# Patient Record
Sex: Female | Born: 1975 | Race: Black or African American | Hispanic: No | Smoking: Current every day smoker
Health system: Southern US, Community
[De-identification: ages and names within clinical notes are randomized; demographics above are authoritative.]

## PROBLEM LIST (undated history)

## (undated) DIAGNOSIS — Z8711 Personal history of peptic ulcer disease: Secondary | ICD-10-CM

## (undated) DIAGNOSIS — I639 Cerebral infarction, unspecified: Secondary | ICD-10-CM

## (undated) DIAGNOSIS — K219 Gastro-esophageal reflux disease without esophagitis: Secondary | ICD-10-CM

## (undated) DIAGNOSIS — G510 Bell's palsy: Secondary | ICD-10-CM

## (undated) DIAGNOSIS — U071 COVID-19: Secondary | ICD-10-CM

## (undated) HISTORY — PX: ABDOMINAL SURGERY: SHX537

---

## 2015-10-01 DIAGNOSIS — K922 Gastrointestinal hemorrhage, unspecified: Secondary | ICD-10-CM | POA: Diagnosis present

## 2020-04-02 ENCOUNTER — Encounter (HOSPITAL_COMMUNITY): Payer: Self-pay | Admitting: *Deleted

## 2020-04-02 ENCOUNTER — Inpatient Hospital Stay (HOSPITAL_COMMUNITY)
Admission: EM | Admit: 2020-04-02 | Discharge: 2020-04-04 | DRG: 384 | Discharge status: Against Medical Advice | Payer: Medicaid Other | Attending: Internal Medicine | Admitting: Internal Medicine

## 2020-04-02 ENCOUNTER — Other Ambulatory Visit: Payer: Self-pay

## 2020-04-02 ENCOUNTER — Emergency Department (HOSPITAL_COMMUNITY): Payer: Medicaid Other

## 2020-04-02 DIAGNOSIS — E876 Hypokalemia: Secondary | ICD-10-CM | POA: Diagnosis present

## 2020-04-02 DIAGNOSIS — Z79899 Other long term (current) drug therapy: Secondary | ICD-10-CM

## 2020-04-02 DIAGNOSIS — D539 Nutritional anemia, unspecified: Secondary | ICD-10-CM | POA: Diagnosis present

## 2020-04-02 DIAGNOSIS — K219 Gastro-esophageal reflux disease without esophagitis: Secondary | ICD-10-CM | POA: Diagnosis present

## 2020-04-02 DIAGNOSIS — E871 Hypo-osmolality and hyponatremia: Secondary | ICD-10-CM | POA: Diagnosis present

## 2020-04-02 DIAGNOSIS — K922 Gastrointestinal hemorrhage, unspecified: Secondary | ICD-10-CM | POA: Diagnosis present

## 2020-04-02 DIAGNOSIS — E869 Volume depletion, unspecified: Secondary | ICD-10-CM | POA: Diagnosis present

## 2020-04-02 DIAGNOSIS — Z7982 Long term (current) use of aspirin: Secondary | ICD-10-CM

## 2020-04-02 DIAGNOSIS — R531 Weakness: Secondary | ICD-10-CM

## 2020-04-02 DIAGNOSIS — Z8616 Personal history of COVID-19: Secondary | ICD-10-CM

## 2020-04-02 DIAGNOSIS — R112 Nausea with vomiting, unspecified: Secondary | ICD-10-CM

## 2020-04-02 DIAGNOSIS — F1410 Cocaine abuse, uncomplicated: Secondary | ICD-10-CM | POA: Diagnosis present

## 2020-04-02 DIAGNOSIS — Z8673 Personal history of transient ischemic attack (TIA), and cerebral infarction without residual deficits: Secondary | ICD-10-CM

## 2020-04-02 DIAGNOSIS — G510 Bell's palsy: Secondary | ICD-10-CM

## 2020-04-02 DIAGNOSIS — F17210 Nicotine dependence, cigarettes, uncomplicated: Secondary | ICD-10-CM | POA: Diagnosis present

## 2020-04-02 DIAGNOSIS — I639 Cerebral infarction, unspecified: Secondary | ICD-10-CM | POA: Diagnosis present

## 2020-04-02 DIAGNOSIS — Z791 Long term (current) use of non-steroidal anti-inflammatories (NSAID): Secondary | ICD-10-CM

## 2020-04-02 DIAGNOSIS — E873 Alkalosis: Secondary | ICD-10-CM | POA: Diagnosis present

## 2020-04-02 DIAGNOSIS — K259 Gastric ulcer, unspecified as acute or chronic, without hemorrhage or perforation: Principal | ICD-10-CM | POA: Diagnosis present

## 2020-04-02 DIAGNOSIS — R059 Cough, unspecified: Secondary | ICD-10-CM

## 2020-04-02 HISTORY — DX: Personal history of peptic ulcer disease: Z87.11

## 2020-04-02 HISTORY — DX: COVID-19: U07.1

## 2020-04-02 HISTORY — DX: Cerebral infarction, unspecified: I63.9

## 2020-04-02 HISTORY — DX: Gastro-esophageal reflux disease without esophagitis: K21.9

## 2020-04-02 HISTORY — DX: Bell's palsy: G51.0

## 2020-04-02 LAB — CBC WITH DIFFERENTIAL/PLATELET
Abs Immature Granulocytes: 0.04 10*3/uL (ref 0.00–0.07)
Basophils Absolute: 0 10*3/uL (ref 0.0–0.1)
Basophils Relative: 0 %
Eosinophils Absolute: 0.2 10*3/uL (ref 0.0–0.5)
Eosinophils Relative: 2 %
HCT: 45.4 % (ref 36.0–46.0)
Hemoglobin: 15.4 g/dL — ABNORMAL HIGH (ref 12.0–15.0)
Immature Granulocytes: 0 %
Lymphocytes Relative: 21 %
Lymphs Abs: 2 10*3/uL (ref 0.7–4.0)
MCH: 33.3 pg (ref 26.0–34.0)
MCHC: 33.9 g/dL (ref 30.0–36.0)
MCV: 98.1 fL (ref 80.0–100.0)
Monocytes Absolute: 1 10*3/uL (ref 0.1–1.0)
Monocytes Relative: 11 %
Neutro Abs: 6.2 10*3/uL (ref 1.7–7.7)
Neutrophils Relative %: 66 %
Platelets: 294 10*3/uL (ref 150–400)
RBC: 4.63 MIL/uL (ref 3.87–5.11)
RDW: 12.8 % (ref 11.5–15.5)
WBC: 9.5 10*3/uL (ref 4.0–10.5)
nRBC: 0 % (ref 0.0–0.2)

## 2020-04-02 LAB — COMPREHENSIVE METABOLIC PANEL
ALT: 13 U/L (ref 0–44)
AST: 19 U/L (ref 15–41)
Albumin: 4 g/dL (ref 3.5–5.0)
Alkaline Phosphatase: 119 U/L (ref 38–126)
Anion gap: 16 — ABNORMAL HIGH (ref 5–15)
BUN: 24 mg/dL — ABNORMAL HIGH (ref 6–20)
CO2: 39 mmol/L — ABNORMAL HIGH (ref 22–32)
Calcium: 9.7 mg/dL (ref 8.9–10.3)
Chloride: 78 mmol/L — ABNORMAL LOW (ref 98–111)
Creatinine, Ser: 1.34 mg/dL — ABNORMAL HIGH (ref 0.44–1.00)
GFR, Estimated: 50 mL/min — ABNORMAL LOW (ref >60)
Glucose, Bld: 130 mg/dL — ABNORMAL HIGH (ref 70–99)
Potassium: 2.2 mmol/L — CL (ref 3.5–5.1)
Sodium: 133 mmol/L — ABNORMAL LOW (ref 135–145)
Total Bilirubin: 0.2 mg/dL — ABNORMAL LOW (ref 0.3–1.2)
Total Protein: 8.2 g/dL — ABNORMAL HIGH (ref 6.5–8.1)

## 2020-04-02 LAB — MAGNESIUM: Magnesium: 1.7 mg/dL (ref 1.7–2.4)

## 2020-04-02 LAB — BASIC METABOLIC PANEL
Anion gap: 12 (ref 5–15)
BUN: 25 mg/dL — ABNORMAL HIGH (ref 6–20)
CO2: 38 mmol/L — ABNORMAL HIGH (ref 22–32)
Calcium: 9.4 mg/dL (ref 8.9–10.3)
Chloride: 83 mmol/L — ABNORMAL LOW (ref 98–111)
Creatinine, Ser: 1.29 mg/dL — ABNORMAL HIGH (ref 0.44–1.00)
GFR, Estimated: 52 mL/min — ABNORMAL LOW (ref >60)
Glucose, Bld: 100 mg/dL — ABNORMAL HIGH (ref 70–99)
Potassium: 3 mmol/L — ABNORMAL LOW (ref 3.5–5.1)
Sodium: 133 mmol/L — ABNORMAL LOW (ref 135–145)

## 2020-04-02 LAB — RESP PANEL BY RT-PCR (FLU A&B, COVID) ARPGX2
Influenza A by PCR: NEGATIVE
Influenza B by PCR: NEGATIVE
SARS Coronavirus 2 by RT PCR: NEGATIVE

## 2020-04-02 LAB — TROPONIN I (HIGH SENSITIVITY)
Troponin I (High Sensitivity): 4 ng/L (ref <18)
Troponin I (High Sensitivity): 3 ng/L (ref <18)

## 2020-04-02 MED ORDER — POTASSIUM CHLORIDE 10 MEQ/100ML IV SOLN
10.0000 meq | INTRAVENOUS | Status: AC
  Administered 2020-04-02 (×3): 10 meq via INTRAVENOUS
  Filled 2020-04-02 (×3): qty 100

## 2020-04-02 MED ORDER — LIDOCAINE VISCOUS HCL 2 % MT SOLN
15.0000 mL | Freq: Once | OROMUCOSAL | Status: AC
  Administered 2020-04-02: 15 mL via ORAL
  Filled 2020-04-02: qty 15

## 2020-04-02 MED ORDER — DICYCLOMINE HCL 10 MG PO CAPS
10.0000 mg | ORAL_CAPSULE | Freq: Once | ORAL | Status: AC
  Administered 2020-04-02: 10 mg via ORAL
  Filled 2020-04-02: qty 1

## 2020-04-02 MED ORDER — HYOSCYAMINE SULFATE 0.125 MG SL SUBL
0.2500 mg | SUBLINGUAL_TABLET | Freq: Once | SUBLINGUAL | Status: AC
  Administered 2020-04-02: 0.25 mg via SUBLINGUAL
  Filled 2020-04-02: qty 2

## 2020-04-02 MED ORDER — PANTOPRAZOLE SODIUM 40 MG IV SOLR
40.0000 mg | Freq: Two times a day (BID) | INTRAVENOUS | Status: DC
  Administered 2020-04-02 – 2020-04-04 (×4): 40 mg via INTRAVENOUS
  Filled 2020-04-02 (×4): qty 40

## 2020-04-02 MED ORDER — SODIUM CHLORIDE 0.9 % IV SOLN: INTRAVENOUS | Status: DC

## 2020-04-02 MED ORDER — ONDANSETRON HCL 4 MG/2ML IJ SOLN
4.0000 mg | Freq: Once | INTRAMUSCULAR | Status: AC
  Administered 2020-04-02: 4 mg via INTRAVENOUS
  Filled 2020-04-02: qty 2

## 2020-04-02 MED ORDER — ALUM & MAG HYDROXIDE-SIMETH 200-200-20 MG/5ML PO SUSP
30.0000 mL | Freq: Once | ORAL | Status: AC
  Administered 2020-04-02: 30 mL via ORAL
  Filled 2020-04-02: qty 30

## 2020-04-02 MED ORDER — POTASSIUM CHLORIDE CRYS ER 20 MEQ PO TBCR
40.0000 meq | EXTENDED_RELEASE_TABLET | Freq: Once | ORAL | Status: AC
  Administered 2020-04-02: 40 meq via ORAL
  Filled 2020-04-02: qty 2

## 2020-04-02 MED ORDER — ONDANSETRON HCL 4 MG/2ML IJ SOLN
4.0000 mg | Freq: Four times a day (QID) | INTRAMUSCULAR | Status: DC | PRN
  Administered 2020-04-02 – 2020-04-04 (×5): 4 mg via INTRAVENOUS
  Filled 2020-04-02 (×6): qty 2

## 2020-04-02 MED ORDER — ONDANSETRON HCL 4 MG PO TABS: 4.0000 mg | ORAL_TABLET | Freq: Four times a day (QID) | ORAL | Status: DC | PRN

## 2020-04-02 MED ORDER — PANTOPRAZOLE SODIUM 40 MG IV SOLR
40.0000 mg | Freq: Once | INTRAVENOUS | Status: AC
  Administered 2020-04-02: 40 mg via INTRAVENOUS
  Filled 2020-04-02: qty 40

## 2020-04-02 MED ORDER — DICYCLOMINE HCL 10 MG/5ML PO SOLN
10.0000 mg | Freq: Once | ORAL | Status: DC
  Filled 2020-04-02: qty 5

## 2020-04-02 MED ORDER — IOHEXOL 9 MG/ML PO SOLN
ORAL | Status: AC
  Filled 2020-04-02: qty 1000

## 2020-04-02 MED ORDER — PROMETHAZINE HCL 25 MG/ML IJ SOLN
25.0000 mg | Freq: Once | INTRAMUSCULAR | Status: AC
  Administered 2020-04-02: 25 mg via INTRAVENOUS
  Filled 2020-04-02: qty 1

## 2020-04-02 MED ORDER — ENOXAPARIN SODIUM 40 MG/0.4ML ~~LOC~~ SOLN: 40.0000 mg | SUBCUTANEOUS | Status: DC

## 2020-04-02 NOTE — H&P (Signed)
History and Physical  Kristina Cardenas GYJ:[id number] DOB: [birth year] DOA: 04/02/2020  Referring physician: Trisha Mangle, PA-C, ED provider PCP: Patient, No Pcp Per  Outpatient Specialists:   Patient Coming From: home  Chief Complaint: weakness, vomiting  HPI: Kristina Cardenas is a 44 y.o. female with a history of Stroke, PUD. Presents with generalized weakness, abdominal pain in the epigastric region that radiates into her back.  No palliating or provoking factors.  She has been vomiting for the past few days with the inability to take oral food or liquid.  Symptoms are worsening.  Emergency Department Course: Chest x-ray negative.  Potassium 2.2.  Magnesium normal.  Metabolic alkalosis.  White count normal  Review of Systems:   Pt denies any fevers, chills, nausea, vomiting, diarrhea, constipation, abdominal pain, shortness of breath, dyspnea on exertion, orthopnea, cough, wheezing, palpitations, headache, vision changes, lightheadedness, dizziness, melena, rectal bleeding.  Review of systems are otherwise negative  Past Medical History:  Diagnosis Date  . Bell's palsy   . COVID   . GERD (gastroesophageal reflux disease)   . History of stomach ulcers   . Stroke [facility])    Past Surgical History:  Procedure Laterality Date  . ABDOMINAL SURGERY     Social History:  reports that she has been smoking cigarettes. She has been smoking about 1.00 pack per day. She has never used smokeless tobacco. She reports previous alcohol use. She reports previous drug use. Patient lives at home  Allergies  Allergen Reactions  . Sulfa Antibiotics   . Toradol [Ketorolac Tromethamine]     No family history on file.    Prior to Admission medications   Not on File    Physical Exam: BP 108/84   Pulse 86   Temp 98.4 F (36.9 C) (Oral)   Resp (!) 21   Ht 5\' 8"  (1.727 m)   Wt 77.1 kg   LMP 03/12/2020   SpO2 100%   BMI 25.85 kg/m   . General: Middle-age female. Awake and alert and  oriented x3. No acute cardiopulmonary distress.  13/03/2020 HEENT: Normocephalic atraumatic.  Right and left ears normal in appearance.  Pupils equal, round, reactive to light. Extraocular muscles are intact. Sclerae anicteric and noninjected.  Moist mucosal membranes. No mucosal lesions.  . Neck: Neck supple without lymphadenopathy. No carotid bruits. No masses palpated.  . Cardiovascular: Regular rate with normal S1-S2 sounds. No murmurs, rubs, gallops auscultated. No JVD.  Marland Kitchen Respiratory: Good respiratory effort with no wheezes, rales, rhonchi. Lungs clear to auscultation bilaterally.  No accessory muscle use. . Abdomen: Soft, tender in the epigastric region or guarding, nondistended. Active bowel sounds. No masses or hepatosplenomegaly  . Skin: No rashes, lesions, or ulcerations.  Dry, warm to touch. 2+ dorsalis pedis and radial pulses. . Musculoskeletal: No calf or leg pain. All major joints not erythematous nontender.  No upper or lower joint deformation.  Good ROM.  No contractures  . Psychiatric: Intact judgment and insight. Pleasant and cooperative. . Neurologic: No focal neurological deficits. Strength is 5/5 and symmetric in upper and lower extremities.  Cranial nerves II through XII are grossly intact.           Labs on Admission: I have personally reviewed following labs and imaging studies  CBC: Recent Labs  Lab 04/02/20 1440  WBC 9.5  NEUTROABS 6.2  HGB 15.4*  HCT 45.4  MCV 98.1  PLT 294   Basic Metabolic Panel: Recent Labs  Lab 04/02/20 1440  NA 133*  K 2.2*  CL 78*  CO2 39*  GLUCOSE 130*  BUN 24*  CREATININE 1.34*  CALCIUM 9.7   GFR: Estimated Creatinine Clearance: 57.9 mL/min (A) (by C-G formula based on SCr of 1.34 mg/dL (H)). Liver Function Tests: Recent Labs  Lab 04/02/20 1440  AST 19  ALT 13  ALKPHOS 119  BILITOT 0.2*  PROT 8.2*  ALBUMIN 4.0   No results for input(s): LIPASE, AMYLASE in the last 168 hours. No results for input(s): AMMONIA in the last  168 hours. Coagulation Profile: No results for input(s): INR, PROTIME in the last 168 hours. Cardiac Enzymes: No results for input(s): CKTOTAL, CKMB, CKMBINDEX, TROPONINI in the last 168 hours. BNP (last 3 results) No results for input(s): PROBNP in the last 8760 hours. HbA1C: No results for input(s): HGBA1C in the last 72 hours. CBG: No results for input(s): GLUCAP in the last 168 hours. Lipid Profile: No results for input(s): CHOL, HDL, LDLCALC, TRIG, CHOLHDL, LDLDIRECT in the last 72 hours. Thyroid Function Tests: No results for input(s): TSH, T4TOTAL, FREET4, T3FREE, THYROIDAB in the last 72 hours. Anemia Panel: No results for input(s): VITAMINB12, FOLATE, FERRITIN, TIBC, IRON, RETICCTPCT in the last 72 hours. Urine analysis: No results found for: COLORURINE, APPEARANCEUR, LABSPEC, PHURINE, GLUCOSEU, HGBUR, BILIRUBINUR, KETONESUR, PROTEINUR, UROBILINOGEN, NITRITE, LEUKOCYTESUR Sepsis Labs: @LABRCNTIP (procalcitonin:4,lacticidven:4) ) Recent Results (from the past 240 hour(s))  Resp Panel by RT-PCR (Flu A&B, Covid) Nasopharyngeal Swab     Status: None   Collection Time: 04/02/20  2:40 PM   Specimen: Nasopharyngeal Swab; Nasopharyngeal(NP) swabs in vial transport medium  Result Value Ref Range Status   SARS Coronavirus 2 by RT PCR NEGATIVE NEGATIVE Final   Influenza A by PCR NEGATIVE NEGATIVE Final   Influenza B by PCR NEGATIVE NEGATIVE Final    Comment: Performed at [facility], [address], [place], [place]     Radiological Exams on Admission: DG Chest Port 1 View  Result Date: 04/02/2020 CLINICAL DATA:  Cough, abdominal pain, emesis EXAM: PORTABLE CHEST 1 VIEW COMPARISON:  None. FINDINGS: Single frontal view of the chest demonstrates an unremarkable cardiac silhouette. No airspace disease, effusion, or pneumothorax. Prior healed right rib fractures. Rounded metallic densities projecting over the left upper quadrant abdomen are nonspecific, and may lie on or  beneath the patient. IMPRESSION: 1. No acute intrathoracic process. Electronically Signed   By: 14/06/2019 M.D.   On: 04/02/2020 15:23    EKG: Independently reviewed.  Sinus rhythm with Q waves in the anterior leads.  No acute ST changes  Assessment/Plan: Principal Problem:   Acute hypokalemia Active Problems:   Bell's palsy   Stroke (HCC)   Gastroesophageal reflux disease   History of Upper GI bleed   Metabolic alkalosis    This patient was discussed with the ED physician, including pertinent vitals, physical exam findings, labs, and imaging.  We also discussed care given by the ED provider.  1. Acute hypokalemia secondary to nausea and vomiting a. We will replace potassium b. Observation c. Check potassium in the morning 2. History of stroke a.  3. History of upper GI bleed and GERD a. Protonix IV b. Clear fluids c. CT abdomen pelvis pending 4. Metabolic alkalosis secondary to nausea and vomiting a. IV hydration b. Check BMP c. Clear fluids as tolerated d. Zofran  DVT prophylaxis: Lovenox Consultants: None Code Status: Full code Family Communication: None Disposition Plan: Patient to return home following resolution   14/06/2019, DO

## 2020-04-02 NOTE — ED Provider Notes (Signed)
Received signout at beginning of shift, please see previous providers note for complete H&P. This is a 44 year old female with history of stomach ulcer who presents with abdominal pain. Her labs remarkable for hypokalemia with potassium of 2.2 currently receiving replenishment. Evidence of AKI with BUN 24, creatinine of 1.34. Chest x-ray unremarkable, Covid test is negative. Patient has been consulted by [facility] hospitalist and likely will need to be admitted, Dr. Adrian Blackwater request for an abdominal pelvic CT scan to be performed as patient has prior stomach perforation. We will follow up on CT results check to make sure patient is admitted appropriately.  8:48 PM Pt is being admitted by medicine  BP 115/85   Pulse 70   Temp 98.4 F (36.9 C) (Oral)   Resp 19   Ht 5\' 8"  (1.727 m)   Wt 77.1 kg   LMP 03/12/2020   SpO2 99%   BMI 25.85 kg/m   Results for orders placed or performed during the hospital encounter of 04/02/20  Resp Panel by RT-PCR (Flu A&B, Covid) Nasopharyngeal Swab   Specimen: Nasopharyngeal Swab; Nasopharyngeal(NP) swabs in vial transport medium  Result Value Ref Range   SARS Coronavirus 2 by RT PCR NEGATIVE NEGATIVE   Influenza A by PCR NEGATIVE NEGATIVE   Influenza B by PCR NEGATIVE NEGATIVE  CBC with Differential/Platelet  Result Value Ref Range   WBC 9.5 4.0 - 10.5 K/uL   RBC 4.63 3.87 - 5.11 MIL/uL   Hemoglobin 15.4 (H) 12.0 - 15.0 g/dL   HCT 14/03/21 36 - 46 %   MCV 98.1 80.0 - 100.0 fL   MCH 33.3 26.0 - 34.0 pg   MCHC 33.9 30.0 - 36.0 g/dL   RDW 59.1 63.8 - 46.6 %   Platelets 294 150 - 400 K/uL   nRBC 0.0 0.0 - 0.2 %   Neutrophils Relative % 66 %   Neutro Abs 6.2 1.7 - 7.7 K/uL   Lymphocytes Relative 21 %   Lymphs Abs 2.0 0.7 - 4.0 K/uL   Monocytes Relative 11 %   Monocytes Absolute 1.0 0.1 - 1.0 K/uL   Eosinophils Relative 2 %   Eosinophils Absolute 0.2 0.0 - 0.5 K/uL   Basophils Relative 0 %   Basophils Absolute 0.0 0.0 - 0.1 K/uL   Immature Granulocytes 0 %    Abs Immature Granulocytes 0.04 0.00 - 0.07 K/uL  Comprehensive metabolic panel  Result Value Ref Range   Sodium 133 (L) 135 - 145 mmol/L   Potassium 2.2 (LL) 3.5 - 5.1 mmol/L   Chloride 78 (L) 98 - 111 mmol/L   CO2 39 (H) 22 - 32 mmol/L   Glucose, Bld 130 (H) 70 - 99 mg/dL   BUN 24 (H) 6 - 20 mg/dL   Creatinine, Ser 59.9 (H) 0.44 - 1.00 mg/dL   Calcium 9.7 8.9 - 3.57 mg/dL   Total Protein 8.2 (H) 6.5 - 8.1 g/dL   Albumin 4.0 3.5 - 5.0 g/dL   AST 19 15 - 41 U/L   ALT 13 0 - 44 U/L   Alkaline Phosphatase 119 38 - 126 U/L   Total Bilirubin 0.2 (L) 0.3 - 1.2 mg/dL   GFR, Estimated 50 (L) >60 mL/min   Anion gap 16 (H) 5 - 15  Magnesium  Result Value Ref Range   Magnesium 1.7 1.7 - 2.4 mg/dL  Troponin I (High Sensitivity)  Result Value Ref Range   Troponin I (High Sensitivity) 4 <18 ng/L  Troponin I (High Sensitivity)  Result Value  Ref Range   Troponin I (High Sensitivity) 3 <18 ng/L   DG Chest Port 1 View  Result Date: 04/02/2020 CLINICAL DATA:  Cough, abdominal pain, emesis EXAM: PORTABLE CHEST 1 VIEW COMPARISON:  None. FINDINGS: Single frontal view of the chest demonstrates an unremarkable cardiac silhouette. No airspace disease, effusion, or pneumothorax. Prior healed right rib fractures. Rounded metallic densities projecting over the left upper quadrant abdomen are nonspecific, and may lie on or beneath the patient. IMPRESSION: 1. No acute intrathoracic process. Electronically Signed   By: Sharlet Salina M.D.   On: 04/02/2020 15:23      Fayrene Helper, PA-C 04/02/20 2048    Benjiman Core, MD 04/03/20 0030

## 2020-04-02 NOTE — ED Notes (Addendum)
This RN spoke to [place], patient's significant other. This RN was educated by staff that Loraine Leriche persistently calls department requesting an update on plan of care. While this RN was speaking with Loraine Leriche, this RN educated that patient was currently sleeping and visitors are not currently allowed due to pending COVID test. Loraine Leriche became upset and began to state " No. What y'all need to do is check out her stomach and run some labs to figure out what's going on. She doesn't have COVID cause I've been around here. I know what COVID is." This RN attempted to provide an update when Loraine Leriche began more irate and stated "I'm telling you what you need to do. And I don't know why you aren't bothering to listen. I know her and I know exactly what's wrong with her." This RN then educated that no further updates will be provided should he continue to be aggressive over the phone. Mark then hung up phone and pt educated that he called and to provide an update at patient request.

## 2020-04-02 NOTE — ED Triage Notes (Signed)
Multiple complaints, states she feels like she has pneumonia. C/o abdominal pain and emesis, out of protonix for weeks

## 2020-04-02 NOTE — ED Notes (Signed)
Entered room and introduced self to patient. Pt appears to be resting in bed, respirations are even and unlabored with equal chest rise and fall. Bed is locked in the lowest position, side rails x2, call bell within reach. Pt educated on call light use and hourly rounding, verbalized understanding and in agreement at this time. All questions and concerns voiced addressed. Refreshments offered and provided per patient request.  Will continue to monitor.   

## 2020-04-02 NOTE — ED Provider Notes (Signed)
[facility] EMERGENCY DEPARTMENT Provider Note   CSN: [id number] Arrival date & time: 04/02/20  1258     History Chief Complaint  Patient presents with  . Abdominal Pain  . Cough    Kristina Cardenas is a 44 y.o. female.  The history is provided by the patient. No language interpreter was used.  Cough Cough characteristics:  Non-productive Sputum characteristics:  Nondescript Severity:  Moderate Onset quality:  Gradual Timing:  Constant Progression:  Worsening Chronicity:  New Relieved by:  Nothing Ineffective treatments:  None tried Associated symptoms: shortness of breath   Risk factors: no recent infection   Pt complains of epigastric discomfort and cough.  Pt reports she has an ulcer and has had perforated ulcers in the past.  Pt reports she is out of protonix.  Pt complains of feeling tired.  Pt reports she felt like this the last time she had pneumonia.  Pt reports she had covid a year ago.  Pt reports she has been vomiting.  Pt reports not eating.     Past Medical History:  Diagnosis Date  . Bell's palsy   . COVID   . History of stomach ulcers   . Stroke [facility])     Patient Active Problem List   Diagnosis Date Noted  . Bell's palsy     Past Surgical History:  Procedure Laterality Date  . ABDOMINAL SURGERY       OB History   No obstetric history on file.     No family history on file.  Social History   Tobacco Use  . Smoking status: Current Every Day Smoker    Packs/day: 1.00    Types: Cigarettes  . Smokeless tobacco: Never Used  Substance Use Topics  . Alcohol use: Not Currently  . Drug use: Not Currently    Home Medications Prior to Admission medications   Not on File    Allergies    Sulfa antibiotics and Toradol [ketorolac tromethamine]  Review of Systems   Review of Systems  Respiratory: Positive for cough and shortness of breath.   All other systems reviewed and are negative.   Physical Exam Updated Vital Signs BP 118/80 (BP  Location: Left Arm)   Pulse 83   Temp 98.4 F (36.9 C) (Oral)   Resp 19   Ht 5\' 8"  (1.727 m)   Wt 77.1 kg   LMP 03/12/2020   SpO2 97%   BMI 25.85 kg/m   Physical Exam Vitals and nursing note reviewed.  Constitutional:      Appearance: She is well-developed.  HENT:     Head: Normocephalic.  Cardiovascular:     Rate and Rhythm: Normal rate and regular rhythm.     Heart sounds: Normal heart sounds.  Pulmonary:     Effort: Pulmonary effort is normal.  Abdominal:     General: Abdomen is flat. Bowel sounds are normal. There is no distension.     Palpations: Abdomen is soft.     Tenderness: There is abdominal tenderness in the epigastric area.  Musculoskeletal:        General: Normal range of motion.     Cervical back: Normal range of motion.  Skin:    General: Skin is warm.  Neurological:     General: No focal deficit present.     Mental Status: She is alert and oriented to person, place, and time.     ED Results / Procedures / Treatments   Labs (all labs ordered are listed, but only  abnormal results are displayed) Labs Reviewed  CBC WITH DIFFERENTIAL/PLATELET - Abnormal; Notable for the following components:      Result Value   Hemoglobin 15.4 (*)    All other components within normal limits  COMPREHENSIVE METABOLIC PANEL - Abnormal; Notable for the following components:   Sodium 133 (*)    Potassium 2.2 (*)    Chloride 78 (*)    CO2 39 (*)    Glucose, Bld 130 (*)    BUN 24 (*)    Creatinine, Ser 1.34 (*)    Total Protein 8.2 (*)    Total Bilirubin 0.2 (*)    GFR, Estimated 50 (*)    Anion gap 16 (*)    All other components within normal limits  RESP PANEL BY RT-PCR (FLU A&B, COVID) [id number]  TROPONIN I (HIGH SENSITIVITY)    EKG EKG Interpretation  Date/Time:  Friday April 02 2020 13:07:20 EST Ventricular Rate:  113 PR Interval:  144 QRS Duration: 96 QT Interval:  360 QTC Calculation: 493 R Axis:   81 Text Interpretation: Sinus tachycardia Cannot  rule out Anterior infarct , age undetermined ST & T wave abnormality, consider inferior ischemia Abnormal ECG No old tracing to compare Confirmed by Benjiman Core [phone]) on 04/02/2020 4:38:37 PM   Radiology DG Chest Port 1 View  Result Date: 04/02/2020 CLINICAL DATA:  Cough, abdominal pain, emesis EXAM: PORTABLE CHEST 1 VIEW COMPARISON:  None. FINDINGS: Single frontal view of the chest demonstrates an unremarkable cardiac silhouette. No airspace disease, effusion, or pneumothorax. Prior healed right rib fractures. Rounded metallic densities projecting over the left upper quadrant abdomen are nonspecific, and may lie on or beneath the patient. IMPRESSION: 1. No acute intrathoracic process. Electronically Signed   By: Sharlet Salina M.D.   On: 04/02/2020 15:23    Procedures Procedures (including critical care time)  Medications Ordered in ED Medications  potassium chloride 10 mEq in 100 mL IVPB (10 mEq Intravenous New Bag/Given 04/02/20 1658)  potassium chloride SA (KLOR-CON) CR tablet 40 mEq (40 mEq Oral Given 04/02/20 1701)  pantoprazole (PROTONIX) injection 40 mg (40 mg Intravenous Given 04/02/20 1638)  ondansetron (ZOFRAN) injection 4 mg (4 mg Intravenous Given 04/02/20 1650)    ED Course  I have reviewed the triage vital signs and the nursing notes.  Pertinent labs & imaging results that were available during my care of the patient were reviewed by me and considered in my medical decision making (see chart for details).    MDM Rules/Calculators/A&P                          MDM Pt request iv protonix and phenergan  I spoke to Dr. Adrian Blackwater who will admit.  Iv potassium ordered.  Ct abdomen and pelvis ordered  Final Clinical Impression(s) / ED Diagnoses Final diagnoses:  Hypokalemia  Weakness    Rx / DC Orders ED Discharge Orders    None       Osie Cheeks 04/02/20 Raina Mina, MD 04/03/20 [phone]

## 2020-04-02 NOTE — ED Notes (Signed)
Lab at bedside at this time.  

## 2020-04-02 NOTE — ED Notes (Signed)
Pt provided a pillow per request at this time.  X-Ray at bedside.

## 2020-04-02 NOTE — ED Notes (Signed)
ED Provider at bedside. 

## 2020-04-03 ENCOUNTER — Other Ambulatory Visit: Payer: Self-pay

## 2020-04-03 DIAGNOSIS — K253 Acute gastric ulcer without hemorrhage or perforation: Secondary | ICD-10-CM

## 2020-04-03 DIAGNOSIS — K219 Gastro-esophageal reflux disease without esophagitis: Secondary | ICD-10-CM

## 2020-04-03 DIAGNOSIS — R1013 Epigastric pain: Secondary | ICD-10-CM

## 2020-04-03 DIAGNOSIS — R112 Nausea with vomiting, unspecified: Secondary | ICD-10-CM | POA: Diagnosis present

## 2020-04-03 DIAGNOSIS — Z791 Long term (current) use of non-steroidal anti-inflammatories (NSAID): Secondary | ICD-10-CM | POA: Diagnosis not present

## 2020-04-03 DIAGNOSIS — K259 Gastric ulcer, unspecified as acute or chronic, without hemorrhage or perforation: Secondary | ICD-10-CM | POA: Diagnosis present

## 2020-04-03 DIAGNOSIS — Z8673 Personal history of transient ischemic attack (TIA), and cerebral infarction without residual deficits: Secondary | ICD-10-CM | POA: Diagnosis not present

## 2020-04-03 DIAGNOSIS — Z7982 Long term (current) use of aspirin: Secondary | ICD-10-CM | POA: Diagnosis not present

## 2020-04-03 DIAGNOSIS — D539 Nutritional anemia, unspecified: Secondary | ICD-10-CM | POA: Diagnosis present

## 2020-04-03 DIAGNOSIS — F17210 Nicotine dependence, cigarettes, uncomplicated: Secondary | ICD-10-CM | POA: Diagnosis present

## 2020-04-03 DIAGNOSIS — E873 Alkalosis: Secondary | ICD-10-CM | POA: Diagnosis present

## 2020-04-03 DIAGNOSIS — K2970 Gastritis, unspecified, without bleeding: Secondary | ICD-10-CM

## 2020-04-03 DIAGNOSIS — E876 Hypokalemia: Secondary | ICD-10-CM

## 2020-04-03 DIAGNOSIS — R1114 Bilious vomiting: Secondary | ICD-10-CM

## 2020-04-03 DIAGNOSIS — F1410 Cocaine abuse, uncomplicated: Secondary | ICD-10-CM | POA: Diagnosis present

## 2020-04-03 DIAGNOSIS — E871 Hypo-osmolality and hyponatremia: Secondary | ICD-10-CM | POA: Diagnosis present

## 2020-04-03 DIAGNOSIS — Z79899 Other long term (current) drug therapy: Secondary | ICD-10-CM | POA: Diagnosis not present

## 2020-04-03 DIAGNOSIS — K2990 Gastroduodenitis, unspecified, without bleeding: Secondary | ICD-10-CM

## 2020-04-03 DIAGNOSIS — E869 Volume depletion, unspecified: Secondary | ICD-10-CM | POA: Diagnosis present

## 2020-04-03 DIAGNOSIS — Z8616 Personal history of COVID-19: Secondary | ICD-10-CM | POA: Diagnosis not present

## 2020-04-03 LAB — HIV ANTIBODY (ROUTINE TESTING W REFLEX): HIV Screen 4th Generation wRfx: NONREACTIVE

## 2020-04-03 LAB — BASIC METABOLIC PANEL
Anion gap: 10 (ref 5–15)
Anion gap: 8 (ref 5–15)
BUN: 17 mg/dL (ref 6–20)
BUN: 13 mg/dL (ref 6–20)
CO2: 36 mmol/L — ABNORMAL HIGH (ref 22–32)
CO2: 29 mmol/L (ref 22–32)
Calcium: 8.7 mg/dL — ABNORMAL LOW (ref 8.9–10.3)
Calcium: 7.7 mg/dL — ABNORMAL LOW (ref 8.9–10.3)
Chloride: 89 mmol/L — ABNORMAL LOW (ref 98–111)
Chloride: 97 mmol/L — ABNORMAL LOW (ref 98–111)
Creatinine, Ser: 0.89 mg/dL (ref 0.44–1.00)
Creatinine, Ser: 0.75 mg/dL (ref 0.44–1.00)
GFR, Estimated: >60 mL/min (ref >60)
GFR, Estimated: >60 mL/min (ref >60)
Glucose, Bld: 102 mg/dL — ABNORMAL HIGH (ref 70–99)
Glucose, Bld: 117 mg/dL — ABNORMAL HIGH (ref 70–99)
Potassium: 2.1 mmol/L — CL (ref 3.5–5.1)
Potassium: 2.8 mmol/L — ABNORMAL LOW (ref 3.5–5.1)
Sodium: 135 mmol/L (ref 135–145)
Sodium: 134 mmol/L — ABNORMAL LOW (ref 135–145)

## 2020-04-03 LAB — RAPID URINE DRUG SCREEN, HOSP PERFORMED
Amphetamines: NOT DETECTED
Barbiturates: NOT DETECTED
Benzodiazepines: NOT DETECTED
Cocaine: POSITIVE — AB
Opiates: NOT DETECTED
Tetrahydrocannabinol: NOT DETECTED

## 2020-04-03 LAB — MAGNESIUM: Magnesium: 1.7 mg/dL (ref 1.7–2.4)

## 2020-04-03 MED ORDER — PROCHLORPERAZINE EDISYLATE 10 MG/2ML IJ SOLN
10.0000 mg | Freq: Once | INTRAMUSCULAR | Status: AC
  Administered 2020-04-03: 10 mg via INTRAVENOUS
  Filled 2020-04-03: qty 2

## 2020-04-03 MED ORDER — POTASSIUM CHLORIDE IN NACL 20-0.9 MEQ/L-% IV SOLN: INTRAVENOUS | Status: DC

## 2020-04-03 MED ORDER — POTASSIUM CHLORIDE CRYS ER 20 MEQ PO TBCR: 30.0000 meq | EXTENDED_RELEASE_TABLET | Freq: Once | ORAL | Status: DC

## 2020-04-03 MED ORDER — MORPHINE SULFATE (PF) 2 MG/ML IV SOLN: 1.0000 mg | INTRAVENOUS | Status: DC | PRN

## 2020-04-03 MED ORDER — OXYCODONE-ACETAMINOPHEN 5-325 MG PO TABS
1.0000 | ORAL_TABLET | ORAL | Status: DC | PRN
  Administered 2020-04-03 – 2020-04-04 (×6): 1 via ORAL
  Filled 2020-04-03 (×6): qty 1

## 2020-04-03 MED ORDER — ALUM & MAG HYDROXIDE-SIMETH 200-200-20 MG/5ML PO SUSP
30.0000 mL | Freq: Four times a day (QID) | ORAL | Status: DC | PRN
  Administered 2020-04-03: 30 mL via ORAL
  Filled 2020-04-03 (×2): qty 30

## 2020-04-03 MED ORDER — POTASSIUM CHLORIDE 10 MEQ/100ML IV SOLN
10.0000 meq | INTRAVENOUS | Status: AC
  Administered 2020-04-03 (×3): 10 meq via INTRAVENOUS
  Filled 2020-04-03 (×3): qty 100

## 2020-04-03 MED ORDER — MAGNESIUM SULFATE 4 GM/100ML IV SOLN
4.0000 g | Freq: Once | INTRAVENOUS | Status: AC
  Administered 2020-04-03: 4 g via INTRAVENOUS
  Filled 2020-04-03: qty 100

## 2020-04-03 MED ORDER — POTASSIUM CHLORIDE CRYS ER 20 MEQ PO TBCR
40.0000 meq | EXTENDED_RELEASE_TABLET | Freq: Once | ORAL | Status: AC
  Administered 2020-04-03: 40 meq via ORAL
  Filled 2020-04-03: qty 2

## 2020-04-03 NOTE — Progress Notes (Signed)
CRITICAL VALUE ALERT  Critical Value:  Potassium 2.1  Date & Time Notied:  04/03/2020 0725  Provider Notified: Dr. Laural Benes   Orders Received/Actions taken: No new orders at this time

## 2020-04-03 NOTE — Progress Notes (Signed)
[facility] night shift.  The patient stated to the staff that she was still nauseous despite being given ondansetron earlier.  A one-time dose of prochlorperazine 10 mg IVP was ordered.  Sanda Klein, MD

## 2020-04-03 NOTE — Progress Notes (Signed)
Telemetry called stating patients was on telemetry with no order and stated QTC was 500.  This nurse paged Dr. Laural Benes and asked if he wanted to place order for telemetry. Dr Laural Benes stated patient was medsurg and didn't need to be on telemetry. Telemetry monitor taken off of patient. Will continue to assess throughout shift.

## 2020-04-03 NOTE — Progress Notes (Signed)
PROGRESS NOTE   Kristina Cardenas  XLK:[id number] DOB: [birth year] DOA: 04/02/2020 PCP: Patient, No Pcp Per   Chief Complaint  Patient presents with  . Abdominal Pain  . Cough   Brief Admission History:  44 y.o. female with a history of Stroke, PUD. Presents with generalized weakness, abdominal pain in the epigastric region that radiates into her back.  No palliating or provoking factors.  She has been vomiting for the past few days with the inability to take oral food or liquid.  Symptoms are worsening.   The patient has a history of prior peptic ulcer disease and reports that she has had a perforated ulcer several years ago requiring surgical management.  She reports that she has been taking Goody Powder and some other "headache" medicine but denies taking ibuprofen or aleve.  She reports she has epigastric abdominal pain.    Assessment & Plan:   Principal Problem:   Acute hypokalemia Active Problems:   Bell's palsy   Stroke (HCC)   Gastroesophageal reflux disease   History of Upper GI bleed   Metabolic alkalosis   Gastric ulcer  1. Gastric ulceration - Reported on CT abdomen.  Pt reports that she has had several ulcers in the past and reports that she has had a perforation requiring surgical management several years ago.  Continue IV protonix 40 mg BID. Avoid NSAIDS.  GI consult appreciated.  2. Hypokalemia - secondary to nausea and vomiting and poor oral intake.  Oral and IV replacement ordered. IV magnesium ordered.  3. GERD - IV protonix ordered for GI protection.  4. Epigastric abdominal pain - secondary to gastric ulceration.  Pain medication ordered.  CT abdomen reviewed and suggesting gastric ulceration.   DVT prophylaxis: enoxaparin Code Status:  Full  Family Communication: plan of care with patient discussed at bedside  Disposition:   Status is: Observation  The patient remains OBS appropriate and will d/c before 2 midnights.  Dispo: The patient is from: Home               Anticipated d/c is to: Home              Anticipated d/c date is: 1 day              Patient currently is not medically stable to d/c.  Consultants:   GI   Procedures:   EGD tentatively planned for 12/5  Antimicrobials:  n/a   Subjective: Pt reports that she is having abdominal pain but the nausea is better this morning.  She is tolerating clear liquids at this time.   Objective: Vitals:   04/02/20 2054 04/02/20 2058 04/03/20 0000 04/03/20 0403  BP:  (!) 113/96 110/80 114/71  Pulse:  88 76 74  Resp:  19 19 18   Temp:  98.4 F (36.9 C) 98.3 F (36.8 C) 98.8 F (37.1 C)  TempSrc:      SpO2: 99% 99% 98% 97%  Weight:      Height:        Intake/Output Summary (Last 24 hours) at 04/03/2020 1054 Last data filed at 04/02/2020 2021 Gross per 24 hour  Intake 200 ml  Output --  Net 200 ml   Filed Weights   04/02/20 1701  Weight: 77.1 kg    Examination:  General exam: Appears calm and comfortable, no distress.   Respiratory system: Clear to auscultation. Respiratory effort normal. Cardiovascular system: normal S1 & S2 heard, RRR. No JVD, murmurs, rubs, gallops or clicks. No pedal  edema. Gastrointestinal system: Abdomen is nondistended, soft and nontender. No organomegaly or masses felt. Normal bowel sounds heard. Central nervous system: Alert and oriented. No focal neurological deficits. Extremities: Symmetric 5 x 5 power. Skin: No rashes, lesions or ulcers Psychiatry: Judgement and insight appear normal. Mood & affect appropriate.   Data Reviewed: I have personally reviewed following labs and imaging studies  CBC: Recent Labs  Lab 04/02/20 1440  WBC 9.5  NEUTROABS 6.2  HGB 15.4*  HCT 45.4  MCV 98.1  PLT 294    Basic Metabolic Panel: Recent Labs  Lab 04/02/20 1440 04/02/20 1853 04/03/20 0551  NA 133* 133* 135  K 2.2* 3.0* 2.1*  CL 78* 83* 89*  CO2 39* 38* 36*  GLUCOSE 130* 100* 102*  BUN 24* 25* 17  CREATININE 1.34* 1.29* 0.89  CALCIUM 9.7  9.4 8.7*  MG  --  1.7 1.7    GFR: Estimated Creatinine Clearance: 87.2 mL/min (by C-G formula based on SCr of 0.89 mg/dL).  Liver Function Tests: Recent Labs  Lab 04/02/20 1440  AST 19  ALT 13  ALKPHOS 119  BILITOT 0.2*  PROT 8.2*  ALBUMIN 4.0    CBG: No results for input(s): GLUCAP in the last 168 hours.  Recent Results (from the past 240 hour(s))  Resp Panel by RT-PCR (Flu A&B, Covid) Nasopharyngeal Swab     Status: None   Collection Time: 04/02/20  2:40 PM   Specimen: Nasopharyngeal Swab; Nasopharyngeal(NP) swabs in vial transport medium  Result Value Ref Range Status   SARS Coronavirus 2 by RT PCR NEGATIVE NEGATIVE Final   Influenza A by PCR NEGATIVE NEGATIVE Final   Influenza B by PCR NEGATIVE NEGATIVE Final    Comment: Performed at [facility], [address] [place], [place] [address]     Radiology Studies: CT ABDOMEN PELVIS WO CONTRAST  Result Date: 04/02/2020 CLINICAL DATA:  44 year old female with abdominal pain. EXAM: CT ABDOMEN AND PELVIS WITHOUT CONTRAST TECHNIQUE: Multidetector CT imaging of the abdomen and pelvis was performed following the standard protocol without IV contrast. COMPARISON:  None. FINDINGS: Evaluation of this exam is limited in the absence of intravenous contrast. Lower chest: The visualized lung bases are clear. No intra-abdominal free air or free fluid. Hepatobiliary: The liver is unremarkable. No intrahepatic biliary dilatation. Mild thickened appearance of the gallbladder wall. No calcified gallstone or pericholecystic fluid. Pancreas: Unremarkable. No pancreatic ductal dilatation or surrounding inflammatory changes. Spleen: Normal in size without focal abnormality. Adrenals/Urinary Tract: The adrenal glands unremarkable. Multiple nonobstructing bilateral renal calculi measure up to 5 mm in the inferior pole of the left kidney. There is no hydronephrosis on either side. The visualized ureters and urinary bladder appear unremarkable.  Stomach/Bowel: There is inflammatory changes and thickening of the distal stomach in the region of the pylorus concerning for underlying gastric ulcer. There is apparent area of outpouching from the posterior aspect of the distal stomach (28/2 and 70/6) likely represents an ulcer. Clinical correlation and follow-up with endoscopy after resolution of active inflammation recommended to exclude underlying malignancy. Several mildly rounded adjacent lymph nodes, likely reactive. There is thickening of the ascending and proximal transverse colon, likely related to underdistention. Moderate stool noted in the distal colon. There is no bowel obstruction. The appendix is normal. Vascular/Lymphatic: Moderate aortoiliac atherosclerotic disease. The IVC is unremarkable. No portal venous gas. There is no adenopathy. Reproductive: The uterus and ovaries are grossly unremarkable. No pelvic mass. Other: None Musculoskeletal: Degenerative changes of the spine. No acute osseous  pathology. Bilateral L5 pars defects with grade 1 L5-S1 anterolisthesis. IMPRESSION: 1. Inflammatory changes and thickening of the distal stomach in the region of the pylorus concerning for underlying gastric ulcer. Clinical correlation and follow-up with endoscopy after resolution of active inflammation recommended to exclude underlying malignancy. 2. Nonobstructing bilateral renal calculi. No hydronephrosis. 3. No bowel obstruction. Normal appendix. 4. Aortic Atherosclerosis (ICD10-I70.0). Electronically Signed   By: Elgie Collard M.D.   On: 04/02/2020 23:41   DG Chest Port 1 View  Result Date: 04/02/2020 CLINICAL DATA:  Cough, abdominal pain, emesis EXAM: PORTABLE CHEST 1 VIEW COMPARISON:  None. FINDINGS: Single frontal view of the chest demonstrates an unremarkable cardiac silhouette. No airspace disease, effusion, or pneumothorax. Prior healed right rib fractures. Rounded metallic densities projecting over the left upper quadrant abdomen are  nonspecific, and may lie on or beneath the patient. IMPRESSION: 1. No acute intrathoracic process. Electronically Signed   By: Sharlet Salina M.D.   On: 04/02/2020 15:23     Scheduled Meds: . enoxaparin (LOVENOX) injection  40 mg Subcutaneous Q24H  . pantoprazole (PROTONIX) IV  40 mg Intravenous Q12H  . potassium chloride  30 mEq Oral Once   Continuous Infusions: . sodium chloride    . potassium chloride 10 mEq (04/03/20 1050)     LOS: 0 days   Time spent: 36 minutes    Jaevian Shean Laural Benes, MD How to contact the [facility] Attending or Consulting provider 7A - 7P or covering provider during after hours 7P -7A, for this patient?  1. Check the care team in [facility] and look for a) attending/consulting [facility] provider listed and b) the [facility] team listed 2. Log into [email or web address] and use [place]'s universal password to access. If you do not have the password, please contact the hospital operator. 3. Locate the [facility] provider you are looking for under [facility] and page to a number that you can be directly reached. 4. If you still have difficulty reaching the provider, please page the [facility] (Director on Call) for the Hospitalists listed on amion for assistance.  04/03/2020, 10:54 AM

## 2020-04-03 NOTE — Consult Note (Signed)
Maylon Peppers, M.D. Gastroenterology & Hepatology                                           Patient Name: Kristina Cardenas  MRN: [id number] Admission Date: 04/02/2020 Date of Evaluation:  04/03/2020 Time of Evaluation: 9:21 AM   Referring Physician: Irwin Brakeman, MD  Chief Complaint:  Epigastric pain, vomiting  HPI:  This is a 44 y.o. female with history of GERD, stroke and history of gastric ulcers complicated by perforation requiring ex lap in the past, who comes to the hospital after presenting with epigastric pain, nausea and vomiting.    Patient states that for the last 2 weeks she has presented worsening nausea and vomiting episodes, she reports vomiting up to 5 times per day, along with persistent burning epigastric abdominal pain that radiated to her left lower quadrant.  The patient states that the pain gets worse when she is eating or drinking but is present all the time regardless.  States that she has vomited bilious contents but has also presented scant amount of black coffee-ground emesis.  She reported that her mouth has metallic taste after she vomits.  Has also presented some lightheadedness and weakness but denies having any syncope, the symptoms have been present for last 3 days. Patient has not taken Protonix for the last 5 weeks as she ran out of the medication. She has been on Protonix 40 mg BID since 2017 after she was diagnosed with a gastric ulcer for the first time. She has been on phenergan since 2017 as well. Patient states that she takes [place] powders 12-15 times in a month. Also takes Headache relief (tylenol and aspirin 250 mg) twice a day. Has lost 15 lb in last 3 weeks. The patient denies having any fever, chills, hematochezia, melena, hematemesis, diarrhea, jaundice, pruritus.  Upon review of her medical record, the patient was admitted to [facility] clinic in [place] on June 2017 after presenting abdominal pain and melena.  The patient underwent an EGD on  10/01/2015, was found to have at 3 cm ulcer in the duodenal bulb with presence of a visible vessel.  This was treated with epinephrine injection and Nabisco clip was placed.  Patient states that a year ago she was seen again at [place] when she presented with draining epigastric abdominal pain was found to have a perforated gastric ulcer, for which she underwent surgical management.   In the ED, she was tachycardic to 106 bpm  but otherwise HD stable and afebrile. Labs were remarkable for CBC with hemoglobin 15.4, normal white blood cell count 9.5 and platelets 294, potassium was 2.2, sodium 133, chloride 78, CO2 39, BUN 24, creatinine 1.34, aminotransferases were normal, as well as normal albumin of 4.0, total bilirubin 0.2 and platelet is 119. She underwent CT of the abdomen pelvis without IV contrast that showed inflammation and thickening of the distal stomach concerning for possible gastric ulcer, presence of a nonobstructing bilateral renal calculi without any other alterations.  Last Colonoscopy: never  FHx: neg for any gastrointestinal/liver disease,  brother pancreatic cancer died last year Social: smokes a pack a day, neg alcohol or illicit drug use Surgical: ex-lap   past Medical History: SEE CHRONIC ISSSUES: Past Medical History:  Diagnosis Date  . Bell's palsy   . COVID   . GERD (gastroesophageal reflux disease)   . History of stomach ulcers   .  Stroke [facility])    Past Surgical History:  Past Surgical History:  Procedure Laterality Date  . ABDOMINAL SURGERY     Family History: No family history on file. Social History:  Social History   Tobacco Use  . Smoking status: Current Every Day Smoker    Packs/day: 1.00    Types: Cigarettes  . Smokeless tobacco: Never Used  Substance Use Topics  . Alcohol use: Not Currently  . Drug use: Not Currently    Home Medications:  Prior to Admission medications   Medication Sig Start Date End Date Taking? Authorizing Provider   HYDROcodone-acetaminophen (NORCO) 10-325 MG tablet Take 1 tablet by mouth every 8 (eight) hours as needed. 03/08/20  Yes [provider]  pantoprazole (PROTONIX) 40 MG tablet Take 40 mg by mouth daily. 12/30/19  Yes [provider]  promethazine (PHENERGAN) 25 MG tablet Take 25 mg by mouth every 8 (eight) hours as needed.  03/08/20  Yes [provider]    Inpatient Medications:  Current Facility-Administered Medications:  .  0.9 %  sodium chloride infusion, , Intravenous, Continuous, Stinson, Tanna Savoy, DO .  alum & mag hydroxide-simeth (MAALOX/MYLANTA) 200-200-20 MG/5ML suspension 30 mL, 30 mL, Oral, Q6H PRN, Truett Mainland, DO .  enoxaparin (LOVENOX) injection 40 mg, 40 mg, Subcutaneous, Q24H, Stinson, Jacob J, DO .  ondansetron (ZOFRAN) tablet 4 mg, 4 mg, Oral, Q6H PRN **OR** ondansetron (ZOFRAN) injection 4 mg, 4 mg, Intravenous, Q6H PRN, Truett Mainland, DO, 4 mg at 04/03/20 0816 .  pantoprazole (PROTONIX) injection 40 mg, 40 mg, Intravenous, Q12H, Truett Mainland, DO, 40 mg at 04/03/20 5465 .  potassium chloride 10 mEq in 100 mL IVPB, 10 mEq, Intravenous, Q1 Hr x 3, Johnson, Clanford L, MD, Last Rate: 100 mL/hr at 04/03/20 0812, 10 mEq at 04/03/20 6812 Allergies: Sulfa antibiotics and Toradol [ketorolac tromethamine]  Complete Review of Systems: GENERAL: negative for malaise, night sweats HEENT: No changes in hearing or vision, no nose bleeds or other nasal problems. NECK: Negative for lumps, goiter, pain and significant neck swelling RESPIRATORY: Negative for cough, wheezing CARDIOVASCULAR: Negative for chest pain, leg swelling, palpitations, orthopnea GI: SEE HPI MUSCULOSKELETAL: Negative for joint pain or swelling, back pain, and muscle pain. SKIN: Negative for lesions, rash PSYCH: Negative for sleep disturbance, mood disorder and recent psychosocial stressors. HEMATOLOGY Negative for prolonged bleeding, bruising easily, and swollen nodes. ENDOCRINE:  Negative for cold or heat intolerance, polyuria, polydipsia and goiter. NEURO: negative for tremor, gait imbalance, syncope and seizures. The remainder of the review of systems is noncontributory.  Physical Exam: BP 114/71 (BP Location: Left Arm)   Pulse 74   Temp 98.8 F (37.1 C)   Resp 18   Ht $R'5\' 8"'[place]$  (1.727 m)   Wt 77.1 kg   LMP 03/12/2020   SpO2 97%   BMI 25.85 kg/m  GENERAL: The patient is AO x3, in no acute distress. HEENT: Head is normocephalic and atraumatic. EOMI are intact. Mouth is well hydrated and without lesions. NECK: Supple. No masses LUNGS: Clear to auscultation. No presence of rhonchi/wheezing/rales. Adequate chest expansion HEART: RRR, normal s1 and s2. ABDOMEN: tender to palpation of the epigastric area, no guarding, no peritoneal signs, and nondistended. BS +. No masses. EXTREMITIES: Without any cyanosis, clubbing, rash, lesions or edema. NEUROLOGIC: AOx3, no focal motor deficit. SKIN: no jaundice, no rashes  Laboratory Data CBC:     Component Value Date/Time   WBC 9.5 04/02/2020 1440   RBC 4.63 04/02/2020 1440  HGB 15.4 (H) 04/02/2020 1440   HCT 45.4 04/02/2020 1440   PLT 294 04/02/2020 1440   MCV 98.1 04/02/2020 1440   MCH 33.3 04/02/2020 1440   MCHC 33.9 04/02/2020 1440   RDW 12.8 04/02/2020 1440   LYMPHSABS 2.0 04/02/2020 1440   MONOABS 1.0 04/02/2020 1440   EOSABS 0.2 04/02/2020 1440   BASOSABS 0.0 04/02/2020 1440   COAG: No results found for: INR, PROTIME  BMP:  BMP Latest Ref Rng & Units 04/03/2020 04/02/2020 04/02/2020  Glucose 70 - 99 mg/dL 102(H) 100(H) 130(H)  BUN 6 - 20 mg/dL 17 25(H) 24(H)  Creatinine 0.44 - 1.00 mg/dL 0.89 1.29(H) 1.34(H)  Sodium 135 - 145 mmol/L 135 133(L) 133(L)  Potassium 3.5 - 5.1 mmol/L 2.1(LL) 3.0(L) 2.2(LL)  Chloride 98 - 111 mmol/L 89(L) 83(L) 78(L)  CO2 22 - 32 mmol/L 36(H) 38(H) 39(H)  Calcium 8.9 - 10.3 mg/dL 8.7(L) 9.4 9.7    HEPATIC:  Hepatic Function Latest Ref Rng & Units 04/02/2020  Total Protein  6.5 - 8.1 g/dL 8.2(H)  Albumin 3.5 - 5.0 g/dL 4.0  AST 15 - 41 U/L 19  ALT 0 - 44 U/L 13  Alk Phosphatase 38 - 126 U/L 119  Total Bilirubin 0.3 - 1.2 mg/dL 0.2(L)    CARDIAC: No results found for: CKTOTAL, CKMB, CKMBINDEX, TROPONINI   Imaging: I personally reviewed and interpreted the available imaging.  Assessment & Plan: Carollee Nussbaumer is a 44 y.o. female with history of GERD, stroke and history of gastric ulcers complicated by perforation requiring ex lap in the past, who comes to the hospital after presenting with epigastric pain, nausea and vomiting.  The patient has presented new onset of symptoms which were similar to her previous presentation for gastric and duodenal ulcer.  She has evidence of inflammation in the distal antrum, for which I consider endoscopic evaluation with an EGD is warranted at this time.  She has no evidence of ongoing gastrointestinal bleeding although it is unclear if she has had coffee-ground emesis.  It is most likely that her alterations are secondary to chronic aspirin intake.  I advised the patient to avoid these medications and try other treatments for headache as these will need to recurrent ulcerations and possible perforations in the future.  She will need to continue on a PPI IV for now.  We will proceed with EGD tomorrow if her potassium and electrolytes have corrected adequately.  # Gastritis - ?gastric ulcer # Epigastric abdominal pain # Chronic NSAID use - Repeat CBC daily, transfuse if Hb <7 - Pantoprazole ggt - 2 large bore IV lines - Active T/S - Zofran as needed for nausea - Keep NPO after MN, can have regular food today - Avoid NSAIDs or high dose aspirin, patient counseled - Will proceed with EGD tomorrow if electrolytes have been corrected  Harvel Quale, MD Gastroenterology and Hepatology [facility]   Note: Occasional unusual wording and randomly placed punctuation marks may result  from the use of speech recognition technology to transcribe this document

## 2020-04-04 ENCOUNTER — Encounter (HOSPITAL_COMMUNITY): Payer: Self-pay | Admitting: Anesthesiology

## 2020-04-04 ENCOUNTER — Encounter (HOSPITAL_COMMUNITY)
Admission: EM | Discharge status: Against Medical Advice | Payer: Self-pay | Source: Home / Self Care | Attending: Family Medicine

## 2020-04-04 DIAGNOSIS — R112 Nausea with vomiting, unspecified: Secondary | ICD-10-CM

## 2020-04-04 DIAGNOSIS — E873 Alkalosis: Secondary | ICD-10-CM

## 2020-04-04 LAB — BASIC METABOLIC PANEL
Anion gap: 5 (ref 5–15)
BUN: 9 mg/dL (ref 6–20)
CO2: 28 mmol/L (ref 22–32)
Calcium: 7.8 mg/dL — ABNORMAL LOW (ref 8.9–10.3)
Chloride: 104 mmol/L (ref 98–111)
Creatinine, Ser: 0.62 mg/dL (ref 0.44–1.00)
GFR, Estimated: >60 mL/min (ref >60)
Glucose, Bld: 79 mg/dL (ref 70–99)
Potassium: 3.7 mmol/L (ref 3.5–5.1)
Sodium: 137 mmol/L (ref 135–145)

## 2020-04-04 LAB — CBC
HCT: 37.5 % (ref 36.0–46.0)
Hemoglobin: 11.8 g/dL — ABNORMAL LOW (ref 12.0–15.0)
MCH: 33.3 pg (ref 26.0–34.0)
MCHC: 31.5 g/dL (ref 30.0–36.0)
MCV: 105.9 fL — ABNORMAL HIGH (ref 80.0–100.0)
Platelets: 209 10*3/uL (ref 150–400)
RBC: 3.54 MIL/uL — ABNORMAL LOW (ref 3.87–5.11)
RDW: 13 % (ref 11.5–15.5)
WBC: 6.6 10*3/uL (ref 4.0–10.5)
nRBC: 0 % (ref 0.0–0.2)

## 2020-04-04 LAB — MAGNESIUM: Magnesium: 2.5 mg/dL — ABNORMAL HIGH (ref 1.7–2.4)

## 2020-04-04 SURGERY — ESOPHAGOGASTRODUODENOSCOPY (EGD) WITH PROPOFOL
Anesthesia: Monitor Anesthesia Care

## 2020-04-04 MED ORDER — METOCLOPRAMIDE HCL 5 MG/ML IJ SOLN
10.0000 mg | Freq: Once | INTRAMUSCULAR | Status: AC
  Administered 2020-04-04: 10 mg via INTRAVENOUS
  Filled 2020-04-04: qty 2

## 2020-04-04 NOTE — Discharge Summary (Signed)
Physician Discharge Summary  Kristina Cardenas SEG:[id number] DOB: [birth year] DOA: 04/02/2020  PCP: Kristina Cardenas, No Pcp Per  Admit date: 04/02/2020 Discharge date: 04/04/2020  Admitted From: Home Disposition:  Against Medical Advice   Discharge Condition: Against Medical Advice   Brief/Interim Summary: 44 y.o.femalewith a history of Stroke, PUD. Presents withgeneralized weakness, abdominal pain in the epigastric region that radiates into her back. No palliating or provoking factors. She has been vomiting for the past few days with the inability to take oral food or liquid. Symptoms are constant but worsening with food  The Kristina Cardenas has a history of prior peptic ulcer disease and reports that she has had a perforated ulcer several years ago requiring surgical management.  She reports that she has been taking Goody Powder and some other "headache" medicine but denies taking ibuprofen or aleve.  She reports she has epigastric abdominal pain. GI was consulted to assist. Kristina Cardenas was noted to have cocaine on UDS--EGD cancelled.    In speaking with Pricilla Larsson, Pricilla Larsson has demonstrated the ability to understand his medical condition(s) which include esophagitis, peptic ulcer disease.  Caoimhe Damron has demonstrated the ability to appreciate how treatment for esophagitis, peptic ulcer disease  will be beneficial.   Pricilla Larsson has also demonstrated the ability to understand and appreciate how refusal of treatement foresophagitis, peptic ulcer disease could result in harm, repeat hospitalization, and possibly death.  Josely Moffat demonstrates the ability to reason through the risks and benefits of the proposed treatment.  Finally, Kurstin Dimarzo is able to clearly communicate his/her choice.   Discharge Diagnoses:  Intractable nausea/vomiting/abd pain -12/3 CT abd--inflam thickening distal stomach in pylorus region concerning for ulcer; non-obstructive renal calculi -GI consult  appreciated--initially planned EGD -EGD cancelled when Kristina Cardenas noted to have cocaine on UDS -initially placed on protonix drip -avoid NSAIDS -judicious opioids -po protonix 40 mg bid sent to [facility] on [address] -case discussed with GI, Dr. Amil Amen  Hypokalemia -repleted -mag 2.5  Cocaine abuse -cessation discussed  Hyponatremia -due to volume depletion -started on IVF  Macrocytic anemia -Kristina Cardenas left AMA before workup could be done   Discharge Instructions   Allergies as of 04/04/2020      Reactions   Sulfa Antibiotics    Toradol [ketorolac Tromethamine]       Medication List    TAKE these medications   HYDROcodone-acetaminophen 10-325 MG tablet Commonly known as: NORCO Take 1 tablet by mouth every 8 (eight) hours as needed.   pantoprazole 40 MG tablet Commonly known as: PROTONIX Take 40 mg by mouth daily.   promethazine 25 MG tablet Commonly known as: PHENERGAN Take 25 mg by mouth every 8 (eight) hours as needed.       Allergies  Allergen Reactions  . Sulfa Antibiotics   . Toradol [Ketorolac Tromethamine]     Consultations:  GI   Procedures/Studies: CT ABDOMEN PELVIS WO CONTRAST  Result Date: 04/02/2020 CLINICAL DATA:  44 year old female with abdominal pain. EXAM: CT ABDOMEN AND PELVIS WITHOUT CONTRAST TECHNIQUE: Multidetector CT imaging of the abdomen and pelvis was performed following the standard protocol without IV contrast. COMPARISON:  None. FINDINGS: Evaluation of this exam is limited in the absence of intravenous contrast. Lower chest: The visualized lung bases are clear. No intra-abdominal free air or free fluid. Hepatobiliary: The liver is unremarkable. No intrahepatic biliary dilatation. Mild thickened appearance of the gallbladder wall. No calcified gallstone or pericholecystic fluid. Pancreas: Unremarkable. No pancreatic ductal dilatation or surrounding inflammatory changes. Spleen: Normal in size without  focal abnormality.  Adrenals/Urinary Tract: The adrenal glands unremarkable. Multiple nonobstructing bilateral renal calculi measure up to 5 mm in the inferior pole of the left kidney. There is no hydronephrosis on either side. The visualized ureters and urinary bladder appear unremarkable. Stomach/Bowel: There is inflammatory changes and thickening of the distal stomach in the region of the pylorus concerning for underlying gastric ulcer. There is apparent area of outpouching from the posterior aspect of the distal stomach (28/2 and 70/6) likely represents an ulcer. Clinical correlation and follow-up with endoscopy after resolution of active inflammation recommended to exclude underlying malignancy. Several mildly rounded adjacent lymph nodes, likely reactive. There is thickening of the ascending and proximal transverse colon, likely related to underdistention. Moderate stool noted in the distal colon. There is no bowel obstruction. The appendix is normal. Vascular/Lymphatic: Moderate aortoiliac atherosclerotic disease. The IVC is unremarkable. No portal venous gas. There is no adenopathy. Reproductive: The uterus and ovaries are grossly unremarkable. No pelvic mass. Other: None Musculoskeletal: Degenerative changes of the spine. No acute osseous pathology. Bilateral L5 pars defects with grade 1 L5-S1 anterolisthesis. IMPRESSION: 1. Inflammatory changes and thickening of the distal stomach in the region of the pylorus concerning for underlying gastric ulcer. Clinical correlation and follow-up with endoscopy after resolution of active inflammation recommended to exclude underlying malignancy. 2. Nonobstructing bilateral renal calculi. No hydronephrosis. 3. No bowel obstruction. Normal appendix. 4. Aortic Atherosclerosis (ICD10-I70.0). Electronically Signed   By: Elgie CollardArash  Radparvar M.D.   On: 04/02/2020 23:41   DG Chest Port 1 View  Result Date: 04/02/2020 CLINICAL DATA:  Cough, abdominal pain, emesis EXAM: PORTABLE CHEST 1 VIEW  COMPARISON:  None. FINDINGS: Single frontal view of the chest demonstrates an unremarkable cardiac silhouette. No airspace disease, effusion, or pneumothorax. Prior healed right rib fractures. Rounded metallic densities projecting over the left upper quadrant abdomen are nonspecific, and may lie on or beneath the Kristina Cardenas. IMPRESSION: 1. No acute intrathoracic process. Electronically Signed   By: Sharlet SalinaMichael  Brown M.D.   On: 04/02/2020 15:23        Discharge Exam: Vitals:   04/03/20 1958 04/04/20 0549  BP: (!) 93/50 (!) 97/59  Pulse: 86 66  Resp: 18 17  Temp: 98.9 F (37.2 C) 97.6 F (36.4 C)  SpO2: 97% 99%   Vitals:   04/03/20 1300 04/03/20 1900 04/03/20 1958 04/04/20 0549  BP: 105/64  (!) 93/50 (!) 97/59  Pulse: 71  86 66  Resp: 18  18 17   Temp: 98.8 F (37.1 C)  98.9 F (37.2 C) 97.6 F (36.4 C)  TempSrc: Oral     SpO2: 97% 98% 97% 99%  Weight:      Height:        General: Pt is alert, awake, not in acute distress Cardiovascular: RRR, S1/S2 +, no rubs, no gallops Respiratory: CTA bilaterally, no wheezing, no rhonchi Abdominal: Soft, NT, ND, bowel sounds + Extremities: no edema, no cyanosis   The results of significant diagnostics from this hospitalization (including imaging, microbiology, ancillary and laboratory) are listed below for reference.    Significant Diagnostic Studies: CT ABDOMEN PELVIS WO CONTRAST  Result Date: 04/02/2020 CLINICAL DATA:  44 year old female with abdominal pain. EXAM: CT ABDOMEN AND PELVIS WITHOUT CONTRAST TECHNIQUE: Multidetector CT imaging of the abdomen and pelvis was performed following the standard protocol without IV contrast. COMPARISON:  None. FINDINGS: Evaluation of this exam is limited in the absence of intravenous contrast. Lower chest: The visualized lung bases are clear. No intra-abdominal free air or free  fluid. Hepatobiliary: The liver is unremarkable. No intrahepatic biliary dilatation. Mild thickened appearance of the gallbladder  wall. No calcified gallstone or pericholecystic fluid. Pancreas: Unremarkable. No pancreatic ductal dilatation or surrounding inflammatory changes. Spleen: Normal in size without focal abnormality. Adrenals/Urinary Tract: The adrenal glands unremarkable. Multiple nonobstructing bilateral renal calculi measure up to 5 mm in the inferior pole of the left kidney. There is no hydronephrosis on either side. The visualized ureters and urinary bladder appear unremarkable. Stomach/Bowel: There is inflammatory changes and thickening of the distal stomach in the region of the pylorus concerning for underlying gastric ulcer. There is apparent area of outpouching from the posterior aspect of the distal stomach (28/2 and 70/6) likely represents an ulcer. Clinical correlation and follow-up with endoscopy after resolution of active inflammation recommended to exclude underlying malignancy. Several mildly rounded adjacent lymph nodes, likely reactive. There is thickening of the ascending and proximal transverse colon, likely related to underdistention. Moderate stool noted in the distal colon. There is no bowel obstruction. The appendix is normal. Vascular/Lymphatic: Moderate aortoiliac atherosclerotic disease. The IVC is unremarkable. No portal venous gas. There is no adenopathy. Reproductive: The uterus and ovaries are grossly unremarkable. No pelvic mass. Other: None Musculoskeletal: Degenerative changes of the spine. No acute osseous pathology. Bilateral L5 pars defects with grade 1 L5-S1 anterolisthesis. IMPRESSION: 1. Inflammatory changes and thickening of the distal stomach in the region of the pylorus concerning for underlying gastric ulcer. Clinical correlation and follow-up with endoscopy after resolution of active inflammation recommended to exclude underlying malignancy. 2. Nonobstructing bilateral renal calculi. No hydronephrosis. 3. No bowel obstruction. Normal appendix. 4. Aortic Atherosclerosis (ICD10-I70.0).  Electronically Signed   By: Elgie Collard M.D.   On: 04/02/2020 23:41   DG Chest Port 1 View  Result Date: 04/02/2020 CLINICAL DATA:  Cough, abdominal pain, emesis EXAM: PORTABLE CHEST 1 VIEW COMPARISON:  None. FINDINGS: Single frontal view of the chest demonstrates an unremarkable cardiac silhouette. No airspace disease, effusion, or pneumothorax. Prior healed right rib fractures. Rounded metallic densities projecting over the left upper quadrant abdomen are nonspecific, and may lie on or beneath the Kristina Cardenas. IMPRESSION: 1. No acute intrathoracic process. Electronically Signed   By: Sharlet Salina M.D.   On: 04/02/2020 15:23     Microbiology: Recent Results (from the past 240 hour(s))  Resp Panel by RT-PCR (Flu A&B, Covid) Nasopharyngeal Swab     Status: None   Collection Time: 04/02/20  2:40 PM   Specimen: Nasopharyngeal Swab; Nasopharyngeal(NP) swabs in vial transport medium  Result Value Ref Range Status   SARS Coronavirus 2 by RT PCR NEGATIVE NEGATIVE Final   Influenza A by PCR NEGATIVE NEGATIVE Final   Influenza B by PCR NEGATIVE NEGATIVE Final    Comment: Performed at [facility], [address] [place], [place] [address]     Labs: Basic Metabolic Panel: Recent Labs  Lab 04/02/20 1440 04/02/20 1440 04/02/20 1853 04/02/20 1853 04/03/20 0551 04/03/20 0551 04/03/20 1338 04/04/20 0625  NA 133*  --  133*  --  135  --  134* 137  K 2.2*   < > 3.0*   < > 2.1*   < > 2.8* 3.7  CL 78*  --  83*  --  89*  --  97* 104  CO2 39*  --  38*  --  36*  --  29 28  GLUCOSE 130*  --  100*  --  102*  --  117* 79  BUN 24*  --  25*  --  17  --  13 9  CREATININE 1.34*  --  1.29*  --  0.89  --  0.75 0.62  CALCIUM 9.7  --  9.4  --  8.7*  --  7.7* 7.8*  MG  --   --  1.7  --  1.7  --   --  2.5*   < > = values in this interval not displayed.   Liver Function Tests: Recent Labs  Lab 04/02/20 1440  AST 19  ALT 13  ALKPHOS 119  BILITOT 0.2*  PROT 8.2*  ALBUMIN 4.0   No results for  input(s): LIPASE, AMYLASE in the last 168 hours. No results for input(s): AMMONIA in the last 168 hours. CBC: Recent Labs  Lab 04/02/20 1440 04/04/20 0625  WBC 9.5 6.6  NEUTROABS 6.2  --   HGB 15.4* 11.8*  HCT 45.4 37.5  MCV 98.1 105.9*  PLT 294 209   Cardiac Enzymes: No results for input(s): CKTOTAL, CKMB, CKMBINDEX, TROPONINI in the last 168 hours. BNP: Invalid input(s): POCBNP CBG: No results for input(s): GLUCAP in the last 168 hours.  Time coordinating discharge:  36 minutes  Signed:  Catarina Hartshorn, DO [facility] Pager: [phone] 04/04/2020, 6:04 PM

## 2020-04-04 NOTE — Progress Notes (Signed)
Kristina Cardenas, M.D. Gastroenterology & Hepatology   Interval History:  No acute events overnight.  Urine testing came back positive for cocaine. Discussed the case with anesthesia who considered sedation with propofol will only be indicated if it is an emergent lifesaving situation.  I talked with patient about this and the patient was very upset.  She stated that her test was positive as "she lived with someone and smoke cocaine and that is what how her test became positive".  Patient states that she is still having epigastric abdominal pain and some nausea but has not vomited.  No melena, hematochezia, hematemesis.  Labs today show decreasing hemoglobin of 11.8, other cell lines were noted as well to be lower than prior.  However, her BUN is 9, lower than previous.  Potassium today is 3.7. Patient is requesting to leave AMA to be evaluated at another facility.  Inpatient Medications:  Current Facility-Administered Medications:  .  0.9 % NaCl with KCl 20 mEq/ L  infusion, , Intravenous, Continuous, Johnson, Clanford L, MD, Last Rate: 100 mL/hr at 04/03/20 1810, New Bag at 04/03/20 1810 .  alum & mag hydroxide-simeth (MAALOX/MYLANTA) 200-200-20 MG/5ML suspension 30 mL, 30 mL, Oral, Q6H PRN, Truett Mainland, DO, 30 mL at 04/03/20 1808 .  enoxaparin (LOVENOX) injection 40 mg, 40 mg, Subcutaneous, Q24H, Stinson, Jacob J, DO .  morphine 2 MG/ML injection 1 mg, 1 mg, Intravenous, Q4H PRN, Johnson, Clanford L, MD .  ondansetron (ZOFRAN) tablet 4 mg, 4 mg, Oral, Q6H PRN **OR** ondansetron (ZOFRAN) injection 4 mg, 4 mg, Intravenous, Q6H PRN, Truett Mainland, DO, 4 mg at 04/04/20 0532 .  oxyCODONE-acetaminophen (PERCOCET/ROXICET) 5-325 MG per tablet 1 tablet, 1 tablet, Oral, Q4H PRN, Wynetta Emery, Clanford L, MD, 1 tablet at 04/04/20 1001 .  pantoprazole (PROTONIX) injection 40 mg, 40 mg, Intravenous, Q12H, Truett Mainland, DO, 40 mg at 04/04/20 1001   I/O    Intake/Output Summary (Last 24 hours) at  04/04/2020 1018 Last data filed at 04/04/2020 0500 Gross per 24 hour  Intake 1055.9 ml  Output 300 ml  Net 755.9 ml     Physical Exam: Temp:  [97.6 F (36.4 C)-98.9 F (37.2 C)] 97.6 F (36.4 C) (12/05 0549) Pulse Rate:  [66-86] 66 (12/05 0549) Resp:  [17-18] 17 (12/05 0549) BP: (93-105)/(50-64) 97/59 (12/05 0549) SpO2:  [97 %-99 %] 99 % (12/05 0549)  Temp (24hrs), Avg:98.4 F (36.9 C), Min:97.6 F (36.4 C), Max:98.9 F (37.2 C) GENERAL: The patient is AO x3, in no acute distress. HEENT: Head is normocephalic and atraumatic. EOMI are intact. Mouth is well hydrated and without lesions. NECK: Supple. No masses LUNGS: Clear to auscultation. No presence of rhonchi/wheezing/rales. Adequate chest expansion HEART: RRR, normal s1 and s2. ABDOMEN: tender to palpation in epigastric area, no guarding, no peritoneal signs, and nondistended. BS +. No masses. EXTREMITIES: Without any cyanosis, clubbing, rash, lesions or edema. NEUROLOGIC: AOx3, no focal motor deficit. SKIN: no jaundice, no rashes  Laboratory Data: CBC:     Component Value Date/Time   WBC 6.6 04/04/2020 0625   RBC 3.54 (L) 04/04/2020 0625   HGB 11.8 (L) 04/04/2020 0625   HCT 37.5 04/04/2020 0625   PLT 209 04/04/2020 0625   MCV 105.9 (H) 04/04/2020 0625   MCH 33.3 04/04/2020 0625   MCHC 31.5 04/04/2020 0625   RDW 13.0 04/04/2020 0625   LYMPHSABS 2.0 04/02/2020 1440   MONOABS 1.0 04/02/2020 1440   EOSABS 0.2 04/02/2020 1440   BASOSABS 0.0 04/02/2020  1440   COAG: No results found for: INR, PROTIME  BMP:  BMP Latest Ref Rng & Units 04/04/2020 04/03/2020 04/03/2020  Glucose 70 - 99 mg/dL 79 117(H) 102(H)  BUN 6 - 20 mg/dL _0 Creatinine 0.44 - 1.00 mg/dL 0.62 0.75 0.89  Sodium 135 - 145 mmol/L 137 134(L) 135  Potassium 3.5 - 5.1 mmol/L 3.7 2.8(L) 2.1(LL)  Chloride 98 - 111 mmol/L 104 97(L) 89(L)  CO2 22 - 32 mmol/L 28 29 36(H)  Calcium 8.9 - 10.3 mg/dL 7.8(L) 7.7(L) 8.7(L)    HEPATIC:  Hepatic Function  Latest Ref Rng & Units 04/02/2020  Total Protein 6.5 - 8.1 g/dL 8.2(H)  Albumin 3.5 - 5.0 g/dL 4.0  AST 15 - 41 U/L 19  ALT 0 - 44 U/L 13  Alk Phosphatase 38 - 126 U/L 119  Total Bilirubin 0.3 - 1.2 mg/dL 0.2(L)    CARDIAC: No results found for: CKTOTAL, CKMB, CKMBINDEX, TROPONINI    Imaging: I personally reviewed and interpreted the available labs, imaging and endoscopic files.   Assessment/Plan: Kristina Cardenas is a 44 y.o. female with history of GERD, stroke and history of gastric ulcers complicated by perforation requiring ex lap in the past, who comes to the hospital after presenting with epigastric pain, nausea and vomiting.  The patient has presented new onset of symptoms which were similar to her previous presentation for gastric and duodenal ulcer.  She has evidence of inflammation in the distal antrum, for which I considered endoscopic evaluation could be useful.  However the patient is positive for cocaine in her urine which precludes her sedation unless is really necessary based on discussion with anesthesia.  I spoke about this to the patient who understood but was upset about it.  I explained to her that the reason for her gastritis and possible ulcer was related to a combination of use of cocaine, NSAIDs and heavy smoking, which I strongly advised to quit to avoid further complications.  I also informed her that even though she had some decrease in her hemoglobin she had a normal BUN which goes against an active bleeding, but also she did not have any episodes of melena which makes it less likely an active gastrointestinal bleed at this point.  The patient was requesting to leave AMA and to receive some pantoprazole.  The patient did not want to set up a follow-up appointment as she reports "did not have any type of insurance and would not follow-up".  # Gastritis - ?gastric ulcer # Epigastric abdominal pain # Chronic NSAID use - Pantoprazole 40 mg BID PO for at least 3 months -  Zofran as needed for nausea - Cocaine cessation, will consider outpatient EGD if patient has negative testing - Avoid NSAIDs or high dose aspirin, patient counseled - GI service will sign-off, please call us back if you have any more questions.  Kristina Peppers, MD Gastroenterology and Hepatology [facility]  Note: Occasional unusual wording and randomly placed punctuation marks may result from the use of speech recognition technology to transcribe this document

## 2020-04-04 NOTE — Progress Notes (Signed)
Approximately  1250 patient stated to nurse that she was going down stairs and would be right back. This nurse stated to patient that she would have to leave AMA and return through ED to be readmitted this nurse asked patient if she would return to room and this nurse would speak to her patient agreed and walked back to room. This nurse spoke with patient about her concerns t This nurse paged Dr.Tat and Dr. Levon Hedger  regarding patient wanting to leave AMA. Both Dr. Arbutus Leas and Dr.Castaneda spoke to patient. Patient still insisted on leaving AMA. Patient signed AMA paper and IV was removed. Patient walked to elevator.

## 2020-04-04 NOTE — Progress Notes (Addendum)
Reviewed labs for patient this morning.  Cocaine positive in urine as of yesterday.  Outside of the setting of an emergent life-saving procedure, anesthesia is contraindicated.  I recommend abstinence for 14 days from cocaine, and rescheduling of procedure following or concurrent with a negative urine drug test.

## 2020-08-16 ENCOUNTER — Emergency Department (HOSPITAL_COMMUNITY)
Admission: EM | Admit: 2020-08-16 | Discharge: 2020-08-16 | Disposition: A | Discharge status: Home / Self Care | Payer: Medicaid Other | Attending: Emergency Medicine | Admitting: Emergency Medicine

## 2020-08-16 ENCOUNTER — Emergency Department (HOSPITAL_COMMUNITY): Payer: Medicaid Other

## 2020-08-16 ENCOUNTER — Other Ambulatory Visit: Payer: Self-pay

## 2020-08-16 ENCOUNTER — Encounter (HOSPITAL_COMMUNITY): Payer: Self-pay | Admitting: *Deleted

## 2020-08-16 DIAGNOSIS — K253 Acute gastric ulcer without hemorrhage or perforation: Secondary | ICD-10-CM

## 2020-08-16 DIAGNOSIS — F17210 Nicotine dependence, cigarettes, uncomplicated: Secondary | ICD-10-CM | POA: Insufficient documentation

## 2020-08-16 DIAGNOSIS — T8189XA Other complications of procedures, not elsewhere classified, initial encounter: Secondary | ICD-10-CM | POA: Diagnosis not present

## 2020-08-16 DIAGNOSIS — K219 Gastro-esophageal reflux disease without esophagitis: Secondary | ICD-10-CM | POA: Insufficient documentation

## 2020-08-16 DIAGNOSIS — R1013 Epigastric pain: Secondary | ICD-10-CM | POA: Diagnosis present

## 2020-08-16 DIAGNOSIS — Z8616 Personal history of COVID-19: Secondary | ICD-10-CM | POA: Insufficient documentation

## 2020-08-16 LAB — COMPREHENSIVE METABOLIC PANEL
ALT: 13 U/L (ref 0–44)
AST: 21 U/L (ref 15–41)
Albumin: 3.7 g/dL (ref 3.5–5.0)
Alkaline Phosphatase: 147 U/L — ABNORMAL HIGH (ref 38–126)
Anion gap: 11 (ref 5–15)
BUN: 17 mg/dL (ref 6–20)
CO2: 25 mmol/L (ref 22–32)
Calcium: 9.3 mg/dL (ref 8.9–10.3)
Chloride: 103 mmol/L (ref 98–111)
Creatinine, Ser: 0.64 mg/dL (ref 0.44–1.00)
GFR, Estimated: >60 mL/min (ref >60)
Glucose, Bld: 89 mg/dL (ref 70–99)
Potassium: 3.4 mmol/L — ABNORMAL LOW (ref 3.5–5.1)
Sodium: 139 mmol/L (ref 135–145)
Total Bilirubin: 0.5 mg/dL (ref 0.3–1.2)
Total Protein: 7.6 g/dL (ref 6.5–8.1)

## 2020-08-16 LAB — CBC WITH DIFFERENTIAL/PLATELET
Abs Immature Granulocytes: 0.02 10*3/uL (ref 0.00–0.07)
Basophils Absolute: 0 10*3/uL (ref 0.0–0.1)
Basophils Relative: 0 %
Eosinophils Absolute: 0.1 10*3/uL (ref 0.0–0.5)
Eosinophils Relative: 1 %
HCT: 43.8 % (ref 36.0–46.0)
Hemoglobin: 14.4 g/dL (ref 12.0–15.0)
Immature Granulocytes: 0 %
Lymphocytes Relative: 23 %
Lymphs Abs: 1.9 10*3/uL (ref 0.7–4.0)
MCH: 33.4 pg (ref 26.0–34.0)
MCHC: 32.9 g/dL (ref 30.0–36.0)
MCV: 101.6 fL — ABNORMAL HIGH (ref 80.0–100.0)
Monocytes Absolute: 0.5 10*3/uL (ref 0.1–1.0)
Monocytes Relative: 6 %
Neutro Abs: 5.8 10*3/uL (ref 1.7–7.7)
Neutrophils Relative %: 70 %
Platelets: 315 10*3/uL (ref 150–400)
RBC: 4.31 MIL/uL (ref 3.87–5.11)
RDW: 13.4 % (ref 11.5–15.5)
WBC: 8.3 10*3/uL (ref 4.0–10.5)
nRBC: 0 % (ref 0.0–0.2)

## 2020-08-16 LAB — URINALYSIS, ROUTINE W REFLEX MICROSCOPIC
Bilirubin Urine: NEGATIVE
Glucose, UA: NEGATIVE mg/dL
Ketones, ur: 20 mg/dL — AB
Leukocytes,Ua: NEGATIVE
Nitrite: NEGATIVE
Protein, ur: NEGATIVE mg/dL
Specific Gravity, Urine: 1.021 (ref 1.005–1.030)
pH: 7 (ref 5.0–8.0)

## 2020-08-16 LAB — LIPASE, BLOOD: Lipase: 23 U/L (ref 11–51)

## 2020-08-16 MED ORDER — LIDOCAINE VISCOUS HCL 2 % MT SOLN
15.0000 mL | Freq: Once | OROMUCOSAL | Status: AC
  Administered 2020-08-16: 15 mL via ORAL
  Filled 2020-08-16: qty 15

## 2020-08-16 MED ORDER — HYDROMORPHONE HCL 1 MG/ML IJ SOLN
0.5000 mg | Freq: Once | INTRAMUSCULAR | Status: AC
  Administered 2020-08-16: 0.5 mg via INTRAVENOUS
  Filled 2020-08-16: qty 1

## 2020-08-16 MED ORDER — ONDANSETRON HCL 4 MG/2ML IJ SOLN
4.0000 mg | Freq: Once | INTRAMUSCULAR | Status: AC
  Administered 2020-08-16: 4 mg via INTRAVENOUS
  Filled 2020-08-16: qty 2

## 2020-08-16 MED ORDER — PANTOPRAZOLE SODIUM 40 MG IV SOLR
40.0000 mg | Freq: Once | INTRAVENOUS | Status: AC
  Administered 2020-08-16: 40 mg via INTRAVENOUS
  Filled 2020-08-16: qty 40

## 2020-08-16 MED ORDER — SUCRALFATE 1 G PO TABS: 1.0000 g | ORAL_TABLET | Freq: Three times a day (TID) | ORAL | 0 refills | Status: DC

## 2020-08-16 MED ORDER — PANTOPRAZOLE SODIUM 40 MG PO TBEC: 40.0000 mg | DELAYED_RELEASE_TABLET | Freq: Every day | ORAL | 1 refills | Status: DC

## 2020-08-16 MED ORDER — ALUM & MAG HYDROXIDE-SIMETH 200-200-20 MG/5ML PO SUSP
30.0000 mL | Freq: Once | ORAL | Status: AC
  Administered 2020-08-16: 30 mL via ORAL
  Filled 2020-08-16: qty 30

## 2020-08-16 MED ORDER — PROMETHAZINE HCL 25 MG PO TABS: 25.0000 mg | ORAL_TABLET | Freq: Four times a day (QID) | ORAL | 0 refills | Status: DC | PRN

## 2020-08-16 MED ORDER — IOHEXOL 300 MG/ML  SOLN
100.0000 mL | Freq: Once | INTRAMUSCULAR | Status: AC | PRN
  Administered 2020-08-16: 100 mL via INTRAVENOUS

## 2020-08-16 MED ORDER — OXYCODONE-ACETAMINOPHEN 5-325 MG PO TABS: 1.0000 | ORAL_TABLET | Freq: Four times a day (QID) | ORAL | 0 refills | Status: DC | PRN

## 2020-08-16 MED ORDER — SODIUM CHLORIDE 0.9 % IV BOLUS
1000.0000 mL | Freq: Once | INTRAVENOUS | Status: AC
  Administered 2020-08-16: 1000 mL via INTRAVENOUS

## 2020-08-16 NOTE — ED Notes (Signed)
Pt states she is still unable to provide urine specimen at this time

## 2020-08-16 NOTE — ED Notes (Signed)
POC preg negative.

## 2020-08-16 NOTE — ED Notes (Signed)
Pt states, "I have ulcers and they are making me sick as a dog." Pt states she has been vomiting for 3 days, denies diarrhea.

## 2020-08-16 NOTE — ED Provider Notes (Signed)
[facility] EMERGENCY DEPARTMENT Provider Note   CSN: [id number] Arrival date & time: 08/16/20  1239     History Chief Complaint  Patient presents with  . Abdominal Pain    Kristina Cardenas is a 45 y.o. female who presents to the emergency department with a chief complaint of epigastric abdominal pain.  She has a past medical history of gastritis and ulcers and history of perforated gastric ulcer x2 with open laparotomy.  Patient presents with 3 days of vomiting and epigastric pain.  She states this feels the same as her previous episodes of "my ulcer acting up."  She states that Protonix generally helps her.  She denies hematemesis, melena or hematochezia.  HPI     Past Medical History:  Diagnosis Date  . Bell's palsy   . COVID   . GERD (gastroesophageal reflux disease)   . History of stomach ulcers   . Stroke [facility])     Patient Active Problem List   Diagnosis Date Noted  . Nausea and vomiting   . Gastric ulcer 04/03/2020  . Gastric ulceration 04/03/2020  . Acute hypokalemia 04/02/2020  . Metabolic alkalosis 04/02/2020  . Bell's palsy   . Stroke (HCC)   . History of Upper GI bleed 10/01/2015  . Gastroesophageal reflux disease 01/03/2002    Past Surgical History:  Procedure Laterality Date  . ABDOMINAL SURGERY       OB History   No obstetric history on file.     No family history on file.  Social History   Tobacco Use  . Smoking status: Current Every Day Smoker    Packs/day: 1.00    Types: Cigarettes  . Smokeless tobacco: Never Used  Substance Use Topics  . Alcohol use: Not Currently  . Drug use: Not Currently    Home Medications Prior to Admission medications   Medication Sig Start Date End Date Taking? Authorizing Provider  HYDROcodone-acetaminophen (NORCO) 10-325 MG tablet Take 1 tablet by mouth every 8 (eight) hours as needed. 03/08/20   [provider]  pantoprazole (PROTONIX) 40 MG tablet Take 40 mg by mouth daily. 12/30/19   [provider]  promethazine (PHENERGAN) 25 MG tablet Take 25 mg by mouth every 8 (eight) hours as needed.  03/08/20   [provider]    Allergies    Sulfa antibiotics and Toradol [ketorolac tromethamine]  Review of Systems   Review of Systems   Ten systems reviewed and are negative for acute change, except as noted in the HPI.   Physical Exam Updated Vital Signs BP 134/85   Pulse 77   Temp 98.4 F (36.9 C) (Oral)   Resp 20   SpO2 98%   Physical Exam Vitals and nursing note reviewed.  Constitutional:      General: She is not in acute distress.    Appearance: She is well-developed. She is not diaphoretic.  HENT:     Head: Normocephalic and atraumatic.  Eyes:     General: No scleral icterus.    Conjunctiva/sclera: Conjunctivae normal.  Cardiovascular:     Rate and Rhythm: Normal rate and regular rhythm.     Heart sounds: Normal heart sounds. No murmur heard. No friction rub. No gallop.   Pulmonary:     Effort: Pulmonary effort is normal. No respiratory distress.     Breath sounds: Normal breath sounds.  Abdominal:     General: A surgical scar is present. Bowel sounds are normal. There is no distension.     Palpations:  Abdomen is soft. There is no mass.     Tenderness: There is abdominal tenderness in the epigastric area and left upper quadrant. There is no guarding.     Hernia: No hernia is present.  Musculoskeletal:     Cervical back: Normal range of motion.  Skin:    General: Skin is warm and dry.  Neurological:     Mental Status: She is alert and oriented to person, place, and time.  Psychiatric:        Behavior: Behavior normal.     ED Results / Procedures / Treatments   Labs (all labs ordered are listed, but only abnormal results are displayed) Labs Reviewed  CBC WITH DIFFERENTIAL/PLATELET  COMPREHENSIVE METABOLIC PANEL  LIPASE, BLOOD  URINALYSIS, ROUTINE W REFLEX MICROSCOPIC  I-STAT BETA HCG BLOOD, ED ([facility], [facility], [facility] ONLY)     EKG None  Radiology No results found.  Procedures Procedures   Medications Ordered in ED Medications - No data to display  ED Course  I have reviewed the triage vital signs and the nursing notes.  Pertinent labs & imaging results that were available during my care of the patient were reviewed by me and considered in my medical decision making (see chart for details).    MDM Rules/Calculators/A&P                          CC:a[igastric pain VS:  Vitals:   08/16/20 1700 08/16/20 1800 08/16/20 1833 08/16/20 2030  BP: (!) 166/109 (!) 144/70 (!) 153/95 (!) 142/85  Pulse: 63 62 63 (!) 57  Resp:      Temp:    98.6 F (37 C)  TempSrc:      SpO2: (!) 89% 100% 100% 100%    MW:NUUVOZD is gathered by patient  and emr. Previous records obtained and reviewed. DDX:The patient's complaint of epigastric abd pain involves an extensive number of diagnostic and treatment options, and is a complaint that carries with it a high risk of complications, morbidity, and potential mortality. Given the large differential diagnosis, medical decision making is of high complexity. Differential diagnosis of epigastric pain includes: Functional or nonulcer dyspepsia PUD, GERD, Gastritis, (NSAIDs, alcohol, stress, H. pylori, pernicious anemia), pancreatitis or pancreatic cancer, overeating indigestion (high-fat foods, coffee), drugs (aspirin, antibiotics (eg, macrolides, metronidazole), corticosteroids, digoxin, narcotics, theophylline), gastroparesis, lactose intolerance, malabsorption gastric cancer, parasitic infection, (Giardia, Strongyloides, Ascaris) cholelithiasis, choledocholithiasis, or cholangitis, ACS, pericarditis, pneumonia, abdominal hernia, pregnancy, intestinal ischemia, esophageal rupture, gastric volvulus, hepatitis.  Labs: I ordered reviewed and interpreted labs which included CBC and CMP without significant abnormality, urine appears contaminated, lipase within normal limits Imaging: I  ordered and reviewed images which included CT abdomen and pelvis. I independently visualized and interpreted all imaging. Significant findings include inflamed gastric ulcer without perforation.  EKG: Consults: MDM: Patient here with severe abdominal pain.  Pain is only moderately controlled after several rounds of pain medication, 2 GI cocktails and IV Protonix.  I offered the patient admission however she declines to be admitted at this time and asks for medication to go home with because she wants to go to work.  She states she will return if things worsen.  She does not have any obvious signs of perforation at this time. PDMP reviewed during this encounter. Will be discharged with pain medication, Carafate, Protonix, promethazine and close outpatient follow-up.  Given referral to GI.  PUD diet modifications discussed. Patient disposition:The patient appears reasonably screened and/or stabilized for  discharge and I doubt any other medical condition or other [facility] requiring further screening, evaluation, or treatment in the ED at this time prior to discharge. I have discussed lab and/or imaging findings with the patient and answered all questions/concerns to the best of my ability.I have discussed return precautions and OP follow up.    Final Clinical Impression(s) / ED Diagnoses Final diagnoses:  None    Rx / DC Orders ED Discharge Orders    None       Arthor Captain, PA-C 08/16/20 2052    Terrilee Files, MD 08/17/20 1041

## 2020-08-16 NOTE — ED Triage Notes (Signed)
nausea and vomiting with abdominal pain x 3 days

## 2020-08-16 NOTE — ED Notes (Signed)
Oxycodone/acetaminophen 5-325 mg pre pack given to patient and pt's signature obtained on prescription.

## 2020-08-16 NOTE — Discharge Instructions (Addendum)
Contact a health care provider if:  Your symptoms do not improve within 7 days of starting treatment.  You have ongoing indigestion or heartburn.  Get help right away if:  You have sudden, sharp, or persistent pain in your abdomen.  You have bloody or dark black, tarry stools.  You vomit blood or material that looks like coffee grounds.  You become light-headed or you feel faint.  You become weak.  You become sweaty or clammy.

## 2020-08-17 MED FILL — Oxycodone w/ Acetaminophen Tab 5-325 MG: ORAL | Qty: 6 | Status: AC

## 2020-10-28 ENCOUNTER — Emergency Department (HOSPITAL_COMMUNITY): Payer: Medicaid Other

## 2020-10-28 ENCOUNTER — Other Ambulatory Visit: Payer: Self-pay

## 2020-10-28 ENCOUNTER — Emergency Department (HOSPITAL_COMMUNITY)
Admission: EM | Admit: 2020-10-28 | Discharge: 2020-10-29 | Disposition: A | Discharge status: Home / Self Care | Payer: Medicaid Other | Attending: Emergency Medicine | Admitting: Emergency Medicine

## 2020-10-28 ENCOUNTER — Encounter (HOSPITAL_COMMUNITY): Payer: Self-pay

## 2020-10-28 DIAGNOSIS — F17210 Nicotine dependence, cigarettes, uncomplicated: Secondary | ICD-10-CM | POA: Insufficient documentation

## 2020-10-28 DIAGNOSIS — T402X1A Poisoning by other opioids, accidental (unintentional), initial encounter: Secondary | ICD-10-CM | POA: Diagnosis not present

## 2020-10-28 DIAGNOSIS — Y900 Blood alcohol level of less than 20 mg/100 ml: Secondary | ICD-10-CM | POA: Insufficient documentation

## 2020-10-28 DIAGNOSIS — U071 COVID-19: Secondary | ICD-10-CM | POA: Diagnosis not present

## 2020-10-28 DIAGNOSIS — T40604A Poisoning by unspecified narcotics, undetermined, initial encounter: Secondary | ICD-10-CM

## 2020-10-28 LAB — COMPREHENSIVE METABOLIC PANEL
ALT: 18 U/L (ref 0–44)
AST: 31 U/L (ref 15–41)
Albumin: 3.5 g/dL (ref 3.5–5.0)
Alkaline Phosphatase: 186 U/L — ABNORMAL HIGH (ref 38–126)
Anion gap: 9 (ref 5–15)
BUN: 16 mg/dL (ref 6–20)
CO2: 23 mmol/L (ref 22–32)
Calcium: 8.4 mg/dL — ABNORMAL LOW (ref 8.9–10.3)
Chloride: 106 mmol/L (ref 98–111)
Creatinine, Ser: 0.87 mg/dL (ref 0.44–1.00)
GFR, Estimated: >60 mL/min (ref >60)
Glucose, Bld: 295 mg/dL — ABNORMAL HIGH (ref 70–99)
Potassium: 3.6 mmol/L (ref 3.5–5.1)
Sodium: 138 mmol/L (ref 135–145)
Total Bilirubin: 0.1 mg/dL — ABNORMAL LOW (ref 0.3–1.2)
Total Protein: 7.5 g/dL (ref 6.5–8.1)

## 2020-10-28 LAB — CBC WITH DIFFERENTIAL/PLATELET
Abs Immature Granulocytes: 0.04 10*3/uL (ref 0.00–0.07)
Basophils Absolute: 0 10*3/uL (ref 0.0–0.1)
Basophils Relative: 0 %
Eosinophils Absolute: 0.3 10*3/uL (ref 0.0–0.5)
Eosinophils Relative: 3 %
HCT: 39.8 % (ref 36.0–46.0)
Hemoglobin: 12.5 g/dL (ref 12.0–15.0)
Immature Granulocytes: 1 %
Lymphocytes Relative: 21 %
Lymphs Abs: 1.8 10*3/uL (ref 0.7–4.0)
MCH: 31.5 pg (ref 26.0–34.0)
MCHC: 31.4 g/dL (ref 30.0–36.0)
MCV: 100.3 fL — ABNORMAL HIGH (ref 80.0–100.0)
Monocytes Absolute: 0.3 10*3/uL (ref 0.1–1.0)
Monocytes Relative: 4 %
Neutro Abs: 6.1 10*3/uL (ref 1.7–7.7)
Neutrophils Relative %: 71 %
Platelets: 323 10*3/uL (ref 150–400)
RBC: 3.97 MIL/uL (ref 3.87–5.11)
RDW: 14.5 % (ref 11.5–15.5)
WBC: 8.5 10*3/uL (ref 4.0–10.5)
nRBC: 0 % (ref 0.0–0.2)

## 2020-10-28 LAB — TROPONIN I (HIGH SENSITIVITY): Troponin I (High Sensitivity): 3 ng/L (ref <18)

## 2020-10-28 LAB — HCG, QUANTITATIVE, PREGNANCY: hCG, Beta Chain, Quant, S: 1 m[IU]/mL (ref <5)

## 2020-10-28 LAB — ETHANOL: Alcohol, Ethyl (B): <10 mg/dL (ref <10)

## 2020-10-28 MED ORDER — SODIUM CHLORIDE 0.9 % IV BOLUS
1000.0000 mL | Freq: Once | INTRAVENOUS | Status: AC
  Administered 2020-10-28: 1000 mL via INTRAVENOUS

## 2020-10-28 MED ORDER — PANTOPRAZOLE SODIUM 40 MG IV SOLR
40.0000 mg | Freq: Once | INTRAVENOUS | Status: AC
  Administered 2020-10-29: 40 mg via INTRAVENOUS
  Filled 2020-10-28: qty 40

## 2020-10-28 MED ORDER — ONDANSETRON HCL 4 MG/2ML IJ SOLN
4.0000 mg | Freq: Once | INTRAMUSCULAR | Status: AC
  Administered 2020-10-28: 4 mg via INTRAVENOUS
  Filled 2020-10-28: qty 2

## 2020-10-28 NOTE — ED Provider Notes (Signed)
  Provider Note MRN:  [id number]  Arrival date & time: 10/29/20    ED Course and Medical Decision Making  Assumed care from Dr. Estell Harpin at shift change.  Unresponsive, now better after Narcan, plan is to observe for resedation for a few hours.  Multiple hours of observation with no re-sedation.  Work-up is reassuring, has tested positive for COVID-19.  Appropriate for discharge.  Procedures  Final Clinical Impressions(s) / ED Diagnoses     ICD-10-CM   1. Opiate overdose, undetermined intent, initial encounter (HCC)  T40.604A     2. COVID-19  U07.1       ED Discharge Orders          Ordered    pantoprazole (PROTONIX) 40 MG tablet  2 times daily        10/29/20 0141              Discharge Instructions      You were evaluated in the Emergency Department and after careful evaluation, we did not find any emergent condition requiring admission or further testing in the hospital.  Your exam/testing today was overall reassuring.  You have tested positive for COVID-19.  Please isolate from others per CDC recommendations.  Please return to the Emergency Department if you experience any worsening of your condition.  Thank you for allowing [place] to be a part of your care.       Elmer Sow. Pilar Plate, MD [facility] Emergency Medicine [facility] [email or web address]    Sabas Sous, MD 10/29/20 [phone]

## 2020-10-28 NOTE — ED Triage Notes (Signed)
BIB [facility] following unresponsive episode at home. Given 6 mg narcan PTA. Pt arrives drowsy but responsive.

## 2020-10-28 NOTE — ED Notes (Signed)
Patient refused I/O cath. Ambulated to bathroom, missed hat - unable to provide sample.

## 2020-10-28 NOTE — ED Provider Notes (Signed)
[facility] EMERGENCY DEPARTMENT Provider Note   CSN: [id number] Arrival date & time: 10/28/20  2224     History Chief Complaint  Patient presents with   Drug Overdose    Kristina Cardenas is a 45 y.o. female.  Patient was found down by the police.  She was given 4 of Narcan nasally because she was not breathing.  When the paramedics arrived they gave her another 2 nasally she still was not breathing but she had a good pulse.  After about 10 minutes she awoke by time she got the emergency department she was awake and oriented to person  The history is provided by the patient and medical records. No language interpreter was used.  Drug Overdose This is a new problem. The current episode started less than 1 hour ago. The problem occurs constantly. The problem has been resolved. Pertinent negatives include no chest pain. Nothing aggravates the symptoms. Nothing relieves the symptoms. She has tried nothing for the symptoms.      Past Medical History:  Diagnosis Date   Bell's palsy    COVID    GERD (gastroesophageal reflux disease)    History of stomach ulcers    Stroke [facility])     Patient Active Problem List   Diagnosis Date Noted   Nausea and vomiting    Gastric ulcer 04/03/2020   Gastric ulceration 04/03/2020   Acute hypokalemia 04/02/2020   Metabolic alkalosis 04/02/2020   Bell's palsy    Stroke [facility])    History of Upper GI bleed 10/01/2015   Gastroesophageal reflux disease 01/03/2002    Past Surgical History:  Procedure Laterality Date   ABDOMINAL SURGERY       OB History   No obstetric history on file.     History reviewed. No pertinent family history.  Social History   Tobacco Use   Smoking status: Every Day    Packs/day: 1.00    Pack years: 0.00    Types: Cigarettes   Smokeless tobacco: Never  Substance Use Topics   Alcohol use: Not Currently   Drug use: Not Currently    Home Medications Prior to Admission medications   Medication Sig Start  Date End Date Taking? Authorizing Provider  HYDROcodone-acetaminophen (NORCO) 10-325 MG tablet Take 1 tablet by mouth every 8 (eight) hours as needed. 03/08/20   [provider]  oxyCODONE-acetaminophen (PERCOCET/ROXICET) 5-325 MG tablet Take 1 tablet by mouth every 6 (six) hours as needed for severe pain. 08/16/20   Harris, Cammy Copa, PA-C  pantoprazole (PROTONIX) 40 MG tablet Take 1 tablet (40 mg total) by mouth daily. 08/16/20   Arthor Captain, PA-C  promethazine (PHENERGAN) 25 MG tablet Take 1 tablet (25 mg total) by mouth every 6 (six) hours as needed for nausea or vomiting. 08/16/20   Arthor Captain, PA-C  sucralfate (CARAFATE) 1 g tablet Take 1 tablet (1 g total) by mouth 4 (four) times daily -  with meals and at bedtime. 08/16/20   Arthor Captain, PA-C    Allergies    Sulfa antibiotics and Toradol [ketorolac tromethamine]  Review of Systems   Review of Systems  Unable to perform ROS: Mental status change  Cardiovascular:  Negative for chest pain.   Physical Exam Updated Vital Signs BP 126/78   Pulse (!) 103   Resp 16   Ht 5\' 8"  (1.727 m)   Wt 77.1 kg   SpO2 96%   BMI 25.84 kg/m   Physical Exam Vitals and nursing note reviewed.  Constitutional:  Appearance: She is well-developed.  HENT:     Head: Normocephalic.     Nose: Nose normal.  Eyes:     General: No scleral icterus.    Conjunctiva/sclera: Conjunctivae normal.  Neck:     Thyroid: No thyromegaly.  Cardiovascular:     Rate and Rhythm: Normal rate and regular rhythm.     Heart sounds: No murmur heard.   No friction rub. No gallop.  Pulmonary:     Breath sounds: No stridor. No wheezing or rales.  Chest:     Chest wall: No tenderness.  Abdominal:     General: There is no distension.     Tenderness: There is no abdominal tenderness. There is no rebound.  Musculoskeletal:        General: Normal range of motion.     Cervical back: Neck supple.  Lymphadenopathy:     Cervical: No cervical adenopathy.   Skin:    Findings: No erythema or rash.  Neurological:     Mental Status: She is alert.     Motor: No abnormal muscle tone.     Coordination: Coordination normal.     Comments: Oriented to person and place  Psychiatric:        Behavior: Behavior normal.    ED Results / Procedures / Treatments   Labs (all labs ordered are listed, but only abnormal results are displayed) Labs Reviewed  CBC WITH DIFFERENTIAL/PLATELET - Abnormal; Notable for the following components:      Result Value   MCV 100.3 (*)    All other components within normal limits  RESP PANEL BY RT-PCR (FLU A&B, COVID) ARPGX2  ETHANOL  COMPREHENSIVE METABOLIC PANEL  RAPID URINE DRUG SCREEN, HOSP PERFORMED  URINALYSIS, ROUTINE W REFLEX MICROSCOPIC  HCG, QUANTITATIVE, PREGNANCY  TROPONIN I (HIGH SENSITIVITY)    EKG None  Radiology DG Chest Port 1 View  Result Date: 10/28/2020 CLINICAL DATA:  Unresponsive. EXAM: PORTABLE CHEST 1 VIEW COMPARISON:  April 02, 2020 FINDINGS: There is no evidence of acute infiltrate, pleural effusion or pneumothorax. The heart size and mediastinal contours are within normal limits. Multiple chronic right-sided rib fractures are again seen. IMPRESSION: No acute or active cardiopulmonary disease. Electronically Signed   By: Aram Candela M.D.   On: 10/28/2020 22:42    Procedures Procedures   Medications Ordered in ED Medications  pantoprazole (PROTONIX) injection 40 mg (has no administration in time range)  sodium chloride 0.9 % bolus 1,000 mL (1,000 mLs Intravenous New Bag/Given 10/28/20 2231)  ondansetron (ZOFRAN) injection 4 mg (4 mg Intravenous Given 10/28/20 2252)   CRITICAL CARE Performed by: Bethann Berkshire Total critical care time: 45 minutes Critical care time was exclusive of separately billable procedures and treating other patients. Critical care was necessary to treat or prevent imminent or life-threatening deterioration. Critical care was time spent personally by me  on the following activities: development of treatment plan with patient and/or surrogate as well as nursing, discussions with consultants, evaluation of patient's response to treatment, examination of patient, obtaining history from patient or surrogate, ordering and performing treatments and interventions, ordering and review of laboratory studies, ordering and review of radiographic studies, pulse oximetry and re-evaluation of patient's condition.  ED Course  I have reviewed the triage vital signs and the nursing notes.  Pertinent labs & imaging results that were available during my care of the patient were reviewed by me and considered in my medical decision making (see chart for details). Patient with narcotic overdose.  She improved  with 6 mg of Narcan nasally.  Labs pending.  Disposition will be taken care of by my colleague   MDM Rules/Calculators/A&P                           Final Clinical Impression(s) / ED Diagnoses Final diagnoses:  None    Rx / DC Orders ED Discharge Orders     None        Bethann Berkshire, MD 10/29/20 1146

## 2020-10-29 LAB — RESP PANEL BY RT-PCR (FLU A&B, COVID) ARPGX2
Influenza A by PCR: NEGATIVE
Influenza B by PCR: NEGATIVE
SARS Coronavirus 2 by RT PCR: POSITIVE — AB

## 2020-10-29 LAB — TROPONIN I (HIGH SENSITIVITY): Troponin I (High Sensitivity): 4 ng/L (ref <18)

## 2020-10-29 MED ORDER — PANTOPRAZOLE SODIUM 40 MG PO TBEC: 40.0000 mg | DELAYED_RELEASE_TABLET | Freq: Two times a day (BID) | ORAL | 1 refills | Status: DC

## 2020-10-29 MED ORDER — LIDOCAINE VISCOUS HCL 2 % MT SOLN
15.0000 mL | Freq: Once | OROMUCOSAL | Status: AC
  Administered 2020-10-29: 15 mL via ORAL
  Filled 2020-10-29: qty 15

## 2020-10-29 MED ORDER — ALUM & MAG HYDROXIDE-SIMETH 200-200-20 MG/5ML PO SUSP
30.0000 mL | Freq: Once | ORAL | Status: AC
  Administered 2020-10-29: 30 mL via ORAL
  Filled 2020-10-29: qty 30

## 2020-10-29 NOTE — Discharge Instructions (Addendum)
You were evaluated in the Emergency Department and after careful evaluation, we did not find any emergent condition requiring admission or further testing in the hospital.  Your exam/testing today was overall reassuring.  You have tested positive for COVID-19.  Please isolate from others per CDC recommendations.  Please return to the Emergency Department if you experience any worsening of your condition.  Thank you for allowing [place] to be a part of your care.

## 2020-12-24 ENCOUNTER — Encounter (HOSPITAL_COMMUNITY): Payer: Self-pay | Admitting: *Deleted

## 2020-12-24 ENCOUNTER — Other Ambulatory Visit: Payer: Self-pay

## 2020-12-24 ENCOUNTER — Inpatient Hospital Stay (HOSPITAL_COMMUNITY)
Admission: EM | Admit: 2020-12-24 | Discharge: 2020-12-26 | DRG: 392 | Discharge status: Against Medical Advice | Payer: Medicaid Other | Attending: Internal Medicine | Admitting: Internal Medicine

## 2020-12-24 ENCOUNTER — Emergency Department (HOSPITAL_COMMUNITY): Payer: Medicaid Other

## 2020-12-24 DIAGNOSIS — Z720 Tobacco use: Secondary | ICD-10-CM

## 2020-12-24 DIAGNOSIS — Z20822 Contact with and (suspected) exposure to covid-19: Secondary | ICD-10-CM | POA: Diagnosis present

## 2020-12-24 DIAGNOSIS — Z8711 Personal history of peptic ulcer disease: Secondary | ICD-10-CM

## 2020-12-24 DIAGNOSIS — R109 Unspecified abdominal pain: Secondary | ICD-10-CM

## 2020-12-24 DIAGNOSIS — R748 Abnormal levels of other serum enzymes: Secondary | ICD-10-CM

## 2020-12-24 DIAGNOSIS — F17210 Nicotine dependence, cigarettes, uncomplicated: Secondary | ICD-10-CM | POA: Diagnosis present

## 2020-12-24 DIAGNOSIS — T39395A Adverse effect of other nonsteroidal anti-inflammatory drugs [NSAID], initial encounter: Secondary | ICD-10-CM | POA: Diagnosis present

## 2020-12-24 DIAGNOSIS — Z79899 Other long term (current) drug therapy: Secondary | ICD-10-CM

## 2020-12-24 DIAGNOSIS — Z8673 Personal history of transient ischemic attack (TIA), and cerebral infarction without residual deficits: Secondary | ICD-10-CM

## 2020-12-24 DIAGNOSIS — R112 Nausea with vomiting, unspecified: Secondary | ICD-10-CM | POA: Diagnosis present

## 2020-12-24 DIAGNOSIS — I10 Essential (primary) hypertension: Secondary | ICD-10-CM | POA: Diagnosis present

## 2020-12-24 DIAGNOSIS — E785 Hyperlipidemia, unspecified: Secondary | ICD-10-CM | POA: Diagnosis present

## 2020-12-24 DIAGNOSIS — Z882 Allergy status to sulfonamides status: Secondary | ICD-10-CM

## 2020-12-24 DIAGNOSIS — Z888 Allergy status to other drugs, medicaments and biological substances status: Secondary | ICD-10-CM

## 2020-12-24 DIAGNOSIS — K259 Gastric ulcer, unspecified as acute or chronic, without hemorrhage or perforation: Secondary | ICD-10-CM | POA: Diagnosis present

## 2020-12-24 DIAGNOSIS — K219 Gastro-esophageal reflux disease without esophagitis: Secondary | ICD-10-CM | POA: Diagnosis present

## 2020-12-24 DIAGNOSIS — Z8616 Personal history of COVID-19: Secondary | ICD-10-CM

## 2020-12-24 DIAGNOSIS — D75839 Thrombocytosis, unspecified: Secondary | ICD-10-CM

## 2020-12-24 DIAGNOSIS — K2970 Gastritis, unspecified, without bleeding: Principal | ICD-10-CM | POA: Diagnosis present

## 2020-12-24 LAB — COMPREHENSIVE METABOLIC PANEL
ALT: 13 U/L (ref 0–44)
AST: 19 U/L (ref 15–41)
Albumin: 3.9 g/dL (ref 3.5–5.0)
Alkaline Phosphatase: 204 U/L — ABNORMAL HIGH (ref 38–126)
Anion gap: 10 (ref 5–15)
BUN: 15 mg/dL (ref 6–20)
CO2: 26 mmol/L (ref 22–32)
Calcium: 9.3 mg/dL (ref 8.9–10.3)
Chloride: 98 mmol/L (ref 98–111)
Creatinine, Ser: 0.61 mg/dL (ref 0.44–1.00)
GFR, Estimated: >60 mL/min (ref >60)
Glucose, Bld: 90 mg/dL (ref 70–99)
Potassium: 3.5 mmol/L (ref 3.5–5.1)
Sodium: 134 mmol/L — ABNORMAL LOW (ref 135–145)
Total Bilirubin: 0.5 mg/dL (ref 0.3–1.2)
Total Protein: 8.4 g/dL — ABNORMAL HIGH (ref 6.5–8.1)

## 2020-12-24 LAB — CBC WITH DIFFERENTIAL/PLATELET
Abs Immature Granulocytes: 0.03 10*3/uL (ref 0.00–0.07)
Basophils Absolute: 0 10*3/uL (ref 0.0–0.1)
Basophils Relative: 0 %
Eosinophils Absolute: 0.1 10*3/uL (ref 0.0–0.5)
Eosinophils Relative: 1 %
HCT: 47.6 % — ABNORMAL HIGH (ref 36.0–46.0)
Hemoglobin: 15.8 g/dL — ABNORMAL HIGH (ref 12.0–15.0)
Immature Granulocytes: 0 %
Lymphocytes Relative: 22 %
Lymphs Abs: 1.7 10*3/uL (ref 0.7–4.0)
MCH: 30.9 pg (ref 26.0–34.0)
MCHC: 33.2 g/dL (ref 30.0–36.0)
MCV: 93 fL (ref 80.0–100.0)
Monocytes Absolute: 0.3 10*3/uL (ref 0.1–1.0)
Monocytes Relative: 4 %
Neutro Abs: 5.7 10*3/uL (ref 1.7–7.7)
Neutrophils Relative %: 73 %
Platelets: 406 10*3/uL — ABNORMAL HIGH (ref 150–400)
RBC: 5.12 MIL/uL — ABNORMAL HIGH (ref 3.87–5.11)
RDW: 14.3 % (ref 11.5–15.5)
WBC: 7.9 10*3/uL (ref 4.0–10.5)
nRBC: 0 % (ref 0.0–0.2)

## 2020-12-24 LAB — HCG, SERUM, QUALITATIVE: Preg, Serum: NEGATIVE

## 2020-12-24 LAB — LIPASE, BLOOD: Lipase: 21 U/L (ref 11–51)

## 2020-12-24 MED ORDER — SODIUM CHLORIDE 0.9 % IV SOLN
2.0000 g | Freq: Three times a day (TID) | INTRAVENOUS | Status: DC
  Administered 2020-12-25 – 2020-12-26 (×4): 2 g via INTRAVENOUS
  Filled 2020-12-24 (×4): qty 2

## 2020-12-24 MED ORDER — IOHEXOL 350 MG/ML SOLN
75.0000 mL | Freq: Once | INTRAVENOUS | Status: AC | PRN
  Administered 2020-12-24: 75 mL via INTRAVENOUS

## 2020-12-24 MED ORDER — PANTOPRAZOLE SODIUM 40 MG IV SOLR
40.0000 mg | Freq: Once | INTRAVENOUS | Status: AC
  Administered 2020-12-24: 40 mg via INTRAVENOUS
  Filled 2020-12-24: qty 40

## 2020-12-24 MED ORDER — MORPHINE SULFATE (PF) 4 MG/ML IV SOLN
4.0000 mg | Freq: Once | INTRAVENOUS | Status: AC
  Administered 2020-12-24: 4 mg via INTRAVENOUS
  Filled 2020-12-24: qty 1

## 2020-12-24 MED ORDER — SODIUM CHLORIDE 0.9 % IV BOLUS
1000.0000 mL | Freq: Once | INTRAVENOUS | Status: AC
  Administered 2020-12-24: 1000 mL via INTRAVENOUS

## 2020-12-24 MED ORDER — LIDOCAINE VISCOUS HCL 2 % MT SOLN
15.0000 mL | Freq: Once | OROMUCOSAL | Status: AC
  Administered 2020-12-24: 15 mL via ORAL
  Filled 2020-12-24: qty 15

## 2020-12-24 MED ORDER — METOCLOPRAMIDE HCL 5 MG/ML IJ SOLN
10.0000 mg | Freq: Once | INTRAMUSCULAR | Status: AC
  Administered 2020-12-24: 10 mg via INTRAVENOUS
  Filled 2020-12-24: qty 2

## 2020-12-24 MED ORDER — SODIUM CHLORIDE 0.9 % IV SOLN
2.0000 g | Freq: Once | INTRAVENOUS | Status: AC
  Administered 2020-12-25: 2 g via INTRAVENOUS
  Filled 2020-12-24: qty 2

## 2020-12-24 MED ORDER — PANTOPRAZOLE INFUSION (NEW) - SIMPLE MED
8.0000 mg/h | INTRAVENOUS | Status: DC
  Administered 2020-12-25 – 2020-12-26 (×4): 8 mg/h via INTRAVENOUS
  Filled 2020-12-24 (×6): qty 100
  Filled 2020-12-24: qty 80

## 2020-12-24 MED ORDER — FENTANYL CITRATE PF 50 MCG/ML IJ SOSY
50.0000 ug | PREFILLED_SYRINGE | Freq: Once | INTRAMUSCULAR | Status: AC
  Administered 2020-12-24: 50 ug via INTRAVENOUS
  Filled 2020-12-24: qty 1

## 2020-12-24 MED ORDER — HYDROMORPHONE HCL 1 MG/ML IJ SOLN
1.0000 mg | Freq: Once | INTRAMUSCULAR | Status: AC
  Administered 2020-12-24: 1 mg via INTRAVENOUS
  Filled 2020-12-24: qty 1

## 2020-12-24 MED ORDER — METRONIDAZOLE 500 MG/100ML IV SOLN
500.0000 mg | Freq: Once | INTRAVENOUS | Status: AC
Start: 2020-12-24 — End: 2020-12-25
  Administered 2020-12-25: 500 mg via INTRAVENOUS
  Filled 2020-12-24: qty 100

## 2020-12-24 MED ORDER — FAMOTIDINE IN NACL 20-0.9 MG/50ML-% IV SOLN
20.0000 mg | Freq: Once | INTRAVENOUS | Status: AC
  Administered 2020-12-24: 20 mg via INTRAVENOUS
  Filled 2020-12-24: qty 50

## 2020-12-24 MED ORDER — ONDANSETRON HCL 4 MG/2ML IJ SOLN
4.0000 mg | Freq: Once | INTRAMUSCULAR | Status: AC
  Administered 2020-12-24: 4 mg via INTRAVENOUS
  Filled 2020-12-24: qty 2

## 2020-12-24 MED ORDER — ALUM & MAG HYDROXIDE-SIMETH 200-200-20 MG/5ML PO SUSP
30.0000 mL | Freq: Once | ORAL | Status: AC
  Administered 2020-12-24: 30 mL via ORAL
  Filled 2020-12-24: qty 30

## 2020-12-24 NOTE — ED Notes (Signed)
RN entered Pts room to stop IV fluids. Pt asleep resting comfortably. Upon the phone ringing in there room and this RN waking Pt up to speak to friend on the phone, Pt began rolling back and forth in bed c/o of pain and nausea. Pt states pain and nausea are worse than before and she is requesting more medication. EDP notified.

## 2020-12-24 NOTE — ED Provider Notes (Signed)
  Provider Note MRN:  [id number]  Arrival date & time: 12/24/20    ED Course and Medical Decision Making  Assumed care from Dr. Bernette Mayers at shift change.  Abdominal pain, CT with gastric ulcer, question of free air.  Has been discussed with general surgery, plan is to admit to medicine for serial abdominal exams, antibiotics, Protonix, surgery will see in the morning.  No peritoneal signs on exam.  .Critical Care  Date/Time: 12/24/2020 11:41 PM Performed by: Sabas Sous, MD Authorized by: Sabas Sous, MD   Critical care provider statement:    Critical care time (minutes):  35   Critical care was necessary to treat or prevent imminent or life-threatening deterioration of the following conditions: Concern for perforated gastric ulcer.   Critical care was time spent personally by me on the following activities:  Discussions with consultants, evaluation of patient's response to treatment, examination of patient, ordering and performing treatments and interventions, ordering and review of laboratory studies, ordering and review of radiographic studies, pulse oximetry, re-evaluation of patient's condition, obtaining history from patient or surrogate and review of old charts  Final Clinical Impressions(s) / ED Diagnoses     ICD-10-CM   1. Abdominal pain, unspecified abdominal location  R10.9       ED Discharge Orders     None       Discharge Instructions   None     Elmer Sow. Pilar Plate, MD [facility] Emergency Medicine [facility] [email or web address]    Sabas Sous, MD 12/24/20 [phone]

## 2020-12-24 NOTE — ED Notes (Signed)
Upon entering Pts room, RN observed Pt resting comfortably. However, upon waking Pt to perform fluid challenge, Pt began rolling back and forth in pain and c/o nausea. RN administered prescribed medications per EDP.

## 2020-12-24 NOTE — ED Provider Notes (Signed)
[facility]GENCY [facility]PARTMENT Provider Note   CSN: [id number] Arrival date & time: 12/24/20  1830     History Chief Complaint  Patient presents with   Abdominal Pain    Kristina Cardenas is a 45 y.o. female.  HPI  45 year old female with a history of Bell's palsy, COVID, GERD, stomach ulcers, CVA, who presents to the emergency department today for evaluation of abdominal pain.  States that belly pain has been going on for the last week.  She had associated nausea and vomiting.  She denies any diarrhea, constipation, fevers or urinary symptoms.  She has a history of ulcers and states this feels the same.  She ran out of her PPI 2 weeks ago  Past Medical History:  Diagnosis Date   Bell's palsy    COVID    GERD (gastroesophageal reflux disease)    History of stomach ulcers    Stroke [facility](HCC)     Patient Active Problem List   Diagnosis Date Noted   Nausea and vomiting    Gastric ulcer 04/03/2020   Gastric ulceration 04/03/2020   Acute hypokalemia 04/02/2020   Metabolic alkalosis 04/02/2020   Bell's palsy    Stroke [facility](HCC)    History of Upper GI bleed 10/01/2015   Gastroesophageal reflux disease 01/03/2002    Past Surgical History:  Procedure Laterality Date   ABDOMINAL SURGERY       OB History   No obstetric history on file.     No family history on file.  Social History   Tobacco Use   Smoking status: Every Day    Packs/day: 0.50    Types: Cigarettes   Smokeless tobacco: Never  Vaping Use   Vaping Use: Never used  Substance Use Topics   Alcohol use: Not Currently   Drug use: Not Currently    Home Medications Prior to Admission medications   Medication Sig Start Date End Date Taking? Authorizing Provider  Acetaminophen (TYLENOL) 325 MG CAPS Take 3 tablets by mouth daily as needed.   Yes [provider]  pantoprazole (PROTONIX) 40 MG tablet Take 1 tablet (40 mg total) by mouth 2 (two) times daily. 10/29/20  Yes [name], [name], MD   HYDROcodone-acetaminophen (NORCO) 10-325 MG tablet Take 1 tablet by mouth every 8 (eight) hours as needed. Patient not taking: Reported on 12/24/2020 03/08/20   [provider]  oxyCODONE-acetaminophen (PERCOCET/ROXICET) 5-325 MG tablet Take 1 tablet by mouth every 6 (six) hours as needed for severe pain. Patient not taking: Reported on 12/24/2020 08/16/20   Arthor CaptainHarris, Abigail, PA-C  promethazine (PHENERGAN) 25 MG tablet Take 1 tablet (25 mg total) by mouth every 6 (six) hours as needed for nausea or vomiting. Patient not taking: Reported on 12/24/2020 08/16/20   Arthor CaptainHarris, Abigail, PA-C  sucralfate (CARAFATE) 1 g tablet Take 1 tablet (1 g total) by mouth 4 (four) times daily -  with meals and at bedtime. Patient not taking: Reported on 12/24/2020 08/16/20   Arthor CaptainHarris, Abigail, PA-C    Allergies    Sulfa antibiotics and Toradol [ketorolac tromethamine]  Review of Systems   Review of Systems  Constitutional:  Negative for fever.  HENT:  Negative for ear pain and sore throat.   Eyes:  Negative for visual disturbance.  Respiratory:  Negative for cough and shortness of breath.   Cardiovascular:  Negative for chest pain.  Gastrointestinal:  Positive for abdominal pain, nausea and vomiting. Negative for constipation and diarrhea.  Genitourinary:  Negative for dysuria and hematuria.  Musculoskeletal:  Negative for back pain.  Skin:  Negative for rash.  Neurological:  Negative for seizures and syncope.  All other systems reviewed and are negative.  Physical Exam Updated Vital Signs BP (!) 153/91   Pulse 71   Temp 97.8 F (36.6 C) (Oral)   Resp 18   Ht 5\' 8"  (1.727 m)   Wt 70.3 kg   SpO2 96%   BMI 23.57 kg/m   Physical Exam Vitals and nursing note reviewed.  Constitutional:      General: She is not in acute distress.    Appearance: She is well-developed.  HENT:     Head: Normocephalic and atraumatic.  Eyes:     Conjunctiva/sclera: Conjunctivae normal.  Cardiovascular:     Rate and  Rhythm: Normal rate and regular rhythm.     Heart sounds: Normal heart sounds. No murmur heard. Pulmonary:     Effort: Pulmonary effort is normal. No respiratory distress.     Breath sounds: Normal breath sounds. No wheezing, rhonchi or rales.  Abdominal:     Palpations: Abdomen is soft.     Tenderness: There is abdominal tenderness in the right upper quadrant, epigastric area and left upper quadrant. There is no rebound.  Musculoskeletal:     Cervical back: Neck supple.  Skin:    General: Skin is warm and dry.  Neurological:     Mental Status: She is alert.    ED Results / Procedures / Treatments   Labs (all labs ordered are listed, but only abnormal results are displayed) Labs Reviewed  CBC WITH DIFFERENTIAL/PLATELET - Abnormal; Notable for the following components:      Result Value   RBC 5.12 (*)    Hemoglobin 15.8 (*)    HCT 47.6 (*)    Platelets 406 (*)    All other components within normal limits  COMPREHENSIVE METABOLIC PANEL - Abnormal; Notable for the following components:   Sodium 134 (*)    Total Protein 8.4 (*)    Alkaline Phosphatase 204 (*)    All other components within normal limits  LIPASE, BLOOD  HCG, SERUM, QUALITATIVE  URINALYSIS, ROUTINE W REFLEX MICROSCOPIC    EKG None  Radiology CT ABDOMEN PELVIS W CONTRAST  Result Date: 12/24/2020 CLINICAL DATA:  Abdominal pain x1 week. EXAM: CT ABDOMEN AND PELVIS WITH CONTRAST TECHNIQUE: Multidetector CT imaging of the abdomen and pelvis was performed using the standard protocol following bolus administration of intravenous contrast. CONTRAST:  54mL OMNIPAQUE IOHEXOL 350 MG/ML SOLN COMPARISON:  August 16, 2020 FINDINGS: Lower chest: No acute abnormality. Hepatobiliary: No focal liver abnormality is seen. Mild central intrahepatic biliary dilatation is noted. The gallbladder is mildly distended without evidence of gallstones or gallbladder wall thickening. The common bile duct measures approximately 7 mm. Pancreas:  Unremarkable. No pancreatic ductal dilatation or surrounding inflammatory changes. Spleen: Normal in size without focal abnormality. Adrenals/Urinary Tract: Adrenal glands are unremarkable. Kidneys are normal, without renal calculi, focal lesion, or hydronephrosis. Bladder is unremarkable. Stomach/Bowel: Gastric wall thickening is again seen, most prominent within the region of the gastric antrum/pylorus. Adjacent inflammatory fat stranding and fluid are noted. A 7 mm focus of air attenuation is seen along the posterior wall of the gastric antrum (axial CT image 27, CT series 2). Appendix appears normal. No evidence of bowel dilatation. Noninflamed diverticula are seen throughout the sigmoid colon. Vascular/Lymphatic: Aortic atherosclerosis. No enlarged abdominal or pelvic lymph nodes. Reproductive: There is diffuse enlargement of the lower uterine segment (axial CT images 80 through 84,  CT series 2). A 2.9 cm x 2.6 cm right adnexal cyst is seen. Other: No abdominal wall hernia or abnormality. No abdominopelvic ascites. Musculoskeletal: Stable grade 1 anterolisthesis of the L5 vertebral body is noted on S1. IMPRESSION: 1. Findings consistent with gastritis involving the gastric antrum. 2. 7 mm focus of air attenuation along the posterior wall of the gastric antrum which may represent a small gastric ulcer. A small focus of contained free air can not completely be excluded. 3. Diffuse enlargement of the lower uterine segment. While this may represent a fibroid, correlation with pelvic ultrasound is recommended. 4. 2.9 cm x 2.6 cm right adnexal cyst, likely ovarian in origin. 5. Stable grade 1 anterolisthesis of the L5 vertebral body on S1. 6. Aortic atherosclerosis. Aortic Atherosclerosis (ICD10-I70.0). Electronically Signed   By: Aram Candela M.D.   On: 12/24/2020 22:13    Procedures Procedures   Medications Ordered in ED Medications  ceFEPIme (MAXIPIME) 2 g in sodium chloride 0.9 % 100 mL IVPB (has no  administration in time range)  metroNIDAZOLE (FLAGYL) IVPB 500 mg (has no administration in time range)  sodium chloride 0.9 % bolus 1,000 mL (0 mLs Intravenous Stopped 12/24/20 2134)  famotidine (PEPCID) IVPB 20 mg premix (0 mg Intravenous Stopped 12/24/20 2036)  pantoprazole (PROTONIX) injection 40 mg (40 mg Intravenous Given 12/24/20 2012)  alum & mag hydroxide-simeth (MAALOX/MYLANTA) 200-200-20 MG/5ML suspension 30 mL (30 mLs Oral Given 12/24/20 2013)    And  lidocaine (XYLOCAINE) 2 % viscous mouth solution 15 mL (15 mLs Oral Given 12/24/20 2013)  fentaNYL (SUBLIMAZE) injection 50 mcg (50 mcg Intravenous Given 12/24/20 2013)  morphine 4 MG/ML injection 4 mg (4 mg Intravenous Given 12/24/20 2033)  ondansetron (ZOFRAN) injection 4 mg (4 mg Intravenous Given 12/24/20 2034)  iohexol (OMNIPAQUE) 350 MG/ML injection 75 mL (75 mLs Intravenous Contrast Given 12/24/20 2149)  metoCLOPramide (REGLAN) injection 10 mg (10 mg Intravenous Given 12/24/20 2237)  HYDROmorphone (DILAUDID) injection 1 mg (1 mg Intravenous Given 12/24/20 2238)    ED Course  I have reviewed the triage vital signs and the nursing notes.  Pertinent labs & imaging results that were available during my care of the patient were reviewed by me and considered in my medical decision making (see chart for details).    MDM Rules/Calculators/A&P                          45 year old female presenting the emergency department today for evaluation of abdominal pain that she feels consistent with her prior history of peptic ulcers.  She ran out of her PPI 2 weeks ago.  She had associated nausea and vomiting  Reviewed/interpreted labs CBC with elevated hgb, otherwise reassuring CMP with mild hyponatremia Lipase negative Beta hcg negative UA pending  Patient was given IV fluids, Pepcid, Protonix, GI cocktail and pain meds.  On reassessment she is still c/o pain and nausea. Will order more meds  Ct abd/pelvis - 1. Findings consistent with  gastritis involving the gastric antrum. 2. 7 mm focus of air attenuation along the posterior wall of the gastric antrum which may represent a small gastric ulcer. A small focus of contained free air can not completely be excluded. 3. Diffuse enlargement of the lower uterine segment. While this may represent a fibroid, correlation with pelvic ultrasound is recommended. 4. 2.9 cm x 2.6 cm right adnexal cyst, likely ovarian in origin. 5. Stable grade 1 anterolisthesis of the L5 vertebral body on S1.  6. Aortic atherosclerosis. Aortic Atherosclerosis  11:27 PM CONSULT with Dr. Claudine Mouton with general surgery who recommends iv abx, ppi drip and admit. He will consult.   At shift change, care transitioned to Dr. Pilar Plate with plan to f/u pending hospitalist admission   Final Clinical Impression(s) / ED Diagnoses Final diagnoses:  Abdominal pain, unspecified abdominal location    Rx / DC Orders ED Discharge Orders     None        Rayne Du 12/24/20 2335    Pollyann Savoy, MD 12/25/20 1501

## 2020-12-24 NOTE — ED Triage Notes (Signed)
Pt brought in by [facility] from home with c/o abdominal pain x 1 week with hx of ulcers. Pt reports pain has worsened over the past few hours. Pt reports to EMS that she hasn't ate in 1 week.

## 2020-12-25 ENCOUNTER — Encounter (HOSPITAL_COMMUNITY): Payer: Self-pay | Admitting: Internal Medicine

## 2020-12-25 DIAGNOSIS — K253 Acute gastric ulcer without hemorrhage or perforation: Secondary | ICD-10-CM

## 2020-12-25 DIAGNOSIS — F17210 Nicotine dependence, cigarettes, uncomplicated: Secondary | ICD-10-CM | POA: Diagnosis present

## 2020-12-25 DIAGNOSIS — K255 Chronic or unspecified gastric ulcer with perforation: Secondary | ICD-10-CM | POA: Diagnosis not present

## 2020-12-25 DIAGNOSIS — R109 Unspecified abdominal pain: Secondary | ICD-10-CM

## 2020-12-25 DIAGNOSIS — Z8616 Personal history of COVID-19: Secondary | ICD-10-CM | POA: Diagnosis not present

## 2020-12-25 DIAGNOSIS — R1010 Upper abdominal pain, unspecified: Secondary | ICD-10-CM

## 2020-12-25 DIAGNOSIS — K259 Gastric ulcer, unspecified as acute or chronic, without hemorrhage or perforation: Secondary | ICD-10-CM | POA: Diagnosis present

## 2020-12-25 DIAGNOSIS — R748 Abnormal levels of other serum enzymes: Secondary | ICD-10-CM

## 2020-12-25 DIAGNOSIS — Z888 Allergy status to other drugs, medicaments and biological substances status: Secondary | ICD-10-CM | POA: Diagnosis not present

## 2020-12-25 DIAGNOSIS — I10 Essential (primary) hypertension: Secondary | ICD-10-CM | POA: Diagnosis present

## 2020-12-25 DIAGNOSIS — T39395A Adverse effect of other nonsteroidal anti-inflammatory drugs [NSAID], initial encounter: Secondary | ICD-10-CM | POA: Diagnosis present

## 2020-12-25 DIAGNOSIS — K219 Gastro-esophageal reflux disease without esophagitis: Secondary | ICD-10-CM

## 2020-12-25 DIAGNOSIS — Z20822 Contact with and (suspected) exposure to covid-19: Secondary | ICD-10-CM | POA: Diagnosis present

## 2020-12-25 DIAGNOSIS — Z720 Tobacco use: Secondary | ICD-10-CM

## 2020-12-25 DIAGNOSIS — D75839 Thrombocytosis, unspecified: Secondary | ICD-10-CM | POA: Diagnosis present

## 2020-12-25 DIAGNOSIS — Z882 Allergy status to sulfonamides status: Secondary | ICD-10-CM | POA: Diagnosis not present

## 2020-12-25 DIAGNOSIS — Z8711 Personal history of peptic ulcer disease: Secondary | ICD-10-CM | POA: Diagnosis not present

## 2020-12-25 DIAGNOSIS — E785 Hyperlipidemia, unspecified: Secondary | ICD-10-CM | POA: Diagnosis present

## 2020-12-25 DIAGNOSIS — R112 Nausea with vomiting, unspecified: Secondary | ICD-10-CM

## 2020-12-25 DIAGNOSIS — Z79899 Other long term (current) drug therapy: Secondary | ICD-10-CM | POA: Diagnosis not present

## 2020-12-25 DIAGNOSIS — K2970 Gastritis, unspecified, without bleeding: Secondary | ICD-10-CM | POA: Diagnosis present

## 2020-12-25 DIAGNOSIS — Z8673 Personal history of transient ischemic attack (TIA), and cerebral infarction without residual deficits: Secondary | ICD-10-CM | POA: Diagnosis not present

## 2020-12-25 LAB — COMPREHENSIVE METABOLIC PANEL
ALT: 9 U/L (ref 0–44)
AST: 14 U/L — ABNORMAL LOW (ref 15–41)
Albumin: 3 g/dL — ABNORMAL LOW (ref 3.5–5.0)
Alkaline Phosphatase: 153 U/L — ABNORMAL HIGH (ref 38–126)
Anion gap: 7 (ref 5–15)
BUN: 13 mg/dL (ref 6–20)
CO2: 24 mmol/L (ref 22–32)
Calcium: 8.6 mg/dL — ABNORMAL LOW (ref 8.9–10.3)
Chloride: 105 mmol/L (ref 98–111)
Creatinine, Ser: 0.61 mg/dL (ref 0.44–1.00)
GFR, Estimated: >60 mL/min (ref >60)
Glucose, Bld: 76 mg/dL (ref 70–99)
Potassium: 3.8 mmol/L (ref 3.5–5.1)
Sodium: 136 mmol/L (ref 135–145)
Total Bilirubin: 0.5 mg/dL (ref 0.3–1.2)
Total Protein: 6.6 g/dL (ref 6.5–8.1)

## 2020-12-25 LAB — PROTIME-INR
INR: 1.1 (ref 0.8–1.2)
Prothrombin Time: 13.8 seconds (ref 11.4–15.2)

## 2020-12-25 LAB — URINALYSIS, COMPLETE (UACMP) WITH MICROSCOPIC
Bilirubin Urine: NEGATIVE
Glucose, UA: NEGATIVE mg/dL
Ketones, ur: 20 mg/dL — AB
Nitrite: NEGATIVE
Protein, ur: 30 mg/dL — AB
RBC / HPF: >50 RBC/hpf — ABNORMAL HIGH (ref 0–5)
Specific Gravity, Urine: 1.035 — ABNORMAL HIGH (ref 1.005–1.030)
WBC, UA: >50 WBC/hpf — ABNORMAL HIGH (ref 0–5)
pH: 7 (ref 5.0–8.0)

## 2020-12-25 LAB — MRSA NEXT GEN BY PCR, NASAL: MRSA by PCR Next Gen: NOT DETECTED

## 2020-12-25 LAB — CBC
HCT: 38.5 % (ref 36.0–46.0)
Hemoglobin: 12.4 g/dL (ref 12.0–15.0)
MCH: 30.8 pg (ref 26.0–34.0)
MCHC: 32.2 g/dL (ref 30.0–36.0)
MCV: 95.8 fL (ref 80.0–100.0)
Platelets: 306 10*3/uL (ref 150–400)
RBC: 4.02 MIL/uL (ref 3.87–5.11)
RDW: 14.3 % (ref 11.5–15.5)
WBC: 6.7 10*3/uL (ref 4.0–10.5)
nRBC: 0 % (ref 0.0–0.2)

## 2020-12-25 LAB — RAPID URINE DRUG SCREEN, HOSP PERFORMED
Amphetamines: POSITIVE — AB
Barbiturates: NOT DETECTED
Benzodiazepines: NOT DETECTED
Cocaine: POSITIVE — AB
Opiates: POSITIVE — AB
Tetrahydrocannabinol: POSITIVE — AB

## 2020-12-25 LAB — APTT: aPTT: 29 seconds (ref 24–36)

## 2020-12-25 LAB — PHOSPHORUS: Phosphorus: 3.9 mg/dL (ref 2.5–4.6)

## 2020-12-25 LAB — MAGNESIUM: Magnesium: 1.8 mg/dL (ref 1.7–2.4)

## 2020-12-25 MED ORDER — SODIUM CHLORIDE 0.9 % IV SOLN: Freq: Once | INTRAVENOUS | Status: AC

## 2020-12-25 MED ORDER — HYDROMORPHONE HCL 1 MG/ML IJ SOLN
0.5000 mg | INTRAMUSCULAR | Status: DC | PRN
  Administered 2020-12-25 – 2020-12-26 (×12): 0.5 mg via INTRAVENOUS
  Filled 2020-12-25 (×12): qty 0.5

## 2020-12-25 MED ORDER — ACETAMINOPHEN 325 MG PO TABS: 650.0000 mg | ORAL_TABLET | Freq: Four times a day (QID) | ORAL | Status: DC | PRN

## 2020-12-25 MED ORDER — ONDANSETRON HCL 4 MG/2ML IJ SOLN
4.0000 mg | Freq: Four times a day (QID) | INTRAMUSCULAR | Status: DC | PRN
  Administered 2020-12-25 (×3): 4 mg via INTRAVENOUS
  Filled 2020-12-25 (×3): qty 2

## 2020-12-25 MED ORDER — CHLORHEXIDINE GLUCONATE CLOTH 2 % EX PADS
6.0000 | MEDICATED_PAD | Freq: Every day | CUTANEOUS | Status: DC
  Administered 2020-12-26: 6 via TOPICAL

## 2020-12-25 MED ORDER — ONDANSETRON HCL 4 MG/2ML IJ SOLN
4.0000 mg | Freq: Four times a day (QID) | INTRAMUSCULAR | Status: DC
  Administered 2020-12-25 – 2020-12-26 (×3): 4 mg via INTRAVENOUS
  Filled 2020-12-25 (×3): qty 2

## 2020-12-25 MED ORDER — HYDROMORPHONE HCL 1 MG/ML IJ SOLN
0.5000 mg | INTRAMUSCULAR | Status: DC | PRN
  Administered 2020-12-25 (×2): 0.5 mg via INTRAVENOUS
  Filled 2020-12-25 (×2): qty 0.5

## 2020-12-25 MED ORDER — METRONIDAZOLE 500 MG/100ML IV SOLN
500.0000 mg | Freq: Two times a day (BID) | INTRAVENOUS | Status: DC
  Administered 2020-12-25 – 2020-12-26 (×3): 500 mg via INTRAVENOUS
  Filled 2020-12-25 (×3): qty 100

## 2020-12-25 NOTE — Plan of Care (Signed)
Patient oriented to room and call bell use. Education and plan of care teaching completed with patient. Patient has no concerns or questions at this time.

## 2020-12-25 NOTE — H&P (Addendum)
History and Physical  Kristina Cardenas:[id number]:[id number] DOB: 10/12/1975 DOA: 12/24/2020  Referring physician: [name], [name], MD  PCP: Patient, No Pcp Per (Inactive)  Patient coming from: Home  Chief Complaint: Abdominal Pain  HPI: Kristina Cardenas is a 45 y.o. female with medical history significant for peptic ulcer disease, stroke GERD, Bell's palsy, history of CVA and COVID-positive (10/28/2020) who presents to the emergency department due to about 1 week onset of upper abdominal pain.  Abdominal pain was described as sharp with radiation to the back and was rated as 20/10 on pain scale, any form of oral intake worsens pain, there was no known alleviating factor.  She denies use of NSAIDs or Goody powders.  Abdominal pain was associated with nausea and vomiting (nonbloody), vomitus was yellowish-green with an acidic taste.  She denies fever, chest pain, shortness of breath, headache, diarrhea.  ED Course: In the emergency department, she was initially tachypneic, BP was 153/103 and other vital signs are within normal range.  Work-up in the ED showed mild thrombocytosis, H/H 15.8/47.6 and normal being except for sodium at 134, ALP at 204.  Lipase 21 CT abdomen and pelvis with contrast showed findings consistent with gastritis involving the gastric antrum. 7 mm focus of air attenuation along the posterior wall of the gastric antrum which may represent a small gastric ulcer. A small focus of contained free air can not completely be excluded. GI cocktail, Pepcid, pain medication including fentanyl and Dilaudid were given.  Reglan was given.  General surgery was consulted and recommended starting patient on IV antibiotics, Protonix drip and that he will consult with patient in the morning.  Hospitalist was asked to admit patient for further evaluation and management.  Review of Systems: Constitutional: Negative for chills and fever.  HENT: Negative for ear pain and sore throat.   Eyes: Negative for  pain and visual disturbance.  Respiratory: Negative for cough, chest tightness and shortness of breath.   Cardiovascular: Negative for chest pain and palpitations.  Gastrointestinal: Positive for abdominal pain, nausea and vomiting.  Endocrine: Negative for polyphagia and polyuria.  Genitourinary: Negative for decreased urine volume, dysuria, enuresis Musculoskeletal: Negative for arthralgias and back pain.  Skin: Negative for color change and rash.  Allergic/Immunologic: Negative for immunocompromised state.  Neurological: Negative for tremors, syncope, speech difficulty Hematological: Does not bruise/bleed easily.  All other systems reviewed and are negative  Past Medical History:  Diagnosis Date   Bell's palsy    COVID    GERD (gastroesophageal reflux disease)    History of stomach ulcers    Stroke [facility](HCC)    Past Surgical History:  Procedure Laterality Date   ABDOMINAL SURGERY      Social History:  reports that she has been smoking cigarettes. She has been smoking an average of .5 packs per day. She has never used smokeless tobacco. She reports that she does not currently use alcohol. She reports that she does not currently use drugs.   Allergies  Allergen Reactions   Sulfa Antibiotics    Toradol [Ketorolac Tromethamine]     No family history on file.   Prior to Admission medications   Medication Sig Start Date End Date Taking? Authorizing Provider  Acetaminophen (TYLENOL) 325 MG CAPS Take 3 tablets by mouth daily as needed.   Yes [provider]  pantoprazole (PROTONIX) 40 MG tablet Take 1 tablet (40 mg total) by mouth 2 (two) times daily. 10/29/20  Yes [name], [name], MD  HYDROcodone-acetaminophen [facility](NORCO) 10-325 MG tablet  Take 1 tablet by mouth every 8 (eight) hours as needed. Patient not taking: Reported on 12/24/2020 03/08/20   [provider]  oxyCODONE-acetaminophen (PERCOCET/ROXICET) 5-325 MG tablet Take 1 tablet by mouth every 6 (six) hours as  needed for severe pain. Patient not taking: Reported on 12/24/2020 08/16/20   Arthor Captain, PA-C  promethazine (PHENERGAN) 25 MG tablet Take 1 tablet (25 mg total) by mouth every 6 (six) hours as needed for nausea or vomiting. Patient not taking: Reported on 12/24/2020 08/16/20   Arthor Captain, PA-C  sucralfate (CARAFATE) 1 g tablet Take 1 tablet (1 g total) by mouth 4 (four) times daily -  with meals and at bedtime. Patient not taking: Reported on 12/24/2020 08/16/20   Arthor Captain, PA-C    Physical Exam: BP (!) 153/91   Pulse 71   Temp 97.8 F (36.6 C) (Oral)   Resp 18   Ht 5\' 8"  (1.727 m)   Wt 70.3 kg   SpO2 96%   BMI 23.57 kg/m   General: 45 y.o. year-old female well developed well nourished in no acute distress.  Alert and oriented x3. HEENT: NCAT, EOMI Neck: Supple, trachea medial Cardiovascular: Regular rate and rhythm with no rubs or gallops.  No thyromegaly or JVD noted.  No lower extremity edema. 2/4 pulses in all 4 extremities. Respiratory: Clear to auscultation with no wheezes or rales. Good inspiratory effort. Abdomen: Soft, tender to palpation of upper quadrants without guarding.  Surgical scar noted on abdomen Muskuloskeletal: No cyanosis, clubbing or edema noted bilaterally Neuro: CN II-XII intact, strength 5/5 x 4, sensation, reflexes intact Skin: No ulcerative lesions noted or rashes Psychiatry: Judgement and insight appear normal. Mood is appropriate for condition and setting          Labs on Admission:  Basic Metabolic Panel: Recent Labs  Lab 12/24/20 2006  NA 134*  K 3.5  CL 98  CO2 26  GLUCOSE 90  BUN 15  CREATININE 0.61  CALCIUM 9.3   Liver Function Tests: Recent Labs  Lab 12/24/20 2006  AST 19  ALT 13  ALKPHOS 204*  BILITOT 0.5  PROT 8.4*  ALBUMIN 3.9   Recent Labs  Lab 12/24/20 2006  LIPASE 21   No results for input(s): AMMONIA in the last 168 hours. CBC: Recent Labs  Lab 12/24/20 2006  WBC 7.9  NEUTROABS 5.7  HGB 15.8*   HCT 47.6*  MCV 93.0  PLT 406*   Cardiac Enzymes: No results for input(s): CKTOTAL, CKMB, CKMBINDEX, TROPONINI in the last 168 hours.  BNP (last 3 results) No results for input(s): BNP in the last 8760 hours.  ProBNP (last 3 results) No results for input(s): PROBNP in the last 8760 hours.  CBG: No results for input(s): GLUCAP in the last 168 hours.  Radiological Exams on Admission: CT ABDOMEN PELVIS W CONTRAST  Result Date: 12/24/2020 CLINICAL DATA:  Abdominal pain x1 week. EXAM: CT ABDOMEN AND PELVIS WITH CONTRAST TECHNIQUE: Multidetector CT imaging of the abdomen and pelvis was performed using the standard protocol following bolus administration of intravenous contrast. CONTRAST:  69mL OMNIPAQUE IOHEXOL 350 MG/ML SOLN COMPARISON:  August 16, 2020 FINDINGS: Lower chest: No acute abnormality. Hepatobiliary: No focal liver abnormality is seen. Mild central intrahepatic biliary dilatation is noted. The gallbladder is mildly distended without evidence of gallstones or gallbladder wall thickening. The common bile duct measures approximately 7 mm. Pancreas: Unremarkable. No pancreatic ductal dilatation or surrounding inflammatory changes. Spleen: Normal in size without focal abnormality. Adrenals/Urinary Tract:  Adrenal glands are unremarkable. Kidneys are normal, without renal calculi, focal lesion, or hydronephrosis. Bladder is unremarkable. Stomach/Bowel: Gastric wall thickening is again seen, most prominent within the region of the gastric antrum/pylorus. Adjacent inflammatory fat stranding and fluid are noted. A 7 mm focus of air attenuation is seen along the posterior wall of the gastric antrum (axial CT image 27, CT series 2). Appendix appears normal. No evidence of bowel dilatation. Noninflamed diverticula are seen throughout the sigmoid colon. Vascular/Lymphatic: Aortic atherosclerosis. No enlarged abdominal or pelvic lymph nodes. Reproductive: There is diffuse enlargement of the lower uterine  segment (axial CT images 80 through 84, CT series 2). A 2.9 cm x 2.6 cm right adnexal cyst is seen. Other: No abdominal wall hernia or abnormality. No abdominopelvic ascites. Musculoskeletal: Stable grade 1 anterolisthesis of the L5 vertebral body is noted on S1. IMPRESSION: 1. Findings consistent with gastritis involving the gastric antrum. 2. 7 mm focus of air attenuation along the posterior wall of the gastric antrum which may represent a small gastric ulcer. A small focus of contained free air can not completely be excluded. 3. Diffuse enlargement of the lower uterine segment. While this may represent a fibroid, correlation with pelvic ultrasound is recommended. 4. 2.9 cm x 2.6 cm right adnexal cyst, likely ovarian in origin. 5. Stable grade 1 anterolisthesis of the L5 vertebral body on S1. 6. Aortic atherosclerosis. Aortic Atherosclerosis (ICD10-I70.0). Electronically Signed   By: Aram Candela M.D.   On: 12/24/2020 22:13    EKG: I independently viewed the EKG done and my findings are as followed: EKG was not done in the ED  Assessment/Plan Present on Admission:  Gastric ulcer  Nausea and vomiting  Gastroesophageal reflux disease  Principal Problem:   Gastric ulcer Active Problems:   Gastroesophageal reflux disease   Nausea and vomiting   Abdominal pain   Thrombocytosis   Elevated alkaline phosphatase level  Abdominal pain, nausea and vomiting secondary to gastric ulcer, rule out perforated gastric ulcer CT abdomen pelvis suggestive of gastritis/gastric ulcer with suspicion for contained free air Patient states that she has had prior history of surgical repair of perforated gastric ulcer x2 Continue IV NS at 75 mLs/Hr Continue IV cefepime and Flagyl  Continue IV Protonix drip Continue IV Dilaudid 0.5 mg q.4h p.r.n. for moderate to severe pain Continue IV Zofran p.r.n. Continue n.p.o in anticipation for possible surgical intervention in the morning Obtain blood culture x2 General  surgery  was consulted and will follow up with patient in the morning  GERD Continue Protonix drip  Thrombocytosis possibly reactive Platelets 406; continue to monitor platelet levels with morning labs  Elevated ALP ALP 204; continue to monitor liver enzymes  Tobacco abuse Patient counseled on tobacco abuse cessation   DVT prophylaxis: SCDs  Code Status: Full code  Family Communication: None at bedside  Disposition Plan:  Patient is from:                        home Anticipated DC to:                   SNF or family members home Anticipated DC date:               2-3 days Anticipated DC barriers:         patient requires inpatient management due to abdominal pain with CT findings of gastric ulcer and pending surgical consult   Consults called: General surgery  Admission status:  Inpatient  Frankey Shown MD [facility]  12/25/2020, 1:18 AM

## 2020-12-25 NOTE — Progress Notes (Signed)
PROGRESS NOTE  Kristina Cardenas IWP:[id number] DOB: 02-25-1976 DOA: 12/24/2020 PCP: Patient, No Pcp Per (Inactive)  Brief History:  45 year old female with a history of stroke, peptic ulcer disease with gastric perforation, hypertension, hyperlipidemia, upper GI bleed, and cocaine abuse presenting with 1 week history of abdominal pain primarily located in the epigastric and left upper quadrant area.  She has had intermittent nausea and vomiting for the last 3 to 4 days without any hematemesis.  She continues to smoke 1/2 pack/day.  She has been taking Goody's powder regularly for her pain.  She denies any alcohol or illegal drugs.  She denies any fever, chills, chest pain, shortness of breath, hemoptysis, diarrhea, hematuria, hematochezia, melena. In the emergency department, the patient was afebrile and hemodynamically stable with oxygen saturation 98% room air.  CT of the abdomen and pelvis showed mild central intrahepatic biliary ductal dilatation.  There was gastric wall thickening in the region of the antrum and pylorus.  There was a 7 mm focus of air in the posterior wall of the gastric antrum, cannot rule out free air.  Assessment/Plan: Intractable abdominal pain, vomiting -12/24/2020 CT abdomen as discussed above -Concerned about gastric perforation -General surgery consulted -GI consult -Continue Protonix drip -Remain n.p.o. -Continue IV fluids -Judicious opioids  Polysubstance abuse -Patient currently refuses to give urine sample b/c she "does not want anything reported" -Previous urine sample shows cocaine 04/03/20 -Tobacco cessation discussed  Essential hypertension -Patient previously took lisinopril/HCTZ -Has not been on any antihypertensive medications for least 5 years -BP stable presently -monitor for now  Hyperlipidemia -Has not taking any medications for approximately 5 years -Previously on Zocor  History of Stroke -not on any antiplatelets--hold for now  in setting of possible gastric perforation      Status is: Inpatient  Remains inpatient appropriate because:Inpatient level of care appropriate due to severity of illness  Dispo: The patient is from: Home              Anticipated d/c is to: Home              Patient currently is not medically stable to d/c.   Difficult to place patient No        Family Communication:   no Family at bedside  Consultants:  GI, general surgery  Code Status:  FULL   DVT Prophylaxis:  [place] Heparin    Procedures: As Listed in Progress Note Above  Antibiotics: Cefepime 8/26>. Flagyl 8/26>>        Subjective:  Patient complains of nausea and abd pain.  Denies f/c, cp, sob, diarrhea, dysuria Objective: Vitals:   12/25/20 0400 12/25/20 0500 12/25/20 0600 12/25/20 0735  BP: (!) 153/89 134/77 (!) 153/84   Pulse: 69 67 73 69  Resp: 17 18 20    Temp:    98.8 F (37.1 C)  TempSrc:    Oral  SpO2: 98% 98% 97% 97%  Weight:      Height:        Intake/Output Summary (Last 24 hours) at 12/25/2020 0906 Last data filed at 12/25/2020 12/27/2020 Gross per 24 hour  Intake 1611.09 ml  Output --  Net 1611.09 ml   Weight change:  Exam:  General:  Pt is alert, follows commands appropriately, not in acute distress HEENT: No icterus, No thrush, No neck mass, Ryan/AT Cardiovascular: RRR, S1/S2, no rubs, no gallops Respiratory: diminished BS.  Bibasilar rales. No wheeze Abdomen: Soft/+BS, non tender, non distended,  no guarding Extremities: No edema, No lymphangitis, No petechiae, No rashes, no synovitis   Data Reviewed: I have personally reviewed following labs and imaging studies Basic Metabolic Panel: Recent Labs  Lab 12/24/20 2006 12/25/20 0325  NA 134* 136  K 3.5 3.8  CL 98 105  CO2 26 24  GLUCOSE 90 76  BUN 15 13  CREATININE 0.61 0.61  CALCIUM 9.3 8.6*  MG  --  1.8  PHOS  --  3.9   Liver Function Tests: Recent Labs  Lab 12/24/20 2006 12/25/20 0325  AST 19 14*  ALT 13 9   ALKPHOS 204* 153*  BILITOT 0.5 0.5  PROT 8.4* 6.6  ALBUMIN 3.9 3.0*   Recent Labs  Lab 12/24/20 2006  LIPASE 21   No results for input(s): AMMONIA in the last 168 hours. Coagulation Profile: Recent Labs  Lab 12/25/20 0325  INR 1.1   CBC: Recent Labs  Lab 12/24/20 2006 12/25/20 0325  WBC 7.9 6.7  NEUTROABS 5.7  --   HGB 15.8* 12.4  HCT 47.6* 38.5  MCV 93.0 95.8  PLT 406* 306   Cardiac Enzymes: No results for input(s): CKTOTAL, CKMB, CKMBINDEX, TROPONINI in the last 168 hours. BNP: Invalid input(s): POCBNP CBG: No results for input(s): GLUCAP in the last 168 hours. HbA1C: No results for input(s): HGBA1C in the last 72 hours. Urine analysis:    Component Value Date/Time   COLORURINE YELLOW 08/16/2020 1454   APPEARANCEUR CLOUDY (A) 08/16/2020 1454   LABSPEC 1.021 08/16/2020 1454   PHURINE 7.0 08/16/2020 1454   GLUCOSEU NEGATIVE 08/16/2020 1454   HGBUR SMALL (A) 08/16/2020 1454   BILIRUBINUR NEGATIVE 08/16/2020 1454   KETONESUR 20 (A) 08/16/2020 1454   PROTEINUR NEGATIVE 08/16/2020 1454   NITRITE NEGATIVE 08/16/2020 1454   LEUKOCYTESUR NEGATIVE 08/16/2020 1454   Sepsis Labs: @LABRCNTIP (procalcitonin:4,lacticidven:4) ) Recent Results (from the past 240 hour(s))  MRSA Next Gen by PCR, Nasal     Status: None   Collection Time: 12/25/20  2:35 AM   Specimen: Nasal Mucosa; Nasal Swab  Result Value Ref Range Status   MRSA by PCR Next Gen NOT DETECTED NOT DETECTED Final    Comment: (NOTE) The GeneXpert MRSA Assay (FDA approved for NASAL specimens only), is one component of a comprehensive MRSA colonization surveillance program. It is not intended to diagnose MRSA infection nor to guide or monitor treatment for MRSA infections. Test performance is not FDA approved in patients less than 1 years old. Performed at [facility], [address], [place], [place]   Culture, blood (Routine X 2) w Reflex to ID Panel     Status: None (Preliminary result)    Collection Time: 12/25/20  3:25 AM   Specimen: Left Antecubital; Blood  Result Value Ref Range Status   Specimen Description LEFT ANTECUBITAL  Final   Special Requests   Final    BOTTLES DRAWN AEROBIC AND ANAEROBIC Blood Culture adequate volume   Culture   Final    NO GROWTH < 12 HOURS Performed at [facility], [address], [place], [place]    Report Status PENDING  Incomplete  Culture, blood (Routine X 2) w Reflex to ID Panel     Status: None (Preliminary result)   Collection Time: 12/25/20  3:31 AM   Specimen: BLOOD LEFT HAND  Result Value Ref Range Status   Specimen Description BLOOD LEFT HAND  Final   Special Requests   Final    BOTTLES DRAWN AEROBIC AND ANAEROBIC Blood  Culture adequate volume   Culture   Final    NO GROWTH < 12 HOURS Performed at [facility], [address], [place], [place] [address]    Report Status PENDING  Incomplete     Scheduled Meds:  Chlorhexidine Gluconate Cloth  6 each Topical Daily   ondansetron (ZOFRAN) IV  4 mg Intravenous Q6H   Continuous Infusions:  ceFEPime (MAXIPIME) IV 200 mL/hr at 12/25/20 2202   metronidazole     pantoprazole 8 mg/hr (12/25/20 5427)    Procedures/Studies: CT ABDOMEN PELVIS W CONTRAST  Result Date: 12/24/2020 CLINICAL DATA:  Abdominal pain x1 week. EXAM: CT ABDOMEN AND PELVIS WITH CONTRAST TECHNIQUE: Multidetector CT imaging of the abdomen and pelvis was performed using the standard protocol following bolus administration of intravenous contrast. CONTRAST:  47mL OMNIPAQUE IOHEXOL 350 MG/ML SOLN COMPARISON:  August 16, 2020 FINDINGS: Lower chest: No acute abnormality. Hepatobiliary: No focal liver abnormality is seen. Mild central intrahepatic biliary dilatation is noted. The gallbladder is mildly distended without evidence of gallstones or gallbladder wall thickening. The common bile duct measures approximately 7 mm. Pancreas: Unremarkable. No pancreatic ductal dilatation or surrounding inflammatory changes.  Spleen: Normal in size without focal abnormality. Adrenals/Urinary Tract: Adrenal glands are unremarkable. Kidneys are normal, without renal calculi, focal lesion, or hydronephrosis. Bladder is unremarkable. Stomach/Bowel: Gastric wall thickening is again seen, most prominent within the region of the gastric antrum/pylorus. Adjacent inflammatory fat stranding and fluid are noted. A 7 mm focus of air attenuation is seen along the posterior wall of the gastric antrum (axial CT image 27, CT series 2). Appendix appears normal. No evidence of bowel dilatation. Noninflamed diverticula are seen throughout the sigmoid colon. Vascular/Lymphatic: Aortic atherosclerosis. No enlarged abdominal or pelvic lymph nodes. Reproductive: There is diffuse enlargement of the lower uterine segment (axial CT images 80 through 84, CT series 2). A 2.9 cm x 2.6 cm right adnexal cyst is seen. Other: No abdominal wall hernia or abnormality. No abdominopelvic ascites. Musculoskeletal: Stable grade 1 anterolisthesis of the L5 vertebral body is noted on S1. IMPRESSION: 1. Findings consistent with gastritis involving the gastric antrum. 2. 7 mm focus of air attenuation along the posterior wall of the gastric antrum which may represent a small gastric ulcer. A small focus of contained free air can not completely be excluded. 3. Diffuse enlargement of the lower uterine segment. While this may represent a fibroid, correlation with pelvic ultrasound is recommended. 4. 2.9 cm x 2.6 cm right adnexal cyst, likely ovarian in origin. 5. Stable grade 1 anterolisthesis of the L5 vertebral body on S1. 6. Aortic atherosclerosis. Aortic Atherosclerosis (ICD10-I70.0). Electronically Signed   By: Aram Candela M.D.   On: 12/24/2020 22:13    Catarina Hartshorn, DO  [facility]  If 7PM-7AM, please contact night-coverage [email or web address] Password [email or web address] 12/25/2020, 9:06 AM   LOS: 0 days

## 2020-12-25 NOTE — Consult Note (Signed)
[facility] SURGICAL CONSULTATION NOTE (initial) - cpt: 71245   HISTORY OF PRESENT ILLNESS (HPI):  45 y.o. female presented to Kristina Cardenas yesterday for evaluation of abdominal pain. Patient reports upper abdominal pain recurring, she reports likely etiology is running out of her PPI.  She has had prior gastric surgery operations.  She reports the epigastric pain is been continuing for about a week, central with radiation to the back.  She reports it is severe. she reports she has had associated nausea with emesis.  She denies vomiting blood.  She denies loose stools.  She admits to no utilization of NSAIDs, Goody powders or aspirin.  She denies fevers/chills, she denies chest pain dyspnea, or black tarry stools. Appreciate hospitalist service admission for initiation of IV antibiotics and Protonix drip.  Surgery is consulted by Cardenas physician, in this context for evaluation and management pain associated with recurrent gastric ulcer, possible near perforation or with a contained perforation.   PAST MEDICAL HISTORY (PMH):  Past Medical History:  Diagnosis Date   Bell's palsy    COVID    GERD (gastroesophageal reflux disease)    History of stomach ulcers    Stroke (HCC)      PAST SURGICAL HISTORY (PSH):  Past Surgical History:  Procedure Laterality Date   ABDOMINAL SURGERY       MEDICATIONS:  Prior to Admission medications   Medication Sig Start Date End Date Taking? Authorizing Provider  Acetaminophen (TYLENOL) 325 MG CAPS Take 3 tablets by mouth daily as needed.   Yes [provider]  pantoprazole (PROTONIX) 40 MG tablet Take 1 tablet (40 mg total) by mouth 2 (two) times daily. 10/29/20  Yes Sabas Sous, MD  HYDROcodone-acetaminophen (NORCO) 10-325 MG tablet Take 1 tablet by mouth every 8 (eight) hours as needed. Patient not taking: Reported on 12/24/2020 03/08/20   [provider]  oxyCODONE-acetaminophen (PERCOCET/ROXICET) 5-325 MG tablet Take 1 tablet  by mouth every 6 (six) hours as needed for severe pain. Patient not taking: Reported on 12/24/2020 08/16/20   Arthor Captain, PA-C  promethazine (PHENERGAN) 25 MG tablet Take 1 tablet (25 mg total) by mouth every 6 (six) hours as needed for nausea or vomiting. Patient not taking: Reported on 12/24/2020 08/16/20   Arthor Captain, PA-C  sucralfate (CARAFATE) 1 g tablet Take 1 tablet (1 g total) by mouth 4 (four) times daily -  with meals and at bedtime. Patient not taking: Reported on 12/24/2020 08/16/20   Arthor Captain, PA-C     ALLERGIES:  Allergies  Allergen Reactions   Sulfa Antibiotics    Toradol [Ketorolac Tromethamine]      SOCIAL HISTORY:  Social History   Socioeconomic History   Marital status: Divorced    Spouse name: Not on file   Number of children: Not on file   Years of education: Not on file   Highest education level: Not on file  Occupational History   Not on file  Tobacco Use   Smoking status: Every Day    Packs/day: 0.50    Types: Cigarettes   Smokeless tobacco: Never  Vaping Use   Vaping Use: Some days  Substance and Sexual Activity   Alcohol use: Not Currently   Drug use: Not Currently   Sexual activity: Not Currently  Other Topics Concern   Not on file  Social History Narrative   Not on file   Social Determinants of Health   Financial Resource Strain: Not on file  Food Insecurity: Not on  file  Transportation Needs: Not on file  Physical Activity: Not on file  Stress: Not on file  Social Connections: Not on file  Intimate Partner Violence: Not on file     FAMILY HISTORY:  History reviewed. No pertinent family history.    REVIEW OF SYSTEMS:  Constitutional: Negative for chills and fever.  HENT: Negative for ear pain and sore throat.   Eyes: Negative for pain and visual disturbance.  Respiratory: Negative for cough, chest tightness and shortness of breath.   Cardiovascular: Negative for chest pain and palpitations.  Gastrointestinal:  Positive for abdominal pain, nausea and vomiting.  Endocrine: Negative for polyphagia and polyuria.  Genitourinary: Negative for decreased urine volume, dysuria, enuresis Musculoskeletal: Negative for arthralgias and back pain.  Skin: Negative for color change and rash.  Allergic/Immunologic: Negative for immunocompromised state.  Neurological: Negative for tremors, syncope, speech difficulty Hematological: Does not bruise/bleed easily.  VITAL SIGNS:  Temp:  [97.8 F (36.6 C)-98.8 F (37.1 C)] 98.8 F (37.1 C) (08/27 0735) Pulse Rate:  [67-92] 69 (08/27 0735) Resp:  [14-22] 20 (08/27 0600) BP: (134-178)/(77-103) 153/84 (08/27 0600) SpO2:  [96 %-100 %] 97 % (08/27 0735) Weight:  [69.4 kg-70.3 kg] 69.4 kg (08/27 0245)     Height: 5\' 8"  (172.7 cm) Weight: 69.4 kg BMI (Calculated): 23.27   INTAKE/OUTPUT:  08/26 0701 - 08/27 0700 In: 1611.1 [I.V.:283.2; IV Piggyback:1327.9] Out: -   PHYSICAL EXAM:  Physical Exam Blood pressure (!) 153/84, pulse 69, temperature 98.8 F (37.1 C), temperature source Oral, resp. rate 20, height 5\' 8"  (1.727 m), weight 69.4 kg, last menstrual period 11/23/2020, SpO2 97 %. Last Weight  Most recent update: 12/25/2020  3:02 AM    Weight  69.4 kg (153 lb)             CONSTITUTIONAL: Well developed, and nourished, appropriately responsive and aware without distress.  I found her laying in a left lateral decubitus position in a somewhat fetal position. EYES: Sclera non-icteric.   EARS, NOSE, MOUTH AND THROAT:  Oral mucosa is pink and moist.    Hearing is intact to voice.  NECK: Trachea is midline, and there is no jugular venous distension.  LYMPH NODES:  Lymph nodes in the neck are not enlarged. RESPIRATORY:  Lungs are clear, and breath sounds are equal bilaterally. Normal respiratory effort without pathologic use of accessory muscles. CARDIOVASCULAR: Heart is regular in rate and rhythm. GI: The abdomen is scarred in the epigastrium, tender to deep  palpation, otherwise soft, nontender, and nondistended. There were no palpable masses. I did not appreciate hepatosplenomegaly. There were normal bowel sounds. MUSCULOSKELETAL:  Symmetrical muscle tone appreciated in all four extremities.    SKIN: Skin turgor is normal. No pathologic skin lesions appreciated.  NEUROLOGIC:  Motor and sensation appear grossly normal.  Cranial nerves are grossly without defect. PSYCH:  Alert and oriented to person, place and time. Affect is appropriate for situation.  Data Reviewed I have personally reviewed what is currently available of the patient's imaging, recent labs and medical records.    Labs:  CBC Latest Ref Rng & Units 12/25/2020 12/24/2020 10/28/2020  WBC 4.0 - 10.5 K/uL 6.7 7.9 8.5  Hemoglobin 12.0 - 15.0 g/dL 12/26/2020 15.8(H) 12.5  Hematocrit 36.0 - 46.0 % 38.5 47.6(H) 39.8  Platelets 150 - 400 K/uL 306 406(H) 323   CMP Latest Ref Rng & Units 12/25/2020 12/24/2020 10/28/2020  Glucose 70 - 99 mg/dL 76 90 12/26/2020)  BUN 6 - 20 mg/dL 13 15  16  Creatinine 0.44 - 1.00 mg/dL 3.32 9.51 8.84  Sodium 135 - 145 mmol/L 136 134(L) 138  Potassium 3.5 - 5.1 mmol/L 3.8 3.5 3.6  Chloride 98 - 111 mmol/L 105 98 106  CO2 22 - 32 mmol/L 24 26 23   Calcium 8.9 - 10.3 mg/dL ) 9.3 1.6(S)  Total Protein 6.5 - 8.1 g/dL 6.6 0.6(T) 7.5  Total Bilirubin 0.3 - 1.2 mg/dL 0.5 0.5 0.1(S)  Alkaline Phos 38 - 126 U/L 153(H) 204(H) 186(H)  AST 15 - 41 U/L 14(L) 19 31  ALT 0 - 44 U/L 9 13 18      Imaging studies:    Last 24 hrs: CT ABDOMEN PELVIS W CONTRAST  Result Date: 12/24/2020 CLINICAL DATA:  Abdominal pain x1 week. EXAM: CT ABDOMEN AND PELVIS WITH CONTRAST TECHNIQUE: Multidetector CT imaging of the abdomen and pelvis was performed using the standard protocol following bolus administration of intravenous contrast. CONTRAST:  48mL OMNIPAQUE IOHEXOL 350 MG/ML SOLN COMPARISON:  August 16, 2020 FINDINGS: Lower chest: No acute abnormality. Hepatobiliary: No focal liver  abnormality is seen. Mild central intrahepatic biliary dilatation is noted. The gallbladder is mildly distended without evidence of gallstones or gallbladder wall thickening. The common bile duct measures approximately 7 mm. Pancreas: Unremarkable. No pancreatic ductal dilatation or surrounding inflammatory changes. Spleen: Normal in size without focal abnormality. Adrenals/Urinary Tract: Adrenal glands are unremarkable. Kidneys are normal, without renal calculi, focal lesion, or hydronephrosis. Bladder is unremarkable. Stomach/Bowel: Gastric wall thickening is again seen, most prominent within the region of the gastric antrum/pylorus. Adjacent inflammatory fat stranding and fluid are noted. A 7 mm focus of air attenuation is seen along the posterior wall of the gastric antrum (axial CT image 27, CT series 2). Appendix appears normal. No evidence of bowel dilatation. Noninflamed diverticula are seen throughout the sigmoid colon. Vascular/Lymphatic: Aortic atherosclerosis. No enlarged abdominal or pelvic lymph nodes. Reproductive: There is diffuse enlargement of the lower uterine segment (axial CT images 80 through 84, CT series 2). A 2.9 cm x 2.6 cm right adnexal cyst is seen. Other: No abdominal wall hernia or abnormality. No abdominopelvic ascites. Musculoskeletal: Stable grade 1 anterolisthesis of the L5 vertebral body is noted on S1. IMPRESSION: 1. Findings consistent with gastritis involving the gastric antrum. 2. 7 mm focus of air attenuation along the posterior wall of the gastric antrum which may represent a small gastric ulcer. A small focus of contained free air can not completely be excluded. 3. Diffuse enlargement of the lower uterine segment. While this may represent a fibroid, correlation with pelvic ultrasound is recommended. 4. 2.9 cm x 2.6 cm right adnexal cyst, likely ovarian in origin. 5. Stable grade 1 anterolisthesis of the L5 vertebral body on S1. 6. Aortic atherosclerosis. Aortic  Atherosclerosis (ICD10-I70.0). Electronically Signed   By: 71m M.D.   On: 12/24/2020 22:13     Assessment/Plan:  45 y.o. 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, worsening of medication, complicated by pertinent comorbidities including :  Patient Active Problem List   Diagnosis Date Noted   Abdominal pain 12/25/2020   Thrombocytosis 12/25/2020   Elevated alkaline phosphatase level 12/25/2020   Tobacco abuse 12/25/2020   Nausea and vomiting    Gastric ulcer 04/03/2020   Gastric ulceration 04/03/2020   Acute hypokalemia 04/02/2020   Metabolic alkalosis 04/02/2020   Bell's palsy    Stroke [facility]    History of Upper GI bleed 10/01/2015   Gastroesophageal reflux disease 01/03/2002    -As there appears to  be no evidence of sepsis or free perforation, we will continue conservative management.  -IV antibiotics and PPI.  -Would continue n.p.o. status until evaluated, potentially by Gastrografin UGI contrast study on Monday.  - DVT prophylaxis  All of the above findings and recommendations were discussed with the patient and family(if present), and all of patient's were answered to their expressed satisfaction.  Thank you for the opportunity to participate in this patient's care.   -- Campbell Lernerenny Hamad Whyte, M.D., FACS 12/25/2020, 7:53 AM

## 2020-12-25 NOTE — Consult Note (Signed)
Referring Provider: No ref. provider found Primary Care Physician:  Patient, No Pcp Per (Inactive) Primary Gastroenterologist: Unknown   Reason for Consultation: Peptic ulcer disease with possible gastric perforation.  HPI: This is a 45 year old lady with multiple comorbidities including a history of complicated peptic ulcer disease (see below) NSAID addiction-ongoing (Goody powders ), polysubstance abuse, CVA admitted to the hospital with severe abdominal pain.  Seen in the ED yesterday.  On CT scan,  found to have an abnormal stomach including a thickening of the antrum and pylorus ; 77mm focus of air representing ulcer crater or possibly a contained perforation (incidentally, multiple CTs here over the past couple years have suggested peptic ulcer disease as well)  She has not had any evidence of GI bleeding.  She has been admitted to the ICU and placed n.p.o. and started on antibiotics.  Surgical consultation in progress.  She has a normal white count and hemoglobin this morning.  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  When she lived in [place], she presented to the [facility] ED with what sounds like GI bleeding and perforation.  She describes undergoing therapeutic endoscopy at that time and ultimately underwent a laparotomy for perforation (patient report-old records not readily available).  She describes being transferred to [facility] in [place].  She also states she was admitted at [facility] [employer] center.  She denies reflux symptoms, odynophagia, dysphagia, hematemesis melena or rectal bleeding.  Past Medical History:  Diagnosis Date   Bell's palsy    COVID    GERD (gastroesophageal reflux disease)    History of stomach ulcers    Stroke [facility])     Past Surgical History:  Procedure Laterality Date    ABDOMINAL SURGERY      Prior to Admission medications   Medication Sig Start Date End Date Taking? Authorizing Provider  Acetaminophen (TYLENOL) 325 MG CAPS Take 3 tablets by mouth daily as needed.   Yes [provider]  pantoprazole (PROTONIX) 40 MG tablet Take 1 tablet (40 mg total) by mouth 2 (two) times daily. 10/29/20  Yes Sabas Sous, MD  HYDROcodone-acetaminophen (NORCO) 10-325 MG tablet Take 1 tablet by mouth every 8 (eight) hours as needed. Patient not taking: Reported on 12/24/2020 03/08/20   [provider]  oxyCODONE-acetaminophen (PERCOCET/ROXICET) 5-325 MG tablet Take 1 tablet by mouth every 6 (six) hours as needed for severe pain. Patient not taking: Reported on 12/24/2020 08/16/20   Arthor Captain, PA-C  promethazine (PHENERGAN) 25 MG tablet Take 1 tablet (25 mg total) by mouth every 6 (six) hours as needed for nausea or vomiting. Patient not taking: Reported on 12/24/2020 08/16/20   Arthor Captain, PA-C  sucralfate (CARAFATE) 1 g tablet Take 1 tablet (1 g total) by mouth 4 (four) times daily -  with meals and at bedtime. Patient not taking: Reported on 12/24/2020 08/16/20   Arthor Captain, PA-C    Current Facility-Administered Medications  Medication Dose Route Frequency Provider Last Rate Last Admin   acetaminophen (TYLENOL) tablet 650 mg  650 mg Oral Q6H PRN Tat, Onalee Hua, MD       ceFEPIme (MAXIPIME) 2 g in sodium chloride 0.9 % 100 mL IVPB  2 g Intravenous Q8H Adefeso, Oladapo, DO 200 mL/hr at 12/25/20 1950 Infusion Verify at 12/25/20 9326   Chlorhexidine Gluconate Cloth 2 % PADS 6 each  6 each  Topical Daily Tat, Onalee Hua, MD       HYDROmorphone (DILAUDID) injection 0.5 mg  0.5 mg Intravenous Q2H PRN Tat, Onalee Hua, MD   0.5 mg at 12/25/20 1029   metroNIDAZOLE (FLAGYL) IVPB 500 mg  500 mg Intravenous Pablo Ledger, MD 100 mL/hr at 12/25/20 0920 500 mg at 12/25/20 0920   ondansetron (ZOFRAN) injection 4 mg  4 mg Intravenous Q6H PRN Adefeso, Oladapo, DO   4 mg at  12/25/20 0915   ondansetron (ZOFRAN) injection 4 mg  4 mg Intravenous Q6H Tat, Onalee Hua, MD       pantoprozole (PROTONIX) 80 mg /NS 100 mL infusion  8 mg/hr Intravenous Continuous Adefeso, Oladapo, DO 10 mL/hr at 12/25/20 1105 8 mg/hr at 12/25/20 1105    Allergies as of 12/24/2020 - Review Complete 12/24/2020  Allergen Reaction Noted   Sulfa antibiotics  04/02/2020   Toradol [ketorolac tromethamine]  04/02/2020    History reviewed. No pertinent family history.  Social History   Socioeconomic History   Marital status: Divorced    Spouse name: Not on file   Number of children: Not on file   Years of education: Not on file   Highest education level: Not on file  Occupational History   Not on file  Tobacco Use   Smoking status: Every Day    Packs/day: 0.50    Types: Cigarettes   Smokeless tobacco: Never  Vaping Use   Vaping Use: Some days  Substance and Sexual Activity   Alcohol use: Not Currently   Drug use: Not Currently   Sexual activity: Not Currently  Other Topics Concern   Not on file  Social History Narrative   Not on file   Social Determinants of Health   Financial Resource Strain: Not on file  Food Insecurity: Not on file  Transportation Needs: Not on file  Physical Activity: Not on file  Stress: Not on file  Social Connections: Not on file  Intimate Partner Violence: Not on file    Review of Systems:  As in history of present illness.  Physical Exam: Vital signs in last 24 hours: Temp:  [97.8 F (36.6 C)-98.8 F (37.1 C)] 98.8 F (37.1 C) (08/27 0735) Pulse Rate:  [67-92] 69 (08/27 0735) Resp:  [14-22] 20 (08/27 0600) BP: (134-178)/(77-103) 153/84 (08/27 0600) SpO2:  [96 %-100 %] 97 % (08/27 0735) Weight:  [69.4 kg-70.3 kg] 69.4 kg (08/27 0245)   Seen in ICU room 4 General:   Found in the fetal position.  Appears sick.  Awake and conversant. Eyes:  Sclera clear, no icterus.   Conjunctiva pink. Lungs:  Clear throughout to auscultation.   No  wheezes, crackles, or rhonchi. No acute distress. Heart:  Regular rate and rhythm; no murmurs, clicks, rubs,  or gallops. Abdomen: Nondistended.  Well-healed vertical laparotomy scar present.  She has bowel sounds.  She has marked voluntary guarding periumbilical/epigastric tenderness.  Abdomen is soft.  She does not have rebound by my examination.   Intake/Output from previous day: 08/26 0701 - 08/27 0700 In: 1611.1 [I.V.:283.2; IV Piggyback:1327.9] Out: -  Intake/Output this shift: No intake/output data recorded.  Lab Results: Recent Labs    12/24/20 2006 12/25/20 0325  WBC 7.9 6.7  HGB 15.8* 12.4  HCT 47.6* 38.5  PLT 406* 306   BMET Recent Labs    12/24/20 2006 12/25/20 0325  NA 134* 136  K 3.5 3.8  CL 98 105  CO2 26 24  GLUCOSE 90 76  BUN 15 13  CREATININE 0.61 0.61  CALCIUM 9.3 8.6*   LFT Recent Labs    12/25/20 0325  PROT 6.6  ALBUMIN 3.0*  AST 14*  ALT 9  ALKPHOS 153*  BILITOT 0.5   PT/INR Recent Labs    12/25/20 0325  LABPROT 13.8  INR 1.1   Hepatitis Panel No results for input(s): HEPBSAG, HCVAB, HEPAIGM, HEPBIGM in the last 72 hours. C-Diff No results for input(s): CDIFFTOX in the last 72 hours.  Studies/Results: CT ABDOMEN PELVIS W CONTRAST  Result Date: 12/24/2020 CLINICAL DATA:  Abdominal pain x1 week. EXAM: CT ABDOMEN AND PELVIS WITH CONTRAST TECHNIQUE: Multidetector CT imaging of the abdomen and pelvis was performed using the standard protocol following bolus administration of intravenous contrast. CONTRAST:  55mL OMNIPAQUE IOHEXOL 350 MG/ML SOLN COMPARISON:  August 16, 2020 FINDINGS: Lower chest: No acute abnormality. Hepatobiliary: No focal liver abnormality is seen. Mild central intrahepatic biliary dilatation is noted. The gallbladder is mildly distended without evidence of gallstones or gallbladder wall thickening. The common bile duct measures approximately 7 mm. Pancreas: Unremarkable. No pancreatic ductal dilatation or surrounding  inflammatory changes. Spleen: Normal in size without focal abnormality. Adrenals/Urinary Tract: Adrenal glands are unremarkable. Kidneys are normal, without renal calculi, focal lesion, or hydronephrosis. Bladder is unremarkable. Stomach/Bowel: Gastric wall thickening is again seen, most prominent within the region of the gastric antrum/pylorus. Adjacent inflammatory fat stranding and fluid are noted. A 7 mm focus of air attenuation is seen along the posterior wall of the gastric antrum (axial CT image 27, CT series 2). Appendix appears normal. No evidence of bowel dilatation. Noninflamed diverticula are seen throughout the sigmoid colon. Vascular/Lymphatic: Aortic atherosclerosis. No enlarged abdominal or pelvic lymph nodes. Reproductive: There is diffuse enlargement of the lower uterine segment (axial CT images 80 through 84, CT series 2). A 2.9 cm x 2.6 cm right adnexal cyst is seen. Other: No abdominal wall hernia or abnormality. No abdominopelvic ascites. Musculoskeletal: Stable grade 1 anterolisthesis of the L5 vertebral body is noted on S1. IMPRESSION: 1. Findings consistent with gastritis involving the gastric antrum. 2. 7 mm focus of air attenuation along the posterior wall of the gastric antrum which may represent a small gastric ulcer. A small focus of contained free air can not completely be excluded. 3. Diffuse enlargement of the lower uterine segment. While this may represent a fibroid, correlation with pelvic ultrasound is recommended. 4. 2.9 cm x 2.6 cm right adnexal cyst, likely ovarian in origin. 5. Stable grade 1 anterolisthesis of the L5 vertebral body on S1. 6. Aortic atherosclerosis. Aortic Atherosclerosis (ICD10-I70.0). Electronically Signed   By: Aram Candela M.D.   On: 12/24/2020 22:13    Impression: 45 year old lady with longstanding NSAID (Goody powder ) abuse, history of complicated peptic ulcer disease (prior GI bleeding/perforation, admitted to the hospital with severe abdominal  pain.  Cross-sectional imaging findings suspicious for active ulcer disease with possible contained perforation. This patient appears ill.  She may or may not have a free perforation.. Her abdomen is not rigid she does not have rebound by my exam at this time.  Recommendations:  As you are doing, keep her strictly n.p.o.  Agree with antibiotics.  Agree with PPI infusion.  Agree with surgery consultation.  May not need to go to the OR depending on her clinical course.  No place for upper endoscopy in this acute setting.  However, would likely be offered at some point in the future.  Ongoing NSAID abuse obviously deleterious to her health.  She has  been urged to stop this behavior.  I have discussed the case with Dr. Arbutus Leasat.         Notice:  This dictation was prepared with Dragon dictation along with smaller phrase technology. Any transcriptional errors that result from this process are unintentional and may not be corrected upon review.

## 2020-12-26 LAB — CBC
HCT: 36.6 % (ref 36.0–46.0)
Hemoglobin: 11.9 g/dL — ABNORMAL LOW (ref 12.0–15.0)
MCH: 31.2 pg (ref 26.0–34.0)
MCHC: 32.5 g/dL (ref 30.0–36.0)
MCV: 96.1 fL (ref 80.0–100.0)
Platelets: 280 10*3/uL (ref 150–400)
RBC: 3.81 MIL/uL — ABNORMAL LOW (ref 3.87–5.11)
RDW: 14.4 % (ref 11.5–15.5)
WBC: 6.9 10*3/uL (ref 4.0–10.5)
nRBC: 0 % (ref 0.0–0.2)

## 2020-12-26 LAB — COMPREHENSIVE METABOLIC PANEL
ALT: 10 U/L (ref 0–44)
AST: 12 U/L — ABNORMAL LOW (ref 15–41)
Albumin: 2.9 g/dL — ABNORMAL LOW (ref 3.5–5.0)
Alkaline Phosphatase: 135 U/L — ABNORMAL HIGH (ref 38–126)
Anion gap: 7 (ref 5–15)
BUN: 13 mg/dL (ref 6–20)
CO2: 20 mmol/L — ABNORMAL LOW (ref 22–32)
Calcium: 8.3 mg/dL — ABNORMAL LOW (ref 8.9–10.3)
Chloride: 105 mmol/L (ref 98–111)
Creatinine, Ser: 0.54 mg/dL (ref 0.44–1.00)
GFR, Estimated: >60 mL/min (ref >60)
Glucose, Bld: 56 mg/dL — ABNORMAL LOW (ref 70–99)
Potassium: 3.7 mmol/L (ref 3.5–5.1)
Sodium: 132 mmol/L — ABNORMAL LOW (ref 135–145)
Total Bilirubin: 0.8 mg/dL (ref 0.3–1.2)
Total Protein: 6.4 g/dL — ABNORMAL LOW (ref 6.5–8.1)

## 2020-12-26 LAB — SARS CORONAVIRUS 2 (TAT 6-24 HRS): SARS Coronavirus 2: NEGATIVE

## 2020-12-26 LAB — MAGNESIUM: Magnesium: 1.7 mg/dL (ref 1.7–2.4)

## 2020-12-26 MED ORDER — PANTOPRAZOLE SODIUM 40 MG PO TBEC: 40.0000 mg | DELAYED_RELEASE_TABLET | Freq: Two times a day (BID) | ORAL | 1 refills | Status: DC

## 2020-12-26 NOTE — Progress Notes (Signed)
Pt left ama 

## 2020-12-26 NOTE — Plan of Care (Signed)
Pt received in transfer from ICU this evening.  Pt still requiring Dilaudid q2h PRN and Zofran.   Hilton Sinclair BSN RN CMSRN   Problem: Pain Managment: Goal: General experience of comfort will improve Outcome: Not Progressing

## 2020-12-26 NOTE — Progress Notes (Signed)
Patient left AMA.  Patient signed paperwork and was informed of all potential consequences up to and including death. Patient states understanding that leaving AMA means that anything that happens to her as a result is her responsibility.

## 2020-12-26 NOTE — Discharge Summary (Signed)
Physician Discharge Summary  Kristina SchmidtKimberley Cardenas EPP:[id number] DOB: 01/06/1976 DOA: 12/24/2020  PCP: Patient, No Pcp Per (Inactive)  Admit date: 12/24/2020 Discharge date: 12/26/2020  Admitted From: HOME Disposition:  AGAINST MEDICAL ADVICE    Discharge Condition: AGAINST MEDICAL ADVICE    Brief/Interim Summary: 45 year old female with a history of stroke, peptic ulcer disease with gastric perforation, hypertension, hyperlipidemia, upper GI bleed, and cocaine abuse presenting with 1 week history of abdominal pain primarily located in the epigastric and left upper quadrant area.  She has had intermittent nausea and vomiting for the last 3 to 4 days without any hematemesis.  She continues to smoke 1/2 pack/day.  She has been taking Goody's powder regularly for her pain.  She denies any alcohol or illegal drugs.  She denies any fever, chills, chest pain, shortness of breath, hemoptysis, diarrhea, hematuria, hematochezia, melena. In the emergency department, the patient was afebrile and hemodynamically stable with oxygen saturation 98% room air.  CT of the abdomen and pelvis showed mild central intrahepatic biliary ductal dilatation.  There was gastric wall thickening in the region of the antrum and pylorus.  There was a 7 mm focus of air in the posterior wall of the gastric antrum, cannot rule out free air. The patient was placed on a protonix drip and empiric cefepime and flagyl.  General surgery and GI were consulted.  She was kept NPO and started on IVF with judicious opioids for pain.  In the morning of 12/26/20, she stated she wanted to leave AMA.  I spoke to the patient and explained the risk involved including but not limited to stomach perforation, sepsis, death.  She expressed understanding and wished to leave AMA.  In speaking with Kristina SchmidtKimberley Laday, Kristina Cardenas has demonstrated the ability to understand his medical condition(s) which include gastric ulcer with possible perforation.   Kristina SchmidtKimberley Cardenas has demonstrated the ability to appreciate how treatment for gastric ulcer with possible perforation will be beneficial.   Kristina SchmidtKimberley Cardenas has also demonstrated the ability to understand and appreciate how refusal of treatment for gastric ulcer with possible perforation could result in harm, repeat hospitalization, and possibly death.  Kristina SchmidtKimberley Cardenas demonstrates the ability to reason through the risks and benefits of the proposed treatment.  Finally, Kristina SchmidtKimberley Cardenas is able to clearly communicate his/her choice.   Discharge Diagnoses:   Intractable abdominal pain, vomiting -12/24/2020 CT abdomen as discussed above -Concerned about gastric perforation -General surgery consult appreciated-->continue nonoperative managment -GI consult appreciated>>no indication for endoscopy presently -Continue Protonix drip -Remain n.p.o. -Continue IV fluids -Judicious opioids -12/26/20--patient leaving AMA -Rx sent for protonix bid   Polysubstance abuse -Patient currently refuses to give urine sample b/c she "does not want anything reported" -Previous urine sample shows cocaine 04/03/20 -Tobacco cessation discussed -12/25/20 UDS +amphetamines, +cocaine   Essential hypertension -Patient previously took lisinopril/HCTZ -Has not been on any antihypertensive medications for least 5 years -BP stable presently -monitor for now   Hyperlipidemia -Has not taking any medications for approximately 5 years -Previously on Zocor   History of Stroke -not on any antiplatelets--hold for now in setting of possible gastric perforation    Discharge Instructions   Allergies as of 12/26/2020       Reactions   Sulfa Antibiotics    Toradol [ketorolac Tromethamine]         Medication List     STOP taking these medications    HYDROcodone-acetaminophen 10-325 MG tablet Commonly known as: NORCO   oxyCODONE-acetaminophen 5-325 MG tablet Commonly known as: PERCOCET/ROXICET  promethazine  25 MG tablet Commonly known as: PHENERGAN   sucralfate 1 g tablet Commonly known as: Carafate   Tylenol 325 MG Caps Generic drug: Acetaminophen       TAKE these medications    pantoprazole 40 MG tablet Commonly known as: PROTONIX Take 1 tablet (40 mg total) by mouth 2 (two) times daily.        Allergies  Allergen Reactions   Sulfa Antibiotics    Toradol [Ketorolac Tromethamine]     Consultations: GI General surgery   Procedures/Studies: CT ABDOMEN PELVIS W CONTRAST  Result Date: 12/24/2020 CLINICAL DATA:  Abdominal pain x1 week. EXAM: CT ABDOMEN AND PELVIS WITH CONTRAST TECHNIQUE: Multidetector CT imaging of the abdomen and pelvis was performed using the standard protocol following bolus administration of intravenous contrast. CONTRAST:  67mL OMNIPAQUE IOHEXOL 350 MG/ML SOLN COMPARISON:  August 16, 2020 FINDINGS: Lower chest: No acute abnormality. Hepatobiliary: No focal liver abnormality is seen. Mild central intrahepatic biliary dilatation is noted. The gallbladder is mildly distended without evidence of gallstones or gallbladder wall thickening. The common bile duct measures approximately 7 mm. Pancreas: Unremarkable. No pancreatic ductal dilatation or surrounding inflammatory changes. Spleen: Normal in size without focal abnormality. Adrenals/Urinary Tract: Adrenal glands are unremarkable. Kidneys are normal, without renal calculi, focal lesion, or hydronephrosis. Bladder is unremarkable. Stomach/Bowel: Gastric wall thickening is again seen, most prominent within the region of the gastric antrum/pylorus. Adjacent inflammatory fat stranding and fluid are noted. A 7 mm focus of air attenuation is seen along the posterior wall of the gastric antrum (axial CT image 27, CT series 2). Appendix appears normal. No evidence of bowel dilatation. Noninflamed diverticula are seen throughout the sigmoid colon. Vascular/Lymphatic: Aortic atherosclerosis. No enlarged abdominal or pelvic lymph  nodes. Reproductive: There is diffuse enlargement of the lower uterine segment (axial CT images 80 through 84, CT series 2). A 2.9 cm x 2.6 cm right adnexal cyst is seen. Other: No abdominal wall hernia or abnormality. No abdominopelvic ascites. Musculoskeletal: Stable grade 1 anterolisthesis of the L5 vertebral body is noted on S1. IMPRESSION: 1. Findings consistent with gastritis involving the gastric antrum. 2. 7 mm focus of air attenuation along the posterior wall of the gastric antrum which may represent a small gastric ulcer. A small focus of contained free air can not completely be excluded. 3. Diffuse enlargement of the lower uterine segment. While this may represent a fibroid, correlation with pelvic ultrasound is recommended. 4. 2.9 cm x 2.6 cm right adnexal cyst, likely ovarian in origin. 5. Stable grade 1 anterolisthesis of the L5 vertebral body on S1. 6. Aortic atherosclerosis. Aortic Atherosclerosis (ICD10-I70.0). Electronically Signed   By: Aram Candela M.D.   On: 12/24/2020 22:13        Discharge Exam: Vitals:   12/26/20 0324 12/26/20 0712  BP: (!) 143/85 121/84  Pulse: 65 67  Resp: 18 18  Temp: 98.2 F (36.8 C) 98.7 F (37.1 C)  SpO2: 99% 98%   Vitals:   12/25/20 2317 12/25/20 2343 12/26/20 0324 12/26/20 0712  BP: 107/81 129/85 (!) 143/85 121/84  Pulse: 65 69 65 67  Resp: 18 18 18 18   Temp: 98 F (36.7 C) 98.2 F (36.8 C) 98.2 F (36.8 C) 98.7 F (37.1 C)  TempSrc: Oral Oral Oral Oral  SpO2: 99% 99% 99% 98%  Weight:      Height:        General: Pt is alert, awake, not in acute distress Cardiovascular: RRR, S1/S2 +, no rubs, no  gallops Respiratory: CTA bilaterally, no wheezing, no rhonchi Abdominal: Soft, upper abd pain, ND, bowel sounds + Extremities: no edema, no cyanosis   The results of significant diagnostics from this hospitalization (including imaging, microbiology, ancillary and laboratory) are listed below for reference.    Significant  Diagnostic Studies: CT ABDOMEN PELVIS W CONTRAST  Result Date: 12/24/2020 CLINICAL DATA:  Abdominal pain x1 week. EXAM: CT ABDOMEN AND PELVIS WITH CONTRAST TECHNIQUE: Multidetector CT imaging of the abdomen and pelvis was performed using the standard protocol following bolus administration of intravenous contrast. CONTRAST:  75mL OMNIPAQUE IOHEXOL 350 MG/ML SOLN COMPARISON:  August 16, 2020 FINDINGS: Lower chest: No acute abnormality. Hepatobiliary: No focal liver abnormality is seen. Mild central intrahepatic biliary dilatation is noted. The gallbladder is mildly distended without evidence of gallstones or gallbladder wall thickening. The common bile duct measures approximately 7 mm. Pancreas: Unremarkable. No pancreatic ductal dilatation or surrounding inflammatory changes. Spleen: Normal in size without focal abnormality. Adrenals/Urinary Tract: Adrenal glands are unremarkable. Kidneys are normal, without renal calculi, focal lesion, or hydronephrosis. Bladder is unremarkable. Stomach/Bowel: Gastric wall thickening is again seen, most prominent within the region of the gastric antrum/pylorus. Adjacent inflammatory fat stranding and fluid are noted. A 7 mm focus of air attenuation is seen along the posterior wall of the gastric antrum (axial CT image 27, CT series 2). Appendix appears normal. No evidence of bowel dilatation. Noninflamed diverticula are seen throughout the sigmoid colon. Vascular/Lymphatic: Aortic atherosclerosis. No enlarged abdominal or pelvic lymph nodes. Reproductive: There is diffuse enlargement of the lower uterine segment (axial CT images 80 through 84, CT series 2). A 2.9 cm x 2.6 cm right adnexal cyst is seen. Other: No abdominal wall hernia or abnormality. No abdominopelvic ascites. Musculoskeletal: Stable grade 1 anterolisthesis of the L5 vertebral body is noted on S1. IMPRESSION: 1. Findings consistent with gastritis involving the gastric antrum. 2. 7 mm focus of air attenuation along  the posterior wall of the gastric antrum which may represent a small gastric ulcer. A small focus of contained free air can not completely be excluded. 3. Diffuse enlargement of the lower uterine segment. While this may represent a fibroid, correlation with pelvic ultrasound is recommended. 4. 2.9 cm x 2.6 cm right adnexal cyst, likely ovarian in origin. 5. Stable grade 1 anterolisthesis of the L5 vertebral body on S1. 6. Aortic atherosclerosis. Aortic Atherosclerosis (ICD10-I70.0). Electronically Signed   By: Aram Candela M.D.   On: 12/24/2020 22:13    Microbiology: Recent Results (from the past 240 hour(s))  SARS CORONAVIRUS 2 ([place] 6-24 HRS) Nasopharyngeal Nasopharyngeal Swab     Status: None   Collection Time: 12/25/20  1:01 AM   Specimen: Nasopharyngeal Swab  Result Value Ref Range Status   SARS Coronavirus 2 NEGATIVE NEGATIVE Final    Comment: (NOTE) SARS-CoV-2 target nucleic acids are NOT DETECTED.  The SARS-CoV-2 RNA is generally detectable in upper and lower respiratory specimens during the acute phase of infection. Negative results do not preclude SARS-CoV-2 infection, do not rule out co-infections with other pathogens, and should not be used as the sole basis for treatment or other patient management decisions. Negative results must be combined with clinical observations, patient history, and epidemiological information. The expected result is Negative.  Fact Sheet for Patients: HairSlick.no  Fact Sheet for Healthcare Providers: quierodirigir.com  This test is not yet approved or cleared by the [place] FDA and  has been authorized for detection and/or diagnosis of SARS-CoV-2 by FDA under an Emergency Use  Authorization (EUA). This EUA will remain  in effect (meaning this test can be used) for the duration of the COVID-19 declaration under Se ction 564(b)(1) of the Act, 21 U.S.C. section 360bbb-3(b)(1), unless the  authorization is terminated or revoked sooner.  Performed at [facility], [address], [place], [place] [address]   MRSA Next Gen by PCR, Nasal     Status: None   Collection Time: 12/25/20  2:35 AM   Specimen: Nasal Mucosa; Nasal Swab  Result Value Ref Range Status   MRSA by PCR Next Gen NOT DETECTED NOT DETECTED Final    Comment: (NOTE) The GeneXpert MRSA Assay (FDA approved for NASAL specimens only), is one component of a comprehensive MRSA colonization surveillance program. It is not intended to diagnose MRSA infection nor to guide or monitor treatment for MRSA infections. Test performance is not FDA approved in patients less than 27 years old. Performed at [facility], [address], [place], [place] [address]   Culture, blood (Routine X 2) w Reflex to ID Panel     Status: None (Preliminary result)   Collection Time: 12/25/20  3:25 AM   Specimen: Left Antecubital; Blood  Result Value Ref Range Status   Specimen Description LEFT ANTECUBITAL  Final   Special Requests   Final    BOTTLES DRAWN AEROBIC AND ANAEROBIC Blood Culture adequate volume   Culture   Final    NO GROWTH 1 DAY Performed at [facility], [address], [place], [place] [address]    Report Status PENDING  Incomplete  Culture, blood (Routine X 2) w Reflex to ID Panel     Status: None (Preliminary result)   Collection Time: 12/25/20  3:31 AM   Specimen: BLOOD LEFT HAND  Result Value Ref Range Status   Specimen Description BLOOD LEFT HAND  Final   Special Requests   Final    BOTTLES DRAWN AEROBIC AND ANAEROBIC Blood Culture adequate volume   Culture   Final    NO GROWTH 1 DAY Performed at [facility], [address], [place], [place] [address]    Report Status PENDING  Incomplete     Labs: Basic Metabolic Panel: Recent Labs  Lab 12/24/20 2006 12/25/20 0325 12/26/20 0714  NA 134* 136 132*  K 3.5 3.8 3.7  CL 98 105 105  CO2 26 24 20*  GLUCOSE 90 76 56*  BUN 15 13 13   CREATININE 0.61  0.61 0.54  CALCIUM 9.3 8.6* 8.3*  MG  --  1.8 1.7  PHOS  --  3.9  --    Liver Function Tests: Recent Labs  Lab 12/24/20 2006 12/25/20 0325 12/26/20 0714  AST 19 14* 12*  ALT 13 9 10   ALKPHOS 204* 153* 135*  BILITOT 0.5 0.5 0.8  PROT 8.4* 6.6 6.4*  ALBUMIN 3.9 3.0* 2.9*   Recent Labs  Lab 12/24/20 2006  LIPASE 21   No results for input(s): AMMONIA in the last 168 hours. CBC: Recent Labs  Lab 12/24/20 2006 12/25/20 0325 12/26/20 0714  WBC 7.9 6.7 6.9  NEUTROABS 5.7  --   --   HGB 15.8* 12.4 11.9*  HCT 47.6* 38.5 36.6  MCV 93.0 95.8 96.1  PLT 406* 306 280   Cardiac Enzymes: No results for input(s): CKTOTAL, CKMB, CKMBINDEX, TROPONINI in the last 168 hours. BNP: Invalid input(s): POCBNP CBG: No results for input(s): GLUCAP in the last 168 hours.  Time coordinating discharge:  36 minutes  Signed:  12/27/20, DO  [facility] Pager: [phone] 12/26/2020, 10:32 AM

## 2020-12-27 LAB — URINE CULTURE: Culture: NO GROWTH

## 2020-12-30 LAB — CULTURE, BLOOD (ROUTINE X 2)
Culture: NO GROWTH
Culture: NO GROWTH
Special Requests: ADEQUATE
Special Requests: ADEQUATE

## 2021-02-01 ENCOUNTER — Encounter (HOSPITAL_COMMUNITY): Payer: Self-pay | Admitting: Radiology

## 2021-04-03 ENCOUNTER — Encounter (HOSPITAL_COMMUNITY): Payer: Self-pay | Admitting: Emergency Medicine

## 2021-04-03 ENCOUNTER — Other Ambulatory Visit: Payer: Self-pay

## 2021-04-03 ENCOUNTER — Emergency Department (HOSPITAL_COMMUNITY)
Admission: EM | Admit: 2021-04-03 | Discharge: 2021-04-03 | Disposition: A | Discharge status: Home / Self Care | Payer: Medicaid Other | Attending: Emergency Medicine | Admitting: Emergency Medicine

## 2021-04-03 DIAGNOSIS — Z760 Encounter for issue of repeat prescription: Secondary | ICD-10-CM | POA: Diagnosis not present

## 2021-04-03 DIAGNOSIS — F17210 Nicotine dependence, cigarettes, uncomplicated: Secondary | ICD-10-CM | POA: Insufficient documentation

## 2021-04-03 DIAGNOSIS — Z8616 Personal history of COVID-19: Secondary | ICD-10-CM | POA: Diagnosis not present

## 2021-04-03 MED ORDER — PANTOPRAZOLE SODIUM 40 MG PO TBEC
40.0000 mg | DELAYED_RELEASE_TABLET | Freq: Once | ORAL | Status: AC
  Administered 2021-04-03: 17:00:00 40 mg via ORAL
  Filled 2021-04-03: qty 1

## 2021-04-03 MED ORDER — PANTOPRAZOLE SODIUM 40 MG PO TBEC: 40.0000 mg | DELAYED_RELEASE_TABLET | Freq: Two times a day (BID) | ORAL | 1 refills | Status: DC

## 2021-04-03 NOTE — ED Triage Notes (Signed)
Takes protonix 40mg  BID for h/o bleeding ulcer with major abd history in past. Does not have a PCP to do this. Mild abd burning epigastric, no blood in stool, no emesis.

## 2021-04-03 NOTE — ED Provider Notes (Signed)
[facility] EMERGENCY DEPARTMENT Provider Note   CSN: [id number] Arrival date & time: 04/03/21  1514     History Chief Complaint  Patient presents with   Medication Refill    Kristina Cardenas is a 45 y.o. female with history of GERD and stomach ulcers.  Presents emergency department with a chief complaint of medication refill.  Patient reports that she ran out of her Protonix medication.  States that she does not have a PCP to refill this medication.  Patient has had some acid reflux when eating meals over the last few days since being out of her medication.  Endorses some associated nausea.  Denies any abdominal pain, vomiting, hematemesis, coffee-ground emesis, constipation, diarrhea, blood in stool, melena.   Medication Refill     Past Medical History:  Diagnosis Date   Bell's palsy    COVID    GERD (gastroesophageal reflux disease)    History of stomach ulcers    Stroke [facility])     Patient Active Problem List   Diagnosis Date Noted   Abdominal pain 12/25/2020   Thrombocytosis 12/25/2020   Elevated alkaline phosphatase level 12/25/2020   Tobacco abuse 12/25/2020   Nausea and vomiting    Gastric ulcer 04/03/2020   Gastric ulceration 04/03/2020   Acute hypokalemia 04/02/2020   Metabolic alkalosis 04/02/2020   Bell's palsy    Stroke [facility])    History of Upper GI bleed 10/01/2015   Gastroesophageal reflux disease 01/03/2002    Past Surgical History:  Procedure Laterality Date   ABDOMINAL SURGERY       OB History   No obstetric history on file.     No family history on file.  Social History   Tobacco Use   Smoking status: Every Day    Packs/day: 0.50    Types: Cigarettes   Smokeless tobacco: Never  Vaping Use   Vaping Use: Some days  Substance Use Topics   Alcohol use: Not Currently   Drug use: Not Currently    Home Medications Prior to Admission medications   Medication Sig Start Date End Date Taking? Authorizing Provider  pantoprazole  (PROTONIX) 40 MG tablet Take 1 tablet (40 mg total) by mouth 2 (two) times daily. 12/26/20   Catarina Hartshorn, MD    Allergies    Sulfa antibiotics and Toradol [ketorolac tromethamine]  Review of Systems   Review of Systems  Gastrointestinal:  Positive for nausea. Negative for abdominal distention, abdominal pain, anal bleeding, blood in stool, constipation, diarrhea, rectal pain and vomiting.  Genitourinary:  Negative for dysuria, hematuria, urgency, vaginal bleeding, vaginal discharge and vaginal pain.  Neurological:  Negative for syncope and light-headedness.   Physical Exam Updated Vital Signs BP 133/86 (BP Location: Right Arm)   Pulse 64   Temp 97.6 F (36.4 C) (Oral)   Resp 18   SpO2 99%   Physical Exam Vitals and nursing note reviewed.  Constitutional:      General: She is not in acute distress.    Appearance: She is not ill-appearing, toxic-appearing or diaphoretic.  HENT:     Head: Normocephalic.  Eyes:     General: No scleral icterus.       Right eye: No discharge.        Left eye: No discharge.  Cardiovascular:     Rate and Rhythm: Normal rate.  Pulmonary:     Effort: Pulmonary effort is normal.  Abdominal:     General: Abdomen is flat. There is no distension. There are no signs  of injury.     Palpations: Abdomen is soft. There is no mass or pulsatile mass.     Tenderness: There is no abdominal tenderness. There is no guarding or rebound.  Skin:    General: Skin is warm and dry.  Neurological:     General: No focal deficit present.     Mental Status: She is alert.     GCS: GCS eye subscore is 4. GCS verbal subscore is 5. GCS motor subscore is 6.  Psychiatric:        Behavior: Behavior is cooperative.    ED Results / Procedures / Treatments   Labs (all labs ordered are listed, but only abnormal results are displayed) Labs Reviewed - No data to display  EKG None  Radiology No results found.  Procedures Procedures   Medications Ordered in  ED Medications - No data to display  ED Course  I have reviewed the triage vital signs and the nursing notes.  Pertinent labs & imaging results that were available during my care of the patient were reviewed by me and considered in my medical decision making (see chart for details).    MDM Rules/Calculators/A&P                           Alert 45 year old female no acute distress, nontoxic-appearing.  Presents to ED with chief complaint of medication refill.  Patient is requesting refill of Protonix medication.  Per chart review patient has history of GERD bleeding ulcers.  Patient states that she has not been able to follow-up with gastroenterology or PCP in the outpatient setting.    Abdomen soft, nondistended, nontender.  Patient denies any vomiting, hematemesis, coffee-ground emesis, melena, blood in stool, abdominal pain.  We will give patient dose of Protonix here in the emergency department.  Plan to discharge patient with 30-day course of the same medication.  Patient given information to [facility] as well as gastroenterology.  Final Clinical Impression(s) / ED Diagnoses Final diagnoses:  None    Rx / DC Orders ED Discharge Orders     None        Loni Beckwith, PA-C 04/03/21 1657    Milton Ferguson, MD 04/05/21 [phone]

## 2021-04-03 NOTE — Discharge Instructions (Addendum)
I have given you a 30-day refill of your medication.  Please contact the gastroenterologist to schedule a follow-up appointment.  Please contact [facility] to schedule a follow-up for a primary care doctor.  Get help right away if: Your pain does not go away as soon as your doctor says it should. You cannot stop vomiting. Your pain is only in areas of your belly, such as the right side or the left lower part of the belly. You have bloody or black poop, or poop that looks like tar. You have very bad pain, cramping, or bloating in your belly. You have signs of not having enough fluid or water in your body (dehydration), such as: Dark pee, very little pee, or no pee. Cracked lips. Dry mouth. Sunken eyes. Sleepiness. Weakness. You have trouble breathing or chest pain.

## 2021-04-03 NOTE — ED Notes (Signed)
Pt A&OX4 ambulatory at d/c with independent steady gait, NAD. Pt verbalized understanding of d/c instructions, prescription and follow up care. 

## 2021-04-29 ENCOUNTER — Ambulatory Visit: Payer: Self-pay

## 2021-04-29 NOTE — Telephone Encounter (Signed)
Summary: incidental findings Letter / No PCP   Patient received an incidental finding letter and does not have a PCP. Patient would like to contact [facility] Financial Assistance program at [phone], prior to schedule a NPA.     No answer on phone.

## 2021-04-29 NOTE — Telephone Encounter (Signed)
Pt received an incidental finding letter and was concerned and wanted to if she needed to f/up on it. Pt has no PCP and advised pt she can take letter to ED and see if they can give her any advise or she can contact a PCP in the [place] area and see if they can assist her. Pt states she will f/up with the ED at some point.   Reason for Disposition  Health Information question, no triage required and triager able to answer question  Answer Assessment - Initial Assessment Questions 1. REASON FOR CALL or QUESTION: "What is your reason for calling today?" or "How can I best help you?" or "What question do you have that I can help answer?"     Incidental finding letter  Protocols used: Information Only Call - No Triage-A-AH

## 2021-04-29 NOTE — Telephone Encounter (Signed)
Patient called, no answer, voicemail is full.   Summary: incidental findings Letter / No PCP   Patient received an incidental finding letter and does not have a PCP. Patient would like to contact [facility] Financial Assistance program at [phone], prior to schedule a NPA.

## 2021-06-25 ENCOUNTER — Encounter (HOSPITAL_COMMUNITY): Payer: Self-pay

## 2021-06-25 ENCOUNTER — Emergency Department (HOSPITAL_COMMUNITY): Payer: Medicaid Other

## 2021-06-25 ENCOUNTER — Inpatient Hospital Stay (HOSPITAL_COMMUNITY)
Admission: EM | Admit: 2021-06-25 | Discharge: 2021-06-29 | DRG: 640 | Disposition: A | Discharge status: Home / Self Care | Payer: Medicaid Other | Attending: Internal Medicine | Admitting: Internal Medicine

## 2021-06-25 ENCOUNTER — Other Ambulatory Visit: Payer: Self-pay

## 2021-06-25 DIAGNOSIS — E876 Hypokalemia: Secondary | ICD-10-CM | POA: Diagnosis present

## 2021-06-25 DIAGNOSIS — Q438 Other specified congenital malformations of intestine: Secondary | ICD-10-CM | POA: Diagnosis not present

## 2021-06-25 DIAGNOSIS — Z79899 Other long term (current) drug therapy: Secondary | ICD-10-CM | POA: Diagnosis not present

## 2021-06-25 DIAGNOSIS — K2100 Gastro-esophageal reflux disease with esophagitis, without bleeding: Secondary | ICD-10-CM | POA: Diagnosis present

## 2021-06-25 DIAGNOSIS — Z20822 Contact with and (suspected) exposure to covid-19: Secondary | ICD-10-CM | POA: Diagnosis present

## 2021-06-25 DIAGNOSIS — I10 Essential (primary) hypertension: Secondary | ICD-10-CM | POA: Diagnosis present

## 2021-06-25 DIAGNOSIS — F1410 Cocaine abuse, uncomplicated: Secondary | ICD-10-CM | POA: Diagnosis present

## 2021-06-25 DIAGNOSIS — E860 Dehydration: Secondary | ICD-10-CM | POA: Diagnosis present

## 2021-06-25 DIAGNOSIS — K209 Esophagitis, unspecified without bleeding: Secondary | ICD-10-CM

## 2021-06-25 DIAGNOSIS — Z882 Allergy status to sulfonamides status: Secondary | ICD-10-CM

## 2021-06-25 DIAGNOSIS — E785 Hyperlipidemia, unspecified: Secondary | ICD-10-CM | POA: Diagnosis present

## 2021-06-25 DIAGNOSIS — K254 Chronic or unspecified gastric ulcer with hemorrhage: Secondary | ICD-10-CM | POA: Diagnosis present

## 2021-06-25 DIAGNOSIS — F17210 Nicotine dependence, cigarettes, uncomplicated: Secondary | ICD-10-CM | POA: Diagnosis present

## 2021-06-25 DIAGNOSIS — E871 Hypo-osmolality and hyponatremia: Principal | ICD-10-CM | POA: Diagnosis present

## 2021-06-25 DIAGNOSIS — R1084 Generalized abdominal pain: Secondary | ICD-10-CM

## 2021-06-25 DIAGNOSIS — E861 Hypovolemia: Secondary | ICD-10-CM | POA: Diagnosis present

## 2021-06-25 DIAGNOSIS — R112 Nausea with vomiting, unspecified: Secondary | ICD-10-CM | POA: Diagnosis present

## 2021-06-25 DIAGNOSIS — K2970 Gastritis, unspecified, without bleeding: Secondary | ICD-10-CM | POA: Diagnosis present

## 2021-06-25 DIAGNOSIS — F1290 Cannabis use, unspecified, uncomplicated: Secondary | ICD-10-CM | POA: Diagnosis present

## 2021-06-25 DIAGNOSIS — E43 Unspecified severe protein-calorie malnutrition: Secondary | ICD-10-CM | POA: Diagnosis present

## 2021-06-25 DIAGNOSIS — K312 Hourglass stricture and stenosis of stomach: Secondary | ICD-10-CM | POA: Diagnosis not present

## 2021-06-25 DIAGNOSIS — K311 Adult hypertrophic pyloric stenosis: Secondary | ICD-10-CM | POA: Diagnosis present

## 2021-06-25 DIAGNOSIS — R109 Unspecified abdominal pain: Secondary | ICD-10-CM | POA: Diagnosis present

## 2021-06-25 DIAGNOSIS — N179 Acute kidney failure, unspecified: Secondary | ICD-10-CM | POA: Diagnosis present

## 2021-06-25 DIAGNOSIS — K259 Gastric ulcer, unspecified as acute or chronic, without hemorrhage or perforation: Secondary | ICD-10-CM | POA: Diagnosis not present

## 2021-06-25 DIAGNOSIS — K264 Chronic or unspecified duodenal ulcer with hemorrhage: Secondary | ICD-10-CM | POA: Diagnosis present

## 2021-06-25 DIAGNOSIS — R1013 Epigastric pain: Secondary | ICD-10-CM

## 2021-06-25 DIAGNOSIS — K256 Chronic or unspecified gastric ulcer with both hemorrhage and perforation: Secondary | ICD-10-CM | POA: Diagnosis present

## 2021-06-25 DIAGNOSIS — Z888 Allergy status to other drugs, medicaments and biological substances status: Secondary | ICD-10-CM

## 2021-06-25 DIAGNOSIS — K3189 Other diseases of stomach and duodenum: Secondary | ICD-10-CM | POA: Diagnosis not present

## 2021-06-25 DIAGNOSIS — Z8673 Personal history of transient ischemic attack (TIA), and cerebral infarction without residual deficits: Secondary | ICD-10-CM | POA: Diagnosis not present

## 2021-06-25 LAB — COMPREHENSIVE METABOLIC PANEL
ALT: 14 U/L (ref 0–44)
AST: 21 U/L (ref 15–41)
Albumin: 4.2 g/dL (ref 3.5–5.0)
Alkaline Phosphatase: 84 U/L (ref 38–126)
Anion gap: 13 (ref 5–15)
BUN: 35 mg/dL — ABNORMAL HIGH (ref 6–20)
CO2: 36 mmol/L — ABNORMAL HIGH (ref 22–32)
Calcium: 8.7 mg/dL — ABNORMAL LOW (ref 8.9–10.3)
Chloride: 77 mmol/L — ABNORMAL LOW (ref 98–111)
Creatinine, Ser: 2.02 mg/dL — ABNORMAL HIGH (ref 0.44–1.00)
GFR, Estimated: 30 mL/min — ABNORMAL LOW (ref >60)
Glucose, Bld: 96 mg/dL (ref 70–99)
Potassium: 2.2 mmol/L — CL (ref 3.5–5.1)
Sodium: 126 mmol/L — ABNORMAL LOW (ref 135–145)
Total Bilirubin: 0.6 mg/dL (ref 0.3–1.2)
Total Protein: 8 g/dL (ref 6.5–8.1)

## 2021-06-25 LAB — MAGNESIUM: Magnesium: 1.9 mg/dL (ref 1.7–2.4)

## 2021-06-25 LAB — BASIC METABOLIC PANEL
Anion gap: 20 — ABNORMAL HIGH (ref 5–15)
BUN: 35 mg/dL — ABNORMAL HIGH (ref 6–20)
CO2: 32 mmol/L (ref 22–32)
Calcium: 9 mg/dL (ref 8.9–10.3)
Chloride: 77 mmol/L — ABNORMAL LOW (ref 98–111)
Creatinine, Ser: 2.15 mg/dL — ABNORMAL HIGH (ref 0.44–1.00)
GFR, Estimated: 28 mL/min — ABNORMAL LOW (ref >60)
Glucose, Bld: 89 mg/dL (ref 70–99)
Potassium: 2.3 mmol/L — CL (ref 3.5–5.1)
Sodium: 129 mmol/L — ABNORMAL LOW (ref 135–145)

## 2021-06-25 LAB — CBC
HCT: 46.2 % — ABNORMAL HIGH (ref 36.0–46.0)
Hemoglobin: 15.8 g/dL — ABNORMAL HIGH (ref 12.0–15.0)
MCH: 30.7 pg (ref 26.0–34.0)
MCHC: 34.2 g/dL (ref 30.0–36.0)
MCV: 89.7 fL (ref 80.0–100.0)
Platelets: 300 10*3/uL (ref 150–400)
RBC: 5.15 MIL/uL — ABNORMAL HIGH (ref 3.87–5.11)
RDW: 13 % (ref 11.5–15.5)
WBC: 8.1 10*3/uL (ref 4.0–10.5)
nRBC: 0 % (ref 0.0–0.2)

## 2021-06-25 LAB — RESP PANEL BY RT-PCR (FLU A&B, COVID) ARPGX2
Influenza A by PCR: NEGATIVE
Influenza B by PCR: NEGATIVE
SARS Coronavirus 2 by RT PCR: NEGATIVE

## 2021-06-25 LAB — LIPASE, BLOOD: Lipase: 36 U/L (ref 11–51)

## 2021-06-25 LAB — HCG, SERUM, QUALITATIVE: Preg, Serum: NEGATIVE

## 2021-06-25 MED ORDER — DICYCLOMINE HCL 10 MG PO CAPS: 10.0000 mg | ORAL_CAPSULE | Freq: Three times a day (TID) | ORAL | Status: DC

## 2021-06-25 MED ORDER — POTASSIUM CHLORIDE 20 MEQ PO PACK
40.0000 meq | PACK | Freq: Two times a day (BID) | ORAL | Status: DC
  Administered 2021-06-25 (×2): 40 meq via ORAL
  Filled 2021-06-25 (×2): qty 2

## 2021-06-25 MED ORDER — ALUM & MAG HYDROXIDE-SIMETH 200-200-20 MG/5ML PO SUSP
30.0000 mL | Freq: Once | ORAL | Status: AC
  Administered 2021-06-25: 30 mL via ORAL
  Filled 2021-06-25: qty 30

## 2021-06-25 MED ORDER — LIDOCAINE VISCOUS HCL 2 % MT SOLN
15.0000 mL | Freq: Once | OROMUCOSAL | Status: AC
  Administered 2021-06-25: 15 mL via ORAL
  Filled 2021-06-25: qty 15

## 2021-06-25 MED ORDER — FENTANYL CITRATE PF 50 MCG/ML IJ SOSY
25.0000 ug | PREFILLED_SYRINGE | Freq: Once | INTRAMUSCULAR | Status: AC
  Administered 2021-06-25: 25 ug via INTRAVENOUS
  Filled 2021-06-25: qty 1

## 2021-06-25 MED ORDER — ENOXAPARIN SODIUM 40 MG/0.4ML IJ SOSY
40.0000 mg | PREFILLED_SYRINGE | INTRAMUSCULAR | Status: DC
  Administered 2021-06-25 – 2021-06-27 (×2): 40 mg via SUBCUTANEOUS
  Filled 2021-06-25 (×2): qty 0.4

## 2021-06-25 MED ORDER — DICYCLOMINE HCL 10 MG PO CAPS
10.0000 mg | ORAL_CAPSULE | Freq: Four times a day (QID) | ORAL | Status: DC | PRN
  Administered 2021-06-25 – 2021-06-28 (×5): 10 mg via ORAL
  Filled 2021-06-25 (×5): qty 1

## 2021-06-25 MED ORDER — ONDANSETRON HCL 4 MG/2ML IJ SOLN
4.0000 mg | Freq: Four times a day (QID) | INTRAMUSCULAR | Status: DC | PRN
  Administered 2021-06-25 – 2021-06-29 (×10): 4 mg via INTRAVENOUS
  Filled 2021-06-25 (×11): qty 2

## 2021-06-25 MED ORDER — LACTATED RINGERS IV SOLN: INTRAVENOUS | Status: DC

## 2021-06-25 MED ORDER — ENSURE ENLIVE PO LIQD: 237.0000 mL | Freq: Two times a day (BID) | ORAL | Status: DC

## 2021-06-25 MED ORDER — TRAMADOL HCL 50 MG PO TABS
50.0000 mg | ORAL_TABLET | Freq: Four times a day (QID) | ORAL | Status: DC | PRN
  Administered 2021-06-25: 50 mg via ORAL
  Filled 2021-06-25: qty 1

## 2021-06-25 MED ORDER — POTASSIUM CHLORIDE 10 MEQ/100ML IV SOLN
10.0000 meq | INTRAVENOUS | Status: AC
  Administered 2021-06-25 (×3): 10 meq via INTRAVENOUS
  Filled 2021-06-25 (×3): qty 100

## 2021-06-25 MED ORDER — PANTOPRAZOLE SODIUM 40 MG IV SOLR
40.0000 mg | Freq: Once | INTRAVENOUS | Status: AC
  Administered 2021-06-25: 40 mg via INTRAVENOUS
  Filled 2021-06-25: qty 10

## 2021-06-25 MED ORDER — PANTOPRAZOLE SODIUM 40 MG PO TBEC
40.0000 mg | DELAYED_RELEASE_TABLET | Freq: Two times a day (BID) | ORAL | Status: DC
  Administered 2021-06-25: 40 mg via ORAL
  Filled 2021-06-25: qty 1

## 2021-06-25 MED ORDER — POTASSIUM CHLORIDE CRYS ER 20 MEQ PO TBCR
40.0000 meq | EXTENDED_RELEASE_TABLET | Freq: Two times a day (BID) | ORAL | Status: DC
  Administered 2021-06-25: 40 meq via ORAL
  Filled 2021-06-25: qty 2

## 2021-06-25 MED ORDER — SODIUM CHLORIDE 0.9 % IV BOLUS
1000.0000 mL | Freq: Once | INTRAVENOUS | Status: AC
  Administered 2021-06-25: 1000 mL via INTRAVENOUS

## 2021-06-25 MED ORDER — ONDANSETRON HCL 4 MG/2ML IJ SOLN
4.0000 mg | Freq: Once | INTRAMUSCULAR | Status: AC
  Administered 2021-06-25: 4 mg via INTRAVENOUS
  Filled 2021-06-25: qty 2

## 2021-06-25 MED ORDER — HYDROMORPHONE HCL 1 MG/ML IJ SOLN
0.5000 mg | INTRAMUSCULAR | Status: DC | PRN
  Administered 2021-06-25 – 2021-06-27 (×9): 0.5 mg via INTRAVENOUS
  Filled 2021-06-25 (×9): qty 0.5

## 2021-06-25 NOTE — ED Notes (Signed)
Critical Lab: Potassium 2.2.  Provider notified.

## 2021-06-25 NOTE — H&P (Signed)
History and Physical    Patient: Kristina Cardenas AUQ:[id number] DOB: 04/21/1976 DOA: 06/25/2021 DOS: the patient was seen and examined on 06/25/2021 PCP: Pcp, No  Patient coming from: Home  Chief Complaint:  Chief Complaint  Patient presents with   Abdominal Pain    HPI: Kristina Cardenas is a 46 y.o. female with medical history significant of GERD, stomach ulcers, Bell's palsy, history of stroke, substance abuse.  Patient presents with 3 to 4-day history of burning abdominal pain with intractable vomiting over the past week.  She has a history of peptic ulcer disease with upper GI bleed.  She thought that this was due to that.  Denies fevers, chills, chest pain.  She does continue smoking marijuana.  She was told that her marijuana that she had yesterday had something there that would help her abdominal pain.  She does not know what it was.  Review of Systems: As mentioned in the history of present illness. All other systems reviewed and are negative. Past Medical History:  Diagnosis Date   Bell's palsy    COVID    GERD (gastroesophageal reflux disease)    History of stomach ulcers    Stroke [facility])    Past Surgical History:  Procedure Laterality Date   ABDOMINAL SURGERY     Social History:  reports that she has been smoking cigarettes. She has been smoking an average of 1 pack per day. She has never used smokeless tobacco. She reports that she does not currently use alcohol. She reports that she does not currently use drugs.  Allergies  Allergen Reactions   Sulfa Antibiotics    Toradol [Ketorolac Tromethamine]     History reviewed. No pertinent family history.  Prior to Admission medications   Medication Sig Start Date End Date Taking? Authorizing Provider  pantoprazole (PROTONIX) 40 MG tablet Take 1 tablet (40 mg total) by mouth 2 (two) times daily. 04/03/21   Haskel Schroeder, PA-C    Physical Exam: Vitals:   06/25/21 1330 06/25/21 1400 06/25/21 1522 06/25/21 1630   BP: 92/63 (!) 85/66 (!) 94/59 (!) 87/62  Pulse:   73 63  Resp: 19 16 18 16   Temp:      TempSrc:      SpO2:   99% 99%  Weight:      Height:       General: Middle-age female. Awake and alert and oriented x3. No acute cardiopulmonary distress.  HEENT: Normocephalic atraumatic.  Right and left ears normal in appearance.  Pupils equal, round, reactive to light. Extraocular muscles are intact. Sclerae anicteric and noninjected.  Moist mucosal membranes. No mucosal lesions.  Neck: Neck supple without lymphadenopathy. No carotid bruits. No masses palpated.  Cardiovascular: Regular rate with normal S1-S2 sounds. No murmurs, rubs, gallops auscultated. No JVD.  Respiratory: Good respiratory effort with no wheezes, rales, rhonchi. Lungs clear to auscultation bilaterally.  No accessory muscle use. Abdomen: Soft, mild tenderness in the epigastric and left upper quadrant, nondistended. Active bowel sounds. No masses or hepatosplenomegaly  Skin: No rashes, lesions, or ulcerations.  Dry, warm to touch. 2+ dorsalis pedis and radial pulses. Musculoskeletal: No calf or leg pain. All major joints not erythematous nontender.  No upper or lower joint deformation.  Good ROM.  No contractures  Psychiatric: Intact judgment and insight. Pleasant and cooperative. Neurologic: No focal neurological deficits. Strength is 5/5 and symmetric in upper and lower extremities.  Cranial nerves II through XII are grossly intact.   Data Reviewed: Results for orders placed  or performed during the hospital encounter of 06/25/21 (from the past 24 hour(s))  Lipase, blood     Status: None   Collection Time: 06/25/21 11:57 AM  Result Value Ref Range   Lipase 36 11 - 51 U/L  Comprehensive metabolic panel     Status: Abnormal   Collection Time: 06/25/21 11:57 AM  Result Value Ref Range   Sodium 126 (L) 135 - 145 mmol/L   Potassium 2.2 (LL) 3.5 - 5.1 mmol/L   Chloride 77 (L) 98 - 111 mmol/L   CO2 36 (H) 22 - 32 mmol/L   Glucose,  Bld 96 70 - 99 mg/dL   BUN 35 (H) 6 - 20 mg/dL   Creatinine, Ser 2.59 (H) 0.44 - 1.00 mg/dL   Calcium 8.7 (L) 8.9 - 10.3 mg/dL   Total Protein 8.0 6.5 - 8.1 g/dL   Albumin 4.2 3.5 - 5.0 g/dL   AST 21 15 - 41 U/L   ALT 14 0 - 44 U/L   Alkaline Phosphatase 84 38 - 126 U/L   Total Bilirubin 0.6 0.3 - 1.2 mg/dL   GFR, Estimated 30 (L) >60 mL/min   Anion gap 13 5 - 15  CBC     Status: Abnormal   Collection Time: 06/25/21 11:57 AM  Result Value Ref Range   WBC 8.1 4.0 - 10.5 K/uL   RBC 5.15 (H) 3.87 - 5.11 MIL/uL   Hemoglobin 15.8 (H) 12.0 - 15.0 g/dL   HCT 56.3 (H) 87.5 - 64.3 %   MCV 89.7 80.0 - 100.0 fL   MCH 30.7 26.0 - 34.0 pg   MCHC 34.2 30.0 - 36.0 g/dL   RDW 32.9 51.8 - 84.1 %   Platelets 300 150 - 400 K/uL   nRBC 0.0 0.0 - 0.2 %  hCG, serum, qualitative     Status: None   Collection Time: 06/25/21  2:00 PM  Result Value Ref Range   Preg, Serum NEGATIVE NEGATIVE   CT ABDOMEN PELVIS WO CONTRAST  Result Date: 06/25/2021 CLINICAL DATA:  Abdominal pain EXAM: CT ABDOMEN AND PELVIS WITHOUT CONTRAST TECHNIQUE: Multidetector CT imaging of the abdomen and pelvis was performed following the standard protocol without IV contrast. RADIATION DOSE REDUCTION: This exam was performed according to the departmental dose-optimization program which includes automated exposure control, adjustment of the mA and/or kV according to patient size and/or use of iterative reconstruction technique. COMPARISON:  12/24/2020 FINDINGS: Lower chest: Visualized lower lung fields are clear. There are pockets of air in the wall of the lower thoracic esophagus. Hepatobiliary: There is air in the lumen of intrahepatic and extrahepatic bile ducts. There is air in the lumen of gallbladder. Pancreas: No focal abnormality is seen. Spleen: Unremarkable. Adrenals/Urinary Tract: Adrenals are not enlarged. There is possible minimal calcification in the right adrenal. There is no hydronephrosis. There are few small right renal  stones each measuring less than 3 mm. Ureters are not dilated. Urinary bladder is unremarkable. Stomach/Bowel: Stomach is moderately distended with air-fluid level. There is no significant small bowel dilation. Appendix is not dilated. There is no significant wall thickening in the colon. There is no pericolic stranding or fluid collection. Vascular/Lymphatic: Scattered arterial calcifications are seen. Reproductive: Uterus is unremarkable. There are no dominant adnexal masses. Other: There is no ascites or pneumoperitoneum. Musculoskeletal: There is first-degree anterolisthesis at L4-L5 level. Spondylolysis is seen in the L5 vertebra. Spinal stenosis and encroachment of neural foramina is seen at multiple levels in the lumbar spine. IMPRESSION: There is  no evidence of intestinal obstruction or pneumoperitoneum. Appendix is not dilated. There is no hydronephrosis. There is air in the lumen of bile ducts and gallbladder possibly suggesting previous sphincterotomy. There is no significant dilation of bile ducts. There are few small right renal stones. Lumbar spondylosis with spinal stenosis and encroachment of neural foramina at multiple levels. Spondylolysis is seen in the L5 vertebra with first-degree spondylolisthesis at L5-S1 level. There are small pockets of air in the wall of the visualized lower thoracic esophagus close to the gastroesophageal junction. Significance of this pneumatosis is not clearly evident. This may be a benign process or suggest inflammatory or infectious process. Electronically Signed   By: Ernie Avena M.D.   On: 06/25/2021 15:41     Assessment and Plan: No notes have been filed under this hospital service. Service: Hospitalist  Principal Problem:   Acute hyponatremia Active Problems:   Acute hypokalemia   Nausea and vomiting   Dehydration   Hyponatremia  Patient discussed with ER provider regarding the pertinent labs, imaging, exam findings, and treatment in the  emergency department.  Acute hyponatremia Likely secondary to dehydration Admits IV fluids BMP later tonight Appears to be acute hypovolemic hyponatremia We will check urine studies to be sure Acute hypokalemia Probably secondary to nausea and vomiting. Question whether this represents withdrawal We will give Bentyl, IV fluids support, and antiemetics Recheck potassium in the morning Replace potassium Check magnesium  Dehydration Nausea and vomiting    Advance Care Planning:   Code Status: Prior full code  Consults: none  Family Communication: none  Severity of Illness: The appropriate patient status for this patient is INPATIENT. Inpatient status is judged to be reasonable and necessary in order to provide the required intensity of service to ensure the patient's safety. The patient's presenting symptoms, physical exam findings, and initial radiographic and laboratory data in the context of their chronic comorbidities is felt to place them at high risk for further clinical deterioration. Furthermore, it is not anticipated that the patient will be medically stable for discharge from the hospital within 2 midnights of admission.   * I certify that at the point of admission it is my clinical judgment that the patient will require inpatient hospital care spanning beyond 2 midnights from the point of admission due to high intensity of service, high risk for further deterioration and high frequency of surveillance required.*  Author: Levie Heritage, DO 06/25/2021 5:20 PM  For on call review [email or web address].

## 2021-06-25 NOTE — ED Provider Notes (Signed)
[facility] EMERGENCY DEPARTMENT Provider Note   CSN: [id number] Arrival date & time: 06/25/21  1051     History Chief Complaint  Patient presents with   Abdominal Pain    Kristina Cardenas is a 46 y.o. female with history of peptic ulcer disease with gastric perforation, hypertension, hyperlipidemia, upper GI bleed, and cocaine abuse who presents to the emergency department with a 1/2-week history of burning gnawing abdominal pain.  Patient reports associated intractable nausea and vomiting without any hematemesis.  Patient states she has not been taking any anti-inflammatories including ibuprofen.  She reports associated marijuana use but denies any other drug use.  No alcohol use.  No hematochezia or melena.  She denies chest pain, shortness of breath, diarrhea.  No fever or chills.  No urinary complaints.  HPI     Home Medications Prior to Admission medications   Medication Sig Start Date End Date Taking? Authorizing Provider  pantoprazole (PROTONIX) 40 MG tablet Take 1 tablet (40 mg total) by mouth 2 (two) times daily. 04/03/21   Loni Beckwith, PA-C      Allergies    Sulfa antibiotics and Toradol [ketorolac tromethamine]    Review of Systems   Review of Systems  All other systems reviewed and are negative.  Physical Exam Updated Vital Signs BP (!) 87/62    Pulse 63    Temp 98.1 F (36.7 C) (Oral)    Resp 16    Ht 5\' 8"  (1.727 m)    Wt 57.2 kg    SpO2 99%    BMI 19.17 kg/m  Physical Exam Vitals and nursing note reviewed.  Constitutional:      General: She is not in acute distress.    Appearance: Normal appearance.  HENT:     Head: Normocephalic and atraumatic.  Eyes:     General:        Right eye: No discharge.        Left eye: No discharge.  Cardiovascular:     Comments: Regular rate and rhythm.  S1/S2 are distinct without any evidence of murmur, rubs, or gallops.  Radial pulses are 2+ bilaterally.  Dorsalis pedis pulses are 2+ bilaterally.  No evidence of  pedal edema. Pulmonary:     Comments: Clear to auscultation bilaterally.  Normal effort.  No respiratory distress.  No evidence of wheezes, rales, or rhonchi heard throughout. Abdominal:     General: Abdomen is flat. Bowel sounds are normal. There is no distension.     Tenderness: There is generalized abdominal tenderness. There is no guarding or rebound.  Musculoskeletal:        General: Normal range of motion.     Cervical back: Neck supple.  Skin:    General: Skin is warm and dry.     Findings: No rash.  Neurological:     General: No focal deficit present.     Mental Status: She is alert.  Psychiatric:        Mood and Affect: Mood normal.        Behavior: Behavior normal.    ED Results / Procedures / Treatments   Labs (all labs ordered are listed, but only abnormal results are displayed) Labs Reviewed  COMPREHENSIVE METABOLIC PANEL - Abnormal; Notable for the following components:      Result Value   Sodium 126 (*)    Potassium 2.2 (*)    Chloride 77 (*)    CO2 36 (*)    BUN 35 (*)  Creatinine, Ser 2.02 (*)    Calcium 8.7 (*)    GFR, Estimated 30 (*)    All other components within normal limits  CBC - Abnormal; Notable for the following components:   RBC 5.15 (*)    Hemoglobin 15.8 (*)    HCT 46.2 (*)    All other components within normal limits  LIPASE, BLOOD  HCG, SERUM, QUALITATIVE  URINALYSIS, ROUTINE W REFLEX MICROSCOPIC    EKG None  Radiology CT ABDOMEN PELVIS WO CONTRAST  Result Date: 06/25/2021 CLINICAL DATA:  Abdominal pain EXAM: CT ABDOMEN AND PELVIS WITHOUT CONTRAST TECHNIQUE: Multidetector CT imaging of the abdomen and pelvis was performed following the standard protocol without IV contrast. RADIATION DOSE REDUCTION: This exam was performed according to the departmental dose-optimization program which includes automated exposure control, adjustment of the mA and/or kV according to patient size and/or use of iterative reconstruction technique.  COMPARISON:  12/24/2020 FINDINGS: Lower chest: Visualized lower lung fields are clear. There are pockets of air in the wall of the lower thoracic esophagus. Hepatobiliary: There is air in the lumen of intrahepatic and extrahepatic bile ducts. There is air in the lumen of gallbladder. Pancreas: No focal abnormality is seen. Spleen: Unremarkable. Adrenals/Urinary Tract: Adrenals are not enlarged. There is possible minimal calcification in the right adrenal. There is no hydronephrosis. There are few small right renal stones each measuring less than 3 mm. Ureters are not dilated. Urinary bladder is unremarkable. Stomach/Bowel: Stomach is moderately distended with air-fluid level. There is no significant small bowel dilation. Appendix is not dilated. There is no significant wall thickening in the colon. There is no pericolic stranding or fluid collection. Vascular/Lymphatic: Scattered arterial calcifications are seen. Reproductive: Uterus is unremarkable. There are no dominant adnexal masses. Other: There is no ascites or pneumoperitoneum. Musculoskeletal: There is first-degree anterolisthesis at L4-L5 level. Spondylolysis is seen in the L5 vertebra. Spinal stenosis and encroachment of neural foramina is seen at multiple levels in the lumbar spine. IMPRESSION: There is no evidence of intestinal obstruction or pneumoperitoneum. Appendix is not dilated. There is no hydronephrosis. There is air in the lumen of bile ducts and gallbladder possibly suggesting previous sphincterotomy. There is no significant dilation of bile ducts. There are few small right renal stones. Lumbar spondylosis with spinal stenosis and encroachment of neural foramina at multiple levels. Spondylolysis is seen in the L5 vertebra with first-degree spondylolisthesis at L5-S1 level. There are small pockets of air in the wall of the visualized lower thoracic esophagus close to the gastroesophageal junction. Significance of this pneumatosis is not clearly  evident. This may be a benign process or suggest inflammatory or infectious process. Electronically Signed   By: Elmer Picker M.D.   On: 06/25/2021 15:41    Procedures .Critical Care Performed by: Hendricks Limes, PA-C Authorized by: Hendricks Limes, PA-C   Critical care provider statement:    Critical care time (minutes):  35   Critical care time was exclusive of:  Separately billable procedures and treating other patients   Critical care was necessary to treat or prevent imminent or life-threatening deterioration of the following conditions:  Metabolic crisis   Critical care was time spent personally by me on the following activities:  Ordering and performing treatments and interventions, pulse oximetry, re-evaluation of patient's condition, review of old charts, ordering and review of laboratory studies and evaluation of patient's response to treatment   I assumed direction of critical care for this patient from another provider in my specialty: no  Medications Ordered in ED Medications  potassium chloride 10 mEq in 100 mL IVPB (10 mEq Intravenous New Bag/Given 06/25/21 1708)  potassium chloride (KLOR-CON) packet 40 mEq (40 mEq Oral Given 06/25/21 1546)  sodium chloride 0.9 % bolus 1,000 mL (0 mLs Intravenous Stopped 06/25/21 1546)  pantoprazole (PROTONIX) injection 40 mg (40 mg Intravenous Given 06/25/21 1232)  ondansetron (ZOFRAN) injection 4 mg (4 mg Intravenous Given 06/25/21 1240)  sodium chloride 0.9 % bolus 1,000 mL (1,000 mLs Intravenous New Bag/Given 06/25/21 1500)  fentaNYL (SUBLIMAZE) injection 25 mcg (25 mcg Intravenous Given 06/25/21 1627)  alum & mag hydroxide-simeth (MAALOX/MYLANTA) 200-200-20 MG/5ML suspension 30 mL (30 mLs Oral Given 06/25/21 1627)    And  lidocaine (XYLOCAINE) 2 % viscous mouth solution 15 mL (15 mLs Oral Given 06/25/21 1627)    ED Course/ Medical Decision Making/ A&P Clinical Course as of 06/25/21 1720  Sat Jun 25, 2021  1545 Patient  refused urinalysis. [CF]  [id number] Spoke with Dr. Nehemiah Settle with [facility] hospitalist who agrees to admit the patient. [CF]    Clinical Course User Index [CF] Hendricks Limes, PA-C                           Medical Decision Making Amount and/or Complexity of Data Reviewed Labs: ordered. Radiology: ordered.  Risk OTC drugs. Prescription drug management. Decision regarding hospitalization.   This patient presents to the ED for concern of diffuse abdominal pain, this involves an extensive number of treatment options, and is a complaint that carries with it a high risk of complications and morbidity.  The differential diagnosis includes recurrence of PUD, gastritis, pancreatitis, gastric perforation, hepatobiliary disease, appendicitis, diverticulitis.   Co morbidities that complicate the patient evaluation  Smoking PUD with gastric perforation   Additional history obtained:  Additional history obtained from old records and nursing note External records from outside source obtained and reviewed including old discharge summary from 12/24/2020 where patient left AMA where she presented similar to her presentation today.  There was concern for gastric perforation at that time.  General surgery was consulted and recommended nonoperative management.   Lab Tests:  I Ordered, and personally interpreted labs.  The pertinent results include: CBC without evidence of leukocytosis.  CMP did show evidence of profound hypokalemia and AKI.  Creatinine up significantly from baseline.  Patient refused urinalysis.  Negative pregnancy.  Lipase normal.  I doubt pancreatitis.   Imaging Studies ordered:  I ordered imaging studies including CT abdomen pelvis with contrast I independently visualized and interpreted imaging which showed small amount of air towards the gastroesophageal junction which is questionable to be infectious versus benign. I agree with the radiologist interpretation   Cardiac  Monitoring:  The patient was maintained on a cardiac monitor.  I personally viewed and interpreted the cardiac monitored which showed an underlying rhythm of: Patient was hypotensive with normal heart rate.   Medicines ordered and prescription drug management:  I ordered medication including fluid bolus x2 for likely hypotension in the setting of hypovolemia from vomiting. Reevaluation of the patient after these medicines showed that the patient improved I have reviewed the patients home medicines and have made adjustments as needed.   Critical Interventions:  IV and p.o. potassium for profound hypokalemia Fluid bolus x2 for AKI and hypotension   Problem List / ED Course:  Hypokalemia: This is likely from intractable vomiting that she is been having over the last week.  Patient does have a  history of hypokalemia in the past.  Patient still having abdominal pain despite GI cocktail and fentanyl.  Patient requesting multiple times to eat some food.  No vomiting in the department.  Patient does not have any focal tenderness on my exam today. Although she is generally tender. CT abdomen did not show any obvious perforation, obstruction, or pneumoperitoneum.  Given her abdominal pain, hypokalemia, and hypotension I do believe that she would benefit from further evaluation in the hospital.  Hypokalemia was begun to be repleted in the department. Hypotension: Fluid bolus x2.  Patient still having persistent hypotensive episodes.  She is asymptomatic from this and is not complaining of any dizziness, lightheadedness, chest pain, shortness of breath or syncopal episodes.   Reevaluation:  After the interventions noted above, I reevaluated the patient and found that they have :stayed the same   Social Determinants of Health:  Lack of primary care follow-up Polysubstance abuse   Dispostion:  After consideration of the diagnostic results and the patients response to treatment, I feel that the  patent would benefit from admission in the hospital.  Final Clinical Impression(s) / ED Diagnoses Final diagnoses:  Hypokalemia  Generalized abdominal pain    Rx / DC Orders ED Discharge Orders     None         Cherrie Gauze 06/25/21 Daron Offer, MD 06/27/21 [phone]

## 2021-06-25 NOTE — ED Triage Notes (Signed)
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 Abdomen pain present.

## 2021-06-25 NOTE — ED Notes (Addendum)
Pt is refusing to give urine sample and refused bladder scanner/in and out. Nurse notified.

## 2021-06-26 LAB — BASIC METABOLIC PANEL
Anion gap: 6 (ref 5–15)
BUN: 20 mg/dL (ref 6–20)
CO2: 35 mmol/L — ABNORMAL HIGH (ref 22–32)
Calcium: 8.1 mg/dL — ABNORMAL LOW (ref 8.9–10.3)
Chloride: 92 mmol/L — ABNORMAL LOW (ref 98–111)
Creatinine, Ser: 1 mg/dL (ref 0.44–1.00)
GFR, Estimated: >60 mL/min (ref >60)
Glucose, Bld: 86 mg/dL (ref 70–99)
Potassium: 2.7 mmol/L — CL (ref 3.5–5.1)
Sodium: 133 mmol/L — ABNORMAL LOW (ref 135–145)

## 2021-06-26 LAB — MAGNESIUM: Magnesium: 1.7 mg/dL (ref 1.7–2.4)

## 2021-06-26 LAB — CBC
HCT: 38.2 % (ref 36.0–46.0)
Hemoglobin: 12 g/dL (ref 12.0–15.0)
MCH: 29.4 pg (ref 26.0–34.0)
MCHC: 31.4 g/dL (ref 30.0–36.0)
MCV: 93.6 fL (ref 80.0–100.0)
Platelets: 171 10*3/uL (ref 150–400)
RBC: 4.08 MIL/uL (ref 3.87–5.11)
RDW: 12.9 % (ref 11.5–15.5)
WBC: 4.8 10*3/uL (ref 4.0–10.5)
nRBC: 0 % (ref 0.0–0.2)

## 2021-06-26 MED ORDER — POTASSIUM CHLORIDE 10 MEQ/100ML IV SOLN
10.0000 meq | INTRAVENOUS | Status: AC
  Administered 2021-06-26 (×4): 10 meq via INTRAVENOUS
  Filled 2021-06-26 (×4): qty 100

## 2021-06-26 MED ORDER — LACTATED RINGERS IV SOLN
INTRAVENOUS | Status: AC
  Filled 2021-06-26 (×5): qty 1000

## 2021-06-26 MED ORDER — OXYCODONE HCL 5 MG PO TABS
5.0000 mg | ORAL_TABLET | Freq: Four times a day (QID) | ORAL | Status: DC | PRN
  Administered 2021-06-26 – 2021-06-27 (×4): 5 mg via ORAL
  Filled 2021-06-26 (×5): qty 1

## 2021-06-26 MED ORDER — MAGNESIUM SULFATE 2 GM/50ML IV SOLN
2.0000 g | Freq: Once | INTRAVENOUS | Status: AC
  Administered 2021-06-26: 2 g via INTRAVENOUS
  Filled 2021-06-26: qty 50

## 2021-06-26 MED ORDER — SODIUM CHLORIDE 0.9 % IV BOLUS
500.0000 mL | Freq: Once | INTRAVENOUS | Status: AC
  Administered 2021-06-26: 500 mL via INTRAVENOUS

## 2021-06-26 MED ORDER — SODIUM CHLORIDE 0.9 % IV SOLN
12.5000 mg | Freq: Four times a day (QID) | INTRAVENOUS | Status: DC | PRN
  Administered 2021-06-26 – 2021-06-28 (×4): 12.5 mg via INTRAVENOUS
  Filled 2021-06-26: qty 12.5
  Filled 2021-06-26 (×3): qty 0.5

## 2021-06-26 MED ORDER — PANTOPRAZOLE SODIUM 40 MG IV SOLR
40.0000 mg | Freq: Two times a day (BID) | INTRAVENOUS | Status: DC
  Administered 2021-06-26 – 2021-06-29 (×6): 40 mg via INTRAVENOUS
  Filled 2021-06-26 (×6): qty 10

## 2021-06-26 MED ORDER — SODIUM CHLORIDE 0.9 % IV BOLUS
500.0000 mL | Freq: Once | INTRAVENOUS | Status: AC
Start: 2021-06-26 — End: 2021-06-26
  Administered 2021-06-26: 500 mL via INTRAVENOUS

## 2021-06-26 NOTE — Progress Notes (Addendum)
PROGRESS NOTE  Kristina Cardenas [id number] DOB: Sep 29, 1975 DOA: 06/25/2021 PCP: Pcp, No  HPI/Recap of past 24 hours: Kristina Cardenas is a 46 y.o. female with medical history significant of GERD, stomach ulcers, Bell's palsy, history of stroke, substance abuse.  Patient presents with 3 to 4-day history of burning abdominal pain with intractable vomiting over the past week.  She has a history of peptic ulcer disease with upper GI bleed.  She thought that this was due to that.  She does continue smoking marijuana.  She was told that her marijuana that she had yesterday had something there that would help her abdominal pain.  She does not know what it was.  Work-up revealed hypovolemic hyponatremia likely from GI losses.  Started on IV fluid hydration, electrolyte replacement and IV antiemetics.  06/26/2021: Patient was seen and examined at her bedside.  She reports having diffuse abdominal pain and nausea with dry heaving.      Assessment/Plan: Principal Problem:   Acute hyponatremia Active Problems:   Acute hypokalemia   Nausea and vomiting   Dehydration   Hyponatremia   Hypovolemic hyponatremia likely secondary to poor oral intake from nausea and vomiting. Presented with serum sodium of 126, currently on LR IV fluid hydration at 100 cc/h.  Serum sodium improved to 133 this morning. Add potassium to IV fluid due to refractory hypokalemia. Encourage oral intake as tolerated with the help of IV antiemetics.  Intractable nausea and vomiting, unclear etiology. 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 IV antiemetics as needed Clear liquid diet, advance diet as tolerated.  Refractory severe hypokalemia Presented with serum potassium 2.2 Add LR KCl 40 mEq at 100 cc/h Replete potassium as indicated. Magnesium 1.7, optimize magnesium level with IV magnesium 2 g x 1 dose.  History of GERD and gastric ulcers Home p.o.  Protonix on hold due to intractable nausea and vomiting. Start IV Protonix 40 mg twice daily  Resolved AKI likely prerenal in the setting of dehydration and poor oral intake Baseline creatinine appears to be 1.0 with GFR greater than 60, currently back to her baseline creatinine. Presented with creatinine of 2.02 with GFR of 30. Continue to avoid nephrotoxic agents, dehydration and hypotension. Closely monitor urine output with strict I's and O's. Repeat renal panel in the morning.    Critical care time: 65 minutes.   Code Status: Full code  Family Communication: None at bedside  Disposition Plan: Likely will discharge to home once symptomatology has improved, is hemodynamically stable and is able to tolerate a soft diet.   Consultants: None  Procedures: None  Antimicrobials: None  DVT prophylaxis: Subcu Lovenox daily.  Status is: Inpatient Patient requires at least 2 midnights for further evaluation and treatment of present condition.            Objective: Vitals:   06/26/21 0425 06/26/21 0429 06/26/21 0521 06/26/21 0534  BP: (!) 85/59 (!) 82/60 92/72 100/67  Pulse: (!) 59   67  Resp:  18  18  Temp: 98 F (36.7 C) 98 F (36.7 C)  98.2 F (36.8 C)  TempSrc: Oral Oral  Oral  SpO2: 100% 100%    Weight:      Height:        Intake/Output Summary (Last 24 hours) at 06/26/2021 0716 Last data filed at 06/25/2021 1825 Gross per 24 hour  Intake 1304.18 ml  Output --  Net 1304.18 ml   Filed Weights   06/25/21 1119  Weight: 57.2  kg    Exam:  General: 46 y.o. year-old female well developed well nourished in no acute distress.  Alert and oriented x3. Cardiovascular: Regular rate and rhythm with no rubs or gallops.  No thyromegaly or JVD noted.   Respiratory: Clear to auscultation with no wheezes or rales. Good inspiratory effort. Abdomen: Soft, diffuse tenderness with palpation nondistended with normal bowel sounds x4 quadrants.  Abdominal scar  noted. Musculoskeletal: No lower extremity edema. 2/4 pulses in all 4 extremities. Skin: No ulcerative lesions noted or rashes, Psychiatry: Mood is appropriate for condition and setting Neuro: Moves all 4 extremities.   Data Reviewed: CBC: Recent Labs  Lab 06/25/21 1157 06/26/21 0453  WBC 8.1 4.8  HGB 15.8* 12.0  HCT 46.2* 38.2  MCV 89.7 93.6  PLT 300 [id number]   Basic Metabolic Panel: Recent Labs  Lab 06/25/21 1157 06/25/21 1901 06/26/21 0453  NA 126* 129* 133*  K 2.2* 2.3* 2.7*  CL 77* 77* 92*  CO2 36* 32 35*  GLUCOSE 96 89 86  BUN 35* 35* 20  CREATININE 2.02* 2.15* 1.00  CALCIUM 8.7* 9.0 8.1*  MG  --  1.9 1.7   GFR: Estimated Creatinine Clearance: 63.5 mL/min (by C-G formula based on SCr of 1 mg/dL). Liver Function Tests: Recent Labs  Lab 06/25/21 1157  AST 21  ALT 14  ALKPHOS 84  BILITOT 0.6  PROT 8.0  ALBUMIN 4.2   Recent Labs  Lab 06/25/21 1157  LIPASE 36   No results for input(s): AMMONIA in the last 168 hours. Coagulation Profile: No results for input(s): INR, PROTIME in the last 168 hours. Cardiac Enzymes: No results for input(s): CKTOTAL, CKMB, CKMBINDEX, TROPONINI in the last 168 hours. BNP (last 3 results) No results for input(s): PROBNP in the last 8760 hours. HbA1C: No results for input(s): HGBA1C in the last 72 hours. CBG: No results for input(s): GLUCAP in the last 168 hours. Lipid Profile: No results for input(s): CHOL, HDL, LDLCALC, TRIG, CHOLHDL, LDLDIRECT in the last 72 hours. Thyroid Function Tests: No results for input(s): TSH, T4TOTAL, FREET4, T3FREE, THYROIDAB in the last 72 hours. Anemia Panel: No results for input(s): VITAMINB12, FOLATE, FERRITIN, TIBC, IRON, RETICCTPCT in the last 72 hours. Urine analysis:    Component Value Date/Time   COLORURINE YELLOW 12/25/2020 0951   APPEARANCEUR CLOUDY (A) 12/25/2020 0951   LABSPEC 1.035 (H) 12/25/2020 0951   PHURINE 7.0 12/25/2020 0951   GLUCOSEU NEGATIVE 12/25/2020 0951   HGBUR  MODERATE (A) 12/25/2020 0951   BILIRUBINUR NEGATIVE 12/25/2020 0951   KETONESUR 20 (A) 12/25/2020 0951   PROTEINUR 30 (A) 12/25/2020 0951   NITRITE NEGATIVE 12/25/2020 0951   LEUKOCYTESUR LARGE (A) 12/25/2020 0951   Sepsis Labs: @LABRCNTIP (procalcitonin:4,lacticidven:4)  ) Recent Results (from the past 240 hour(s))  Resp Panel by RT-PCR (Flu A&B, Covid) Nasopharyngeal Swab     Status: None   Collection Time: 06/25/21  5:40 PM   Specimen: Nasopharyngeal Swab; Nasopharyngeal(NP) swabs in vial transport medium  Result Value Ref Range Status   SARS Coronavirus 2 by RT PCR NEGATIVE NEGATIVE Final    Comment: (NOTE) SARS-CoV-2 target nucleic acids are NOT DETECTED.  The SARS-CoV-2 RNA is generally detectable in upper respiratory specimens during the acute phase of infection. The lowest concentration of SARS-CoV-2 viral copies this assay can detect is 138 copies/mL. A negative result does not preclude SARS-Cov-2 infection and should not be used as the sole basis for treatment or other patient management decisions. A negative result may occur with  improper specimen collection/handling, submission of specimen other than nasopharyngeal swab, presence of viral mutation(s) within the areas targeted by this assay, and inadequate number of viral copies(<138 copies/mL). A negative result must be combined with clinical observations, patient history, and epidemiological information. The expected result is Negative.  Fact Sheet for Patients:  EntrepreneurPulse.com.au  Fact Sheet for Healthcare Providers:  IncredibleEmployment.be  This test is no t yet approved or cleared by the [place] FDA and  has been authorized for detection and/or diagnosis of SARS-CoV-2 by FDA under an Emergency Use Authorization (EUA). This EUA will remain  in effect (meaning this test can be used) for the duration of the COVID-19 declaration under Section 564(b)(1) of the Act,  21 U.S.C.section 360bbb-3(b)(1), unless the authorization is terminated  or revoked sooner.       Influenza A by PCR NEGATIVE NEGATIVE Final   Influenza B by PCR NEGATIVE NEGATIVE Final    Comment: (NOTE) The Xpert Xpress SARS-CoV-2/FLU/RSV plus assay is intended as an aid in the diagnosis of influenza from Nasopharyngeal swab specimens and should not be used as a sole basis for treatment. Nasal washings and aspirates are unacceptable for Xpert Xpress SARS-CoV-2/FLU/RSV testing.  Fact Sheet for Patients: EntrepreneurPulse.com.au  Fact Sheet for Healthcare Providers: IncredibleEmployment.be  This test is not yet approved or cleared by the [place] FDA and has been authorized for detection and/or diagnosis of SARS-CoV-2 by FDA under an Emergency Use Authorization (EUA). This EUA will remain in effect (meaning this test can be used) for the duration of the COVID-19 declaration under Section 564(b)(1) of the Act, 21 U.S.C. section 360bbb-3(b)(1), unless the authorization is terminated or revoked.  Performed at [facility], [address], [place], [place] [address]       Studies: CT ABDOMEN PELVIS WO CONTRAST  Result Date: 06/25/2021 CLINICAL DATA:  Abdominal pain EXAM: CT ABDOMEN AND PELVIS WITHOUT CONTRAST TECHNIQUE: Multidetector CT imaging of the abdomen and pelvis was performed following the standard protocol without IV contrast. RADIATION DOSE REDUCTION: This exam was performed according to the departmental dose-optimization program which includes automated exposure control, adjustment of the mA and/or kV according to patient size and/or use of iterative reconstruction technique. COMPARISON:  12/24/2020 FINDINGS: Lower chest: Visualized lower lung fields are clear. There are pockets of air in the wall of the lower thoracic esophagus. Hepatobiliary: There is air in the lumen of intrahepatic and extrahepatic bile ducts. There is air in the  lumen of gallbladder. Pancreas: No focal abnormality is seen. Spleen: Unremarkable. Adrenals/Urinary Tract: Adrenals are not enlarged. There is possible minimal calcification in the right adrenal. There is no hydronephrosis. There are few small right renal stones each measuring less than 3 mm. Ureters are not dilated. Urinary bladder is unremarkable. Stomach/Bowel: Stomach is moderately distended with air-fluid level. There is no significant small bowel dilation. Appendix is not dilated. There is no significant wall thickening in the colon. There is no pericolic stranding or fluid collection. Vascular/Lymphatic: Scattered arterial calcifications are seen. Reproductive: Uterus is unremarkable. There are no dominant adnexal masses. Other: There is no ascites or pneumoperitoneum. Musculoskeletal: There is first-degree anterolisthesis at L4-L5 level. Spondylolysis is seen in the L5 vertebra. Spinal stenosis and encroachment of neural foramina is seen at multiple levels in the lumbar spine. IMPRESSION: There is no evidence of intestinal obstruction or pneumoperitoneum. Appendix is not dilated. There is no hydronephrosis. There is air in the lumen of bile ducts and gallbladder possibly suggesting previous sphincterotomy. There is no significant dilation  of bile ducts. There are few small right renal stones. Lumbar spondylosis with spinal stenosis and encroachment of neural foramina at multiple levels. Spondylolysis is seen in the L5 vertebra with first-degree spondylolisthesis at L5-S1 level. There are small pockets of air in the wall of the visualized lower thoracic esophagus close to the gastroesophageal junction. Significance of this pneumatosis is not clearly evident. This may be a benign process or suggest inflammatory or infectious process. Electronically Signed   By: Elmer Picker M.D.   On: 06/25/2021 15:41    Scheduled Meds:  enoxaparin (LOVENOX) injection  40 mg Subcutaneous Q24H   feeding supplement   237 mL Oral BID BM   pantoprazole  40 mg Oral BID   potassium chloride  40 mEq Oral BID   potassium chloride  40 mEq Oral BID    Continuous Infusions:  lactated ringers 100 mL/hr at 06/25/21 1946     LOS: 1 day     Kayleen Memos, MD [facility] Pager [phone]  If 7PM-7AM, please contact night-coverage [email or web address] Password [email or web address] 06/26/2021, 7:16 AM

## 2021-06-26 NOTE — TOC Progression Note (Signed)
Transition of Care [facility]) - Progression Note    Patient Details  Name: Kristina Cardenas MRN: [id number] Date of Birth: June 19, 1975  [facility] of [facility]) CM/SW Contact  Karn Cassis, [place] Phone Number: 06/26/2021, 10:42 AM  Clinical Narrative:  Transition of Care [facility]) Screening Note   Patient Details  Name: Kristina Cardenas Date of Birth: 03-May-1975   Transition of Care [facility]) CM/SW Contact:    Karn Cassis, LCSW Phone Number: 06/26/2021, 10:42 AM    Transition of Care Department [facility] has reviewed patient and no TOC needs have been identified at this time. We will continue to monitor patient advancement through interdisciplinary progression rounds. If new patient transition needs arise, please place a TOC consult.          Barriers to Discharge: Continued Medical Work up  Expected Discharge Plan and Services                                                 Social Determinants of Health (SDOH) Interventions    Readmission Risk Interventions No flowsheet data found.

## 2021-06-26 NOTE — Progress Notes (Addendum)
Overnight, A & O x4 patient was nauseated, and c/o pain of 7/10 in abdomen. Treated with PRN meds. Patient required 500 mL bolus NS X 2 for manual blood pressure of 82/60. Also, patient reported voiding X 3 in BR, but refused bladder scan to verify her bladder was emptying completely. Critical value potassium messaged to MD.

## 2021-06-26 NOTE — Progress Notes (Signed)
Patient is requesting that her pain medicine be increased  Patents blood pressure has been running low, last B/P checked was 87/61 pulse 61. 500NS bolus infusing at this time. MD aware of blood pressure and patients request to have pain medicine increased. No new orders at this time, waiting for MD to respond.

## 2021-06-26 NOTE — Progress Notes (Signed)
°   06/26/21 1002  Assess: MEWS Score  BP 90/63  Pulse Rate (!) 108  Assess: MEWS Score  MEWS Temp 0  MEWS Systolic 1  MEWS Pulse 1  MEWS RR 0  MEWS LOC 0  MEWS Score 2  MEWS Score Color Yellow  Assess: if the MEWS score is Yellow or Red  Were vital signs taken at a resting state? Yes  Focused Assessment No change from prior assessment  Early Detection of Sepsis Score *See Row Information* Low  MEWS guidelines implemented *See Row Information* Yes  Treat  MEWS Interventions Administered prn meds/treatments  Take Vital Signs  Increase Vital Sign Frequency  Yellow: Q 2hr X 2 then Q 4hr X 2, if remains yellow, continue Q 4hrs  Escalate  MEWS: Escalate Yellow: discuss with charge nurse/RN and consider discussing with provider and RRT  Notify: Charge Nurse/RN  Name of Charge Nurse/RN Notified Audrea Muscat RN  Date Charge Nurse/RN Notified 06/26/21  Time Charge Nurse/RN Notified 1002  Notify: Provider  Provider Name/Title Dr. hall  Date Provider Notified 06/26/21  Time Provider Notified 1010  Notification Type  (secure chat)  Notification Reason Other (Comment) (mews change)  Provider response See new orders  Date of Provider Response 06/26/21

## 2021-06-27 DIAGNOSIS — R1084 Generalized abdominal pain: Secondary | ICD-10-CM

## 2021-06-27 DIAGNOSIS — E871 Hypo-osmolality and hyponatremia: Principal | ICD-10-CM

## 2021-06-27 DIAGNOSIS — R112 Nausea with vomiting, unspecified: Secondary | ICD-10-CM

## 2021-06-27 LAB — RENAL FUNCTION PANEL
Albumin: 2.5 g/dL — ABNORMAL LOW (ref 3.5–5.0)
Anion gap: 6 (ref 5–15)
BUN: 11 mg/dL (ref 6–20)
CO2: 26 mmol/L (ref 22–32)
Calcium: 8.3 mg/dL — ABNORMAL LOW (ref 8.9–10.3)
Chloride: 106 mmol/L (ref 98–111)
Creatinine, Ser: 0.7 mg/dL (ref 0.44–1.00)
GFR, Estimated: >60 mL/min (ref >60)
Glucose, Bld: 78 mg/dL (ref 70–99)
Phosphorus: 1.2 mg/dL — ABNORMAL LOW (ref 2.5–4.6)
Potassium: 3.9 mmol/L (ref 3.5–5.1)
Sodium: 138 mmol/L (ref 135–145)

## 2021-06-27 LAB — MAGNESIUM: Magnesium: 2.1 mg/dL (ref 1.7–2.4)

## 2021-06-27 MED ORDER — ADULT MULTIVITAMIN W/MINERALS CH
1.0000 | ORAL_TABLET | Freq: Every day | ORAL | Status: DC
  Administered 2021-06-27: 1 via ORAL
  Filled 2021-06-27 (×2): qty 1

## 2021-06-27 MED ORDER — SODIUM PHOSPHATES 45 MMOLE/15ML IV SOLN
30.0000 mmol | INTRAVENOUS | Status: AC
  Administered 2021-06-27: 30 mmol via INTRAVENOUS
  Filled 2021-06-27: qty 10

## 2021-06-27 MED ORDER — OXYCODONE HCL 5 MG PO TABS
5.0000 mg | ORAL_TABLET | ORAL | Status: DC | PRN
  Administered 2021-06-27 – 2021-06-29 (×4): 5 mg via ORAL
  Filled 2021-06-27 (×5): qty 1

## 2021-06-27 MED ORDER — POTASSIUM & SODIUM PHOSPHATES 280-160-250 MG PO PACK
2.0000 | PACK | Freq: Three times a day (TID) | ORAL | Status: DC
  Administered 2021-06-27 – 2021-06-28 (×5): 2 via ORAL
  Filled 2021-06-27 (×3): qty 2
  Filled 2021-06-27 (×2): qty 1
  Filled 2021-06-27: qty 2

## 2021-06-27 MED ORDER — HYDROMORPHONE HCL 1 MG/ML IJ SOLN
1.0000 mg | INTRAMUSCULAR | Status: DC | PRN
  Administered 2021-06-27 – 2021-06-29 (×10): 1 mg via INTRAVENOUS
  Filled 2021-06-27 (×10): qty 1

## 2021-06-27 MED ORDER — PROMETHAZINE HCL 25 MG/ML IJ SOLN
INTRAMUSCULAR | Status: AC
  Filled 2021-06-27: qty 1

## 2021-06-27 MED ORDER — ENOXAPARIN SODIUM 40 MG/0.4ML IJ SOSY
40.0000 mg | PREFILLED_SYRINGE | INTRAMUSCULAR | Status: DC
  Administered 2021-06-29: 40 mg via SUBCUTANEOUS
  Filled 2021-06-27: qty 0.4

## 2021-06-27 NOTE — Consult Note (Signed)
Gastroenterology Consult   Referring Provider: No ref. provider found Primary Care Physician:  Pcp, No Primary Gastroenterologist:  Dr.  Patient ID: Kristina Cardenas; [id number]; 18-Apr-1976   Admit date: 06/25/2021  LOS: 2 days   Date of Consultation: 06/27/2021  Reason for Consultation:  refractory nausea, abdominal pain with history of gastric ulcers  History of Present Illness   Kristina Cardenas is a 46 y.o. year old female with a history of GERD, gastric ulcer with upper GI bleed, stroke, bell's palsy, chronic nausea, history of THC and cocaine use. She presented to the ED on 2/25 with 3-4 days of burning abdominal pain with intractable vomiting. CT A/P revealed air in the lumen of the bile ducts and gallbladder possibly suggesting sphincterotomy. No evidence of obstruction, No ductal dilation. Stomach moderately distended. She was started on IV fluid, electrolyte replacements, and antiemetics. GI consulted for persistent nausea and abdominal pain.   Per chart review patient has been seen multiple times over the last 2 years for abdominal pain and N/V along with electrolyte imbalances, possible drug OD, and medication refills. Chart review reveals history of positive drug screen for cocaine. Patient admitted to hospitality the use of goody powers 1 month ago.  Patient denied any use of NSAID products including goody powders to me. She reports she has been dealing with nausea for a long time and usually is able to manage it with her meds. She reports her nausea as intermittent and persistent and stated she vomited twice yesterday. She has continued to take her protonix twice a day as prescribed. When asked to describe her abdominal pain she could not give any descriptives other than that it just hurts and sometimes feels like pulsating pain. Denies use or herbals, dysphagia, melena, hematochezia, hematemesis. She states she is not much of a meat eater. She has not followed with a GI facility  in about 4-5 years. Her last endoscopy per care everywhere was in 2017 with 3 cm duodenal bulb with visible vessel treated with epinephrine and Ovesco clip, and edematous mucosa around the ulcer that almost closed off the entire lumen. She left AMA this visit. She has a history of leaving the hospital AMA.   Denies irregular bowel pattern.  She was scheduled for EGD with Dr. Boykin Peek in December however this was cancelled for unknown reason.   History of THC and cocaine, denies recent use.     Past Medical History:  Diagnosis Date   Bell's palsy    COVID    GERD (gastroesophageal reflux disease)    History of stomach ulcers    Stroke [facility])     Past Surgical History:  Procedure Laterality Date   ABDOMINAL SURGERY      Prior to Admission medications   Medication Sig Start Date End Date Taking? Authorizing Provider  acetaminophen (TYLENOL) 500 MG tablet Take 1,500-2,000 mg by mouth every 6 (six) hours as needed.   Yes [provider]  pantoprazole (PROTONIX) 40 MG tablet Take 1 tablet (40 mg total) by mouth 2 (two) times daily. 04/03/21  Yes Haskel Schroeder, PA-C    Current Facility-Administered Medications  Medication Dose Route Frequency Provider Last Rate Last Admin   dicyclomine (BENTYL) capsule 10 mg  10 mg Oral Q6H PRN Levie Heritage, DO   10 mg at 06/27/21 0821   enoxaparin (LOVENOX) injection 40 mg  40 mg Subcutaneous Q24H Levie Heritage, DO   40 mg at 06/27/21 1840   feeding supplement (ENSURE ENLIVE /  ENSURE PLUS) liquid 237 mL  237 mL Oral BID BM Stinson, Jacob J, DO       HYDROmorphone (DILAUDID) injection 1 mg  1 mg Intravenous Q4H PRN Dow Adolph N, DO   1 mg at 06/27/21 1242   lactated ringers 1,000 mL with potassium chloride 40 mEq infusion   Intravenous Continuous Darlin Drop, DO 50 mL/hr at 06/27/21 1141 Rate Change at 06/27/21 1141   multivitamin with minerals tablet 1 tablet  1 tablet Oral Daily Dow Adolph N, DO       ondansetron [facility])  injection 4 mg  4 mg Intravenous Q6H PRN Levie Heritage, DO   4 mg at 06/27/21 1227   oxyCODONE (Oxy IR/ROXICODONE) immediate release tablet 5 mg  5 mg Oral Q6H PRN Dow Adolph N, DO   5 mg at 06/27/21 1157   pantoprazole (PROTONIX) injection 40 mg  40 mg Intravenous BID Dow Adolph N, DO   40 mg at 06/27/21 1018   potassium & sodium phosphates (PHOS-NAK) 280-160-250 MG packet 2 packet  2 packet Oral TID WC & HS Dow Adolph N, DO   2 packet at 06/27/21 1157   promethazine (PHENERGAN) 12.5 mg in sodium chloride 0.9 % 50 mL IVPB  12.5 mg Intravenous Q6H PRN Dow Adolph N, DO   Stopped at 06/26/21 1508   sodium phosphate 30 mmol in dextrose 5 % 250 mL infusion  30 mmol Intravenous STAT Darlin Drop, DO 43 mL/hr at 06/27/21 1131 30 mmol at 06/27/21 1131    Allergies as of 06/25/2021 - Review Complete 06/25/2021  Allergen Reaction Noted   Sulfa antibiotics  04/02/2020   Toradol [ketorolac tromethamine]  04/02/2020    History reviewed. No pertinent family history.  Social History   Socioeconomic History   Marital status: Divorced    Spouse name: Not on file   Number of children: Not on file   Years of education: Not on file   Highest education level: Not on file  Occupational History   Not on file  Tobacco Use   Smoking status: Every Day    Packs/day: 1.00    Types: Cigarettes   Smokeless tobacco: Never  Vaping Use   Vaping Use: Former  Substance and Sexual Activity   Alcohol use: Not Currently   Drug use: Not Currently   Sexual activity: Not Currently  Other Topics Concern   Not on file  Social History Narrative   Not on file   Social Determinants of Health   Financial Resource Strain: Not on file  Food Insecurity: Not on file  Transportation Needs: Not on file  Physical Activity: Not on file  Stress: Not on file  Social Connections: Not on file  Intimate Partner Violence: Not on file     Review of Systems   Gen: Denies any fever, chills, loss of appetite,  change in weight or weight loss CV: Denies chest pain, heart palpitations, syncope, edema  Resp: Denies shortness of breath with rest, cough, wheezing, coughing up blood, and pleurisy. GI: see HPI GU : Denies urinary burning, blood in urine, urinary frequency, and urinary incontinence. MS: Denies joint pain, limitation of movement, swelling, cramps, and atrophy.  Derm: Denies rash, itching, dry skin, hives. Psych: Denies depression, anxiety, memory loss, hallucinations, and confusion. Heme: Denies bruising or bleeding Neuro:  Denies any headaches, dizziness, paresthesias, shaking  Physical Exam   Vital Signs in last 24 hours: Temp:  [98.4 F (36.9 C)-99 F (37.2 C)] 98.4 F (  36.9 C) (02/27 0501) Pulse Rate:  [62-70] 70 (02/27 0501) Resp:  [18-19] 19 (02/27 0501) BP: (96-110)/(64-85) 110/85 (02/27 0501) SpO2:  [100 %] 100 % (02/27 0501) Last BM Date :  (patient states a few days ago not sure of date)  General:   Alert, cooperative. Thin and ill-appearing. Malnourished Head:  Normocephalic and atraumatic. Eyes:  Sclera clear, no icterus. Ears:  Normal auditory acuity. Neck:  Supple; no masses Lungs:  Clear throughout to auscultation.   No wheezes, crackles, or rhonchi. No acute distress. Heart:  Regular rate and rhythm; no murmurs, clicks, rubs,  or gallops. Abdomen:  Soft, mild TTP to epigastric region. Midline scar from prior unknown surgery. Large soft area midline abdomen that protrudes with coughing or any abdominal pressure. No hepatosplenomegaly. Normal bowel sounds, without guarding, and without rebound.   Rectal: deferred  Msk:  Symmetrical without gross deformities. Slightly muscle atrophy Extremities:  Without clubbing or edema. Neurologic:  Alert and  oriented x4. Skin:  Intact without significant lesions or rashes. Psych:  Alert. Normal mood. Flat affect  Intake/Output from previous day: 02/26 0701 - 02/27 0700 In: 2145.7 [P.O.:1200; I.V.:509.6; IV  Piggyback:436.1] Out: -  Intake/Output this shift: No intake/output data recorded.   Labs/Studies   Recent Labs Recent Labs    06/25/21 1157 06/26/21 0453  WBC 8.1 4.8  HGB 15.8* 12.0  HCT 46.2* 38.2  PLT 300 171   BMET Recent Labs    06/25/21 1901 06/26/21 0453 06/27/21 0426  NA 129* 133* 138  K 2.3* 2.7* 3.9  CL 77* 92* 106  CO2 32 35* 26  GLUCOSE 89 86 78  BUN 35* 20 11  CREATININE 2.15* 1.00 0.70  CALCIUM 9.0 8.1* 8.3*   LFT Recent Labs    06/25/21 1157 06/27/21 0426  PROT 8.0  --   ALBUMIN 4.2 2.5*  AST 21  --   ALT 14  --   ALKPHOS 84  --   BILITOT 0.6  --    PT/INR No results for input(s): LABPROT, INR in the last 72 hours. Hepatitis Panel No results for input(s): HEPBSAG, HCVAB, HEPAIGM, HEPBIGM in the last 72 hours. C-Diff No results for input(s): CDIFFTOX in the last 72 hours.  Radiology/Studies CT ABDOMEN PELVIS WO CONTRAST  Result Date: 06/25/2021 CLINICAL DATA:  Abdominal pain EXAM: CT ABDOMEN AND PELVIS WITHOUT CONTRAST TECHNIQUE: Multidetector CT imaging of the abdomen and pelvis was performed following the standard protocol without IV contrast. RADIATION DOSE REDUCTION: This exam was performed according to the departmental dose-optimization program which includes automated exposure control, adjustment of the mA and/or kV according to patient size and/or use of iterative reconstruction technique. COMPARISON:  12/24/2020 FINDINGS: Lower chest: Visualized lower lung fields are clear. There are pockets of air in the wall of the lower thoracic esophagus. Hepatobiliary: There is air in the lumen of intrahepatic and extrahepatic bile ducts. There is air in the lumen of gallbladder. Pancreas: No focal abnormality is seen. Spleen: Unremarkable. Adrenals/Urinary Tract: Adrenals are not enlarged. There is possible minimal calcification in the right adrenal. There is no hydronephrosis. There are few small right renal stones each measuring less than 3 mm.  Ureters are not dilated. Urinary bladder is unremarkable. Stomach/Bowel: Stomach is moderately distended with air-fluid level. There is no significant small bowel dilation. Appendix is not dilated. There is no significant wall thickening in the colon. There is no pericolic stranding or fluid collection. Vascular/Lymphatic: Scattered arterial calcifications are seen. Reproductive: Uterus is unremarkable. There  are no dominant adnexal masses. Other: There is no ascites or pneumoperitoneum. Musculoskeletal: There is first-degree anterolisthesis at L4-L5 level. Spondylolysis is seen in the L5 vertebra. Spinal stenosis and encroachment of neural foramina is seen at multiple levels in the lumbar spine. IMPRESSION: There is no evidence of intestinal obstruction or pneumoperitoneum. Appendix is not dilated. There is no hydronephrosis. There is air in the lumen of bile ducts and gallbladder possibly suggesting previous sphincterotomy. There is no significant dilation of bile ducts. There are few small right renal stones. Lumbar spondylosis with spinal stenosis and encroachment of neural foramina at multiple levels. Spondylolysis is seen in the L5 vertebra with first-degree spondylolisthesis at L5-S1 level. There are small pockets of air in the wall of the visualized lower thoracic esophagus close to the gastroesophageal junction. Significance of this pneumatosis is not clearly evident. This may be a benign process or suggest inflammatory or infectious process. Electronically Signed   By: Ernie AvenaPalani  Rathinasamy M.D.   On: 06/25/2021 15:41     Assessment   Kristina SchmidtKimberley Cardenas is a 46 y.o. year old female with a history of GERD, gastric ulcers with upper GI bleed, stroke, bell's palsy, chronic nausea, history of THC and cocaine use. She presents with chronic nausea and abdominal pain.   Chronic Nausea/vomiting: Evidence of malnutrition, electrolyte imbalances. Has had poor appetite. Dietician following. She states she is able  to tolerate her chronic nausea most of the time with her meds. Has history of THC use, this could be a contributing factor but unclear at this time. Would continue prn antiemetics and bentyl.   Abdominal pain: Complains of epigastric pain, description unclear. No complaints of radiation. No evidence of obstruction or abdominal etiology present on CT scan. Can continue Bentyl. History of surgical intervention unclear, patient unsure of time frame and unable to find in care everywhere.  History of Gastric Ulcer with perforation: gastric ulcer with bleeding in 2018 s/p epinephrine and clip. Has not had much follow up since this per patient and chart review. She has not seen anyone since moving from [place]. Initially upon seeing the patient she declined EGD work up, then was agreeable to procedure after speaking with Dr. Marletta Lorarver. Will be made NPO at midnight.  No recent use of illegal substances.   Patient has utilized the ER for medication refills and any sort of medical issue that arises. She does not have a primary GI physician or PCP. Should be referred upon discharge.    Plan / Recommendations   Continue protonix 40 mg BID, antiemetics and bentyl as needed.  Avoid NSAIDs of all kinds.  Hold lovenox tomorrow morning, will restart 3/1 AM. NPO at midnight Proceed with upper endoscopy by Dr. Levon Hedgerastaneda in near future: the risks, benefits, and alternatives have been discussed with the patient in detail. The patient states understanding and desires to proceed.      06/27/2021, 2:00 PM  Brooke Bonitoourtney Francee Setzer, MSN, FNP-BC, AGACNP-BC [facility]

## 2021-06-27 NOTE — Progress Notes (Signed)
Gave patient PRN pain pill oxycodone immediate release 5mg  PO  at 1225 patient called stating she threw up, patient did throw up in bathroom. MD aware. MD placed order for prn dilaudid 1mg  IV. MD stated to give IV dilaudid now. Will give IV dilaudid when able to pull from pyxis.

## 2021-06-27 NOTE — Progress Notes (Signed)
PROGRESS NOTE  Kristina Cardenas LTJ:[id number] DOB: 02-03-1976 DOA: 06/25/2021 PCP: Pcp, No  HPI/Recap of past 24 hours: Kristina Cardenas is a 46 y.o. female with medical history significant of GERD, gastric ulcer with upper GI bleed, Bell's palsy, history of stroke, THC use disorder.  Patient presents with 3 to 4-day history of burning abdominal pain with intractable vomiting over the past week.  Work-up revealed hypovolemic hyponatremia likely from GI losses with vomiting.  Started on IV fluid hydration, electrolyte replacement and IV antiemetics.  IV Protonix 40 mg twice daily started on 06/26/2021.  Due to persistent symptomatology GI was consulted.  06/27/2021: Patient was seen and examined at bedside.  Reports her nausea is persistent and intermittent.  Admits to the use of Goody powder about a month ago.  Assessment/Plan: Principal Problem:   Acute hyponatremia Active Problems:   Acute hypokalemia   Nausea and vomiting   Dehydration   Hyponatremia   Resolved hypovolemic hyponatremia likely secondary to poor oral intake from nausea and vomiting. Presented with serum sodium of 126, currently on LR KCL IV fluid hydration at 100 cc/h.   Serum sodium improved to 138 from 133 this morning. Continue to encourage oral intake as tolerated with the help of IV antiemetics.  Intractable nausea and vomiting with prior history of gastric ulcer. Negative pregnancy test 06/25/2021 Continue IV Protonix 40 mg twice daily. IV antiemetics as needed Advance diet as tolerated. GI consulted to assist with the management.  Severe hypophosphatemia secondary to poor oral intake. Serum phosphorus 1.2 Repleted intravenously and orally Repeat serum phosphorus level in the morning.  Resolved post repletion: Refractory severe hypokalemia Presented with serum potassium 2.2>> 3.9 Currently on LR KCl 40 mEq at 100 cc/h, reduce rate to 50 cc/h. Magnesium repleted 2.1 from 1.7.  History of GERD and  gastric ulcers Home p.o. Protonix on hold due to intractable nausea and vomiting. Continue IV Protonix 40 mg twice daily Advised to completely abstain from Alvo powders and other NSAIDs.  Resolved AKI likely prerenal in the setting of dehydration and poor oral intake Baseline creatinine appears to be 0.7 with GFR greater than 60 Renal function is back to baseline. Continue to avoid nephrotoxic agents, dehydration and hypotension.  Avoid NSAIDs.    Critical care time: 65 minutes.   Code Status: Full code  Family Communication: Significant other at bedside.  Disposition Plan: Likely will discharge to home once symptomatology has improved, is hemodynamically stable and is able to tolerate a soft diet.   Consultants: GI.  Procedures: None  Antimicrobials: None  DVT prophylaxis: Subcu Lovenox daily.  Status is: Inpatient Patient requires at least 2 midnights for further evaluation and treatment of present condition.            Objective: Vitals:   06/26/21 1357 06/26/21 1741 06/26/21 2005 06/27/21 0501  BP: (!) 98/53 96/64 104/67 110/85  Pulse:  69 62 70  Resp:  18 18 19   Temp:  99 F (37.2 C) 98.9 F (37.2 C) 98.4 F (36.9 C)  TempSrc:   Oral Oral  SpO2:  100% 100% 100%  Weight:      Height:        Intake/Output Summary (Last 24 hours) at 06/27/2021 1026 Last data filed at 06/26/2021 2005 Gross per 24 hour  Intake 2145.71 ml  Output --  Net 2145.71 ml   Filed Weights   06/25/21 1119  Weight: 57.2 kg    Exam:  General: 46 y.o. year-old female frail-appearing in no  acute stress.  She is alert and oriented x3.   Cardiovascular: Regular rate and rhythm no rubs or gallops. Respiratory: \Clear to auscultation no wheeze or rales.  Abdomen: Soft diffusely tender with palpation.  Abdominal scar noted. Musculoskeletal: No lower extremity edema bilaterally.   Skin: No ulcerative lesions noted. Psychiatry: Mood is appropriate for condition and  setting. Neuro: Moves all 4 extremities.  Nonfocal exam.   Data Reviewed: CBC: Recent Labs  Lab 06/25/21 1157 06/26/21 0453  WBC 8.1 4.8  HGB 15.8* 12.0  HCT 46.2* 38.2  MCV 89.7 93.6  PLT 300 171   Basic Metabolic Panel: Recent Labs  Lab 06/25/21 1157 06/25/21 1901 06/26/21 0453 06/27/21 0426 06/27/21 0524  NA 126* 129* 133* 138  --   K 2.2* 2.3* 2.7* 3.9  --   CL 77* 77* 92* 106  --   CO2 36* 32 35* 26  --   GLUCOSE 96 89 86 78  --   BUN 35* 35* 20 11  --   CREATININE 2.02* 2.15* 1.00 0.70  --   CALCIUM 8.7* 9.0 8.1* 8.3*  --   MG  --  1.9 1.7  --  2.1  PHOS  --   --   --  1.2*  --    GFR: Estimated Creatinine Clearance: 79.3 mL/min (by C-G formula based on SCr of 0.7 mg/dL). Liver Function Tests: Recent Labs  Lab 06/25/21 1157 06/27/21 0426  AST 21  --   ALT 14  --   ALKPHOS 84  --   BILITOT 0.6  --   PROT 8.0  --   ALBUMIN 4.2 2.5*   Recent Labs  Lab 06/25/21 1157  LIPASE 36   No results for input(s): AMMONIA in the last 168 hours. Coagulation Profile: No results for input(s): INR, PROTIME in the last 168 hours. Cardiac Enzymes: No results for input(s): CKTOTAL, CKMB, CKMBINDEX, TROPONINI in the last 168 hours. BNP (last 3 results) No results for input(s): PROBNP in the last 8760 hours. HbA1C: No results for input(s): HGBA1C in the last 72 hours. CBG: No results for input(s): GLUCAP in the last 168 hours. Lipid Profile: No results for input(s): CHOL, HDL, LDLCALC, TRIG, CHOLHDL, LDLDIRECT in the last 72 hours. Thyroid Function Tests: No results for input(s): TSH, T4TOTAL, FREET4, T3FREE, THYROIDAB in the last 72 hours. Anemia Panel: No results for input(s): VITAMINB12, FOLATE, FERRITIN, TIBC, IRON, RETICCTPCT in the last 72 hours. Urine analysis:    Component Value Date/Time   COLORURINE YELLOW 12/25/2020 0951   APPEARANCEUR CLOUDY (A) 12/25/2020 0951   LABSPEC 1.035 (H) 12/25/2020 0951   PHURINE 7.0 12/25/2020 0951   GLUCOSEU NEGATIVE  12/25/2020 0951   HGBUR MODERATE (A) 12/25/2020 0951   BILIRUBINUR NEGATIVE 12/25/2020 0951   KETONESUR 20 (A) 12/25/2020 0951   PROTEINUR 30 (A) 12/25/2020 0951   NITRITE NEGATIVE 12/25/2020 0951   LEUKOCYTESUR LARGE (A) 12/25/2020 0951   Sepsis Labs: @LABRCNTIP (procalcitonin:4,lacticidven:4)  ) Recent Results (from the past 240 hour(s))  Resp Panel by RT-PCR (Flu A&B, Covid) Nasopharyngeal Swab     Status: None   Collection Time: 06/25/21  5:40 PM   Specimen: Nasopharyngeal Swab; Nasopharyngeal(NP) swabs in vial transport medium  Result Value Ref Range Status   SARS Coronavirus 2 by RT PCR NEGATIVE NEGATIVE Final    Comment: (NOTE) SARS-CoV-2 target nucleic acids are NOT DETECTED.  The SARS-CoV-2 RNA is generally detectable in upper respiratory specimens during the acute phase of infection. The lowest concentration of SARS-CoV-2  viral copies this assay can detect is 138 copies/mL. A negative result does not preclude SARS-Cov-2 infection and should not be used as the sole basis for treatment or other patient management decisions. A negative result may occur with  improper specimen collection/handling, submission of specimen other than nasopharyngeal swab, presence of viral mutation(s) within the areas targeted by this assay, and inadequate number of viral copies(<138 copies/mL). A negative result must be combined with clinical observations, patient history, and epidemiological information. The expected result is Negative.  Fact Sheet for Patients:  BloggerCourse.com  Fact Sheet for Healthcare Providers:  SeriousBroker.it  This test is no t yet approved or cleared by the [place] FDA and  has been authorized for detection and/or diagnosis of SARS-CoV-2 by FDA under an Emergency Use Authorization (EUA). This EUA will remain  in effect (meaning this test can be used) for the duration of the COVID-19 declaration under  Section 564(b)(1) of the Act, 21 U.S.C.section 360bbb-3(b)(1), unless the authorization is terminated  or revoked sooner.       Influenza A by PCR NEGATIVE NEGATIVE Final   Influenza B by PCR NEGATIVE NEGATIVE Final    Comment: (NOTE) The Xpert Xpress SARS-CoV-2/FLU/RSV plus assay is intended as an aid in the diagnosis of influenza from Nasopharyngeal swab specimens and should not be used as a sole basis for treatment. Nasal washings and aspirates are unacceptable for Xpert Xpress SARS-CoV-2/FLU/RSV testing.  Fact Sheet for Patients: BloggerCourse.com  Fact Sheet for Healthcare Providers: SeriousBroker.it  This test is not yet approved or cleared by the [place] FDA and has been authorized for detection and/or diagnosis of SARS-CoV-2 by FDA under an Emergency Use Authorization (EUA). This EUA will remain in effect (meaning this test can be used) for the duration of the COVID-19 declaration under Section 564(b)(1) of the Act, 21 U.S.C. section 360bbb-3(b)(1), unless the authorization is terminated or revoked.  Performed at [facility], [address] [place], [place] [address]       Studies: No results found.  Scheduled Meds:  enoxaparin (LOVENOX) injection  40 mg Subcutaneous Q24H   feeding supplement  237 mL Oral BID BM   pantoprazole (PROTONIX) IV  40 mg Intravenous BID   potassium & sodium phosphates  2 packet Oral TID WC & HS    Continuous Infusions:  lactated ringers with kcl 100 mL/hr at 06/27/21 0203   promethazine (PHENERGAN) injection (IM or IVPB) Stopped (06/26/21 1508)   sodium phosphate  Dextrose 5% IVPB       LOS: 2 days     Darlin Drop, MD [facility] Pager [phone]  If 7PM-7AM, please contact night-coverage [email or web address] Password [facility] 06/27/2021, 10:26 AM

## 2021-06-27 NOTE — Progress Notes (Signed)
Initial Nutrition Assessment  DOCUMENTATION CODES:   Severe malnutrition in context of chronic illness  INTERVENTION:  - Encourage adequate PO intake - Encourage pt to eat small meals more frequently throughout the day in addition to eating after taking antiemetics - Ensure Enlive po BID, each supplement provides 350 kcal and 20 grams of protein. - MVI with minerals daily  NUTRITION DIAGNOSIS:   Severe Malnutrition related to chronic illness (GERD, stomach ulcers, stroke, substance abuse) as evidenced by moderate fat depletion, severe muscle depletion, moderate muscle depletion, percent weight loss.  GOAL:   Patient will meet greater than or equal to 90% of their needs  MONITOR:   PO intake, Supplement acceptance, Diet advancement, Labs, Weight trends  REASON FOR ASSESSMENT:   Malnutrition Screening Tool    ASSESSMENT:   Pt with abdominal pain, intractable nausea and vomiting. PMH includes GERD, stomach ulcers, Bell's palsy, stroke, and substance abuse.  Pt reports having a poor appetite. She tries to eat despite knowing she will vomit afterwards. She endorses ongoing abdominal pain for multiple years and n/v for the past 2-3 weeks. Within the last week has had very minimal PO intake.   2 meal completions (2/26) documented of 100%- however, pt states that she is unable to keep the food she is eating down.  She reports a usual weight of around 161 lbs and endorses weight loss within the last 2.5 weeks with a current weight of 127 lbs. Unable to confirm this weight loss, however per review of chart, pt noted to have had an 18% weight loss within the last 6 months which is significant for time frame.   Medications: protonix, PHOS-NAK IV drips: LR with KCl, NaPhos in D5  Labs: reviewed  NUTRITION - FOCUSED PHYSICAL EXAM:  Flowsheet Row Most Recent Value  Orbital Region Severe depletion  Upper Arm Region Moderate depletion  Thoracic and Lumbar Region Moderate depletion   Buccal Region Moderate depletion  Temple Region Moderate depletion  Clavicle Bone Region Severe depletion  Clavicle and Acromion Bone Region Moderate depletion  Scapular Bone Region Moderate depletion  Dorsal Hand Severe depletion  Patellar Region Severe depletion  Anterior Thigh Region Severe depletion  Posterior Calf Region Moderate depletion  Edema (RD Assessment) None  Hair Reviewed  Eyes Reviewed  Mouth Reviewed  Skin Reviewed  Nails Reviewed       Diet Order:   Diet Order             DIET DYS 3 Room service appropriate? Yes; Fluid consistency: Thin  Diet effective now                   EDUCATION NEEDS:   Education needs have been addressed  Skin:  Skin Assessment: Reviewed RN Assessment  Last BM:  PTA  Height:   Ht Readings from Last 1 Encounters:  06/25/21 5\' 8"  (1.727 m)    Weight:   Wt Readings from Last 1 Encounters:  06/25/21 57.2 kg   BMI:  Body mass index is 19.17 kg/m.  Estimated Nutritional Needs:   Kcal:  1750-1950  Protein:  85-90g  Fluid:  >/=1.8L  06/27/21, RDN, LDN Clinical Nutrition

## 2021-06-28 ENCOUNTER — Inpatient Hospital Stay (HOSPITAL_COMMUNITY): Payer: Medicaid Other | Admitting: Anesthesiology

## 2021-06-28 ENCOUNTER — Encounter (HOSPITAL_COMMUNITY)
Admission: EM | Disposition: A | Discharge status: Home / Self Care | Payer: Self-pay | Source: Home / Self Care | Attending: Internal Medicine

## 2021-06-28 ENCOUNTER — Encounter (HOSPITAL_COMMUNITY): Payer: Self-pay | Admitting: Internal Medicine

## 2021-06-28 ENCOUNTER — Inpatient Hospital Stay (HOSPITAL_COMMUNITY): Payer: Medicaid Other

## 2021-06-28 DIAGNOSIS — K259 Gastric ulcer, unspecified as acute or chronic, without hemorrhage or perforation: Secondary | ICD-10-CM

## 2021-06-28 DIAGNOSIS — R1013 Epigastric pain: Secondary | ICD-10-CM

## 2021-06-28 DIAGNOSIS — R112 Nausea with vomiting, unspecified: Secondary | ICD-10-CM

## 2021-06-28 DIAGNOSIS — K311 Adult hypertrophic pyloric stenosis: Secondary | ICD-10-CM

## 2021-06-28 DIAGNOSIS — K3189 Other diseases of stomach and duodenum: Secondary | ICD-10-CM

## 2021-06-28 DIAGNOSIS — K312 Hourglass stricture and stenosis of stomach: Secondary | ICD-10-CM

## 2021-06-28 DIAGNOSIS — K2100 Gastro-esophageal reflux disease with esophagitis, without bleeding: Secondary | ICD-10-CM

## 2021-06-28 HISTORY — PX: ESOPHAGOGASTRODUODENOSCOPY (EGD) WITH PROPOFOL: SHX5813

## 2021-06-28 HISTORY — PX: BIOPSY: SHX5522

## 2021-06-28 LAB — CBC
HCT: 37.6 % (ref 36.0–46.0)
Hemoglobin: 11.8 g/dL — ABNORMAL LOW (ref 12.0–15.0)
MCH: 29.7 pg (ref 26.0–34.0)
MCHC: 31.4 g/dL (ref 30.0–36.0)
MCV: 94.7 fL (ref 80.0–100.0)
Platelets: 167 10*3/uL (ref 150–400)
RBC: 3.97 MIL/uL (ref 3.87–5.11)
RDW: 13.3 % (ref 11.5–15.5)
WBC: 5.3 10*3/uL (ref 4.0–10.5)
nRBC: 0 % (ref 0.0–0.2)

## 2021-06-28 LAB — BASIC METABOLIC PANEL
Anion gap: 8 (ref 5–15)
BUN: 12 mg/dL (ref 6–20)
CO2: 24 mmol/L (ref 22–32)
Calcium: 8.3 mg/dL — ABNORMAL LOW (ref 8.9–10.3)
Chloride: 105 mmol/L (ref 98–111)
Creatinine, Ser: 0.59 mg/dL (ref 0.44–1.00)
GFR, Estimated: >60 mL/min (ref >60)
Glucose, Bld: 84 mg/dL (ref 70–99)
Potassium: 3.8 mmol/L (ref 3.5–5.1)
Sodium: 137 mmol/L (ref 135–145)

## 2021-06-28 LAB — PHOSPHORUS: Phosphorus: 2.4 mg/dL — ABNORMAL LOW (ref 2.5–4.6)

## 2021-06-28 SURGERY — ESOPHAGOGASTRODUODENOSCOPY (EGD) WITH PROPOFOL
Anesthesia: General

## 2021-06-28 MED ORDER — LIDOCAINE HCL (CARDIAC) PF 100 MG/5ML IV SOSY
PREFILLED_SYRINGE | INTRAVENOUS | Status: DC | PRN
  Administered 2021-06-28: 50 mg via INTRAVENOUS

## 2021-06-28 MED ORDER — IOHEXOL 300 MG/ML  SOLN
100.0000 mL | Freq: Once | INTRAMUSCULAR | Status: AC | PRN
  Administered 2021-06-28: 100 mL via INTRAVENOUS

## 2021-06-28 MED ORDER — FENTANYL CITRATE (PF) 100 MCG/2ML IJ SOLN: 50.0000 ug | Freq: Once | INTRAMUSCULAR | Status: DC | PRN

## 2021-06-28 MED ORDER — FENTANYL CITRATE (PF) 100 MCG/2ML IJ SOLN: 50.0000 ug | Freq: Once | INTRAMUSCULAR | Status: AC

## 2021-06-28 MED ORDER — LACTATED RINGERS IV SOLN: INTRAVENOUS | Status: DC

## 2021-06-28 MED ORDER — GLUCAGON HCL RDNA (DIAGNOSTIC) 1 MG IJ SOLR
INTRAMUSCULAR | Status: AC
  Filled 2021-06-28: qty 1

## 2021-06-28 MED ORDER — SODIUM CHLORIDE 0.9 % IV SOLN: INTRAVENOUS | Status: DC

## 2021-06-28 MED ORDER — FENTANYL CITRATE PF 50 MCG/ML IJ SOSY
PREFILLED_SYRINGE | INTRAMUSCULAR | Status: AC
  Administered 2021-06-28: 50 ug
  Filled 2021-06-28: qty 1

## 2021-06-28 MED ORDER — PROPOFOL 10 MG/ML IV BOLUS
INTRAVENOUS | Status: DC | PRN
  Administered 2021-06-28: 50 mg via INTRAVENOUS

## 2021-06-28 MED ORDER — GLUCAGON HCL RDNA (DIAGNOSTIC) 1 MG IJ SOLR
INTRAMUSCULAR | Status: DC | PRN
  Administered 2021-06-28: .25 mg via INTRAVENOUS

## 2021-06-28 MED ORDER — ONDANSETRON HCL 4 MG/2ML IJ SOLN
4.0000 mg | Freq: Once | INTRAMUSCULAR | Status: AC
  Administered 2021-06-28: 4 mg via INTRAVENOUS

## 2021-06-28 MED ORDER — PROPOFOL 500 MG/50ML IV EMUL
INTRAVENOUS | Status: DC | PRN
  Administered 2021-06-28: 150 ug/kg/min via INTRAVENOUS

## 2021-06-28 NOTE — Op Note (Addendum)
[facility] Patient Name: Kristina Cardenas Procedure Date: 06/28/2021 1:12 PM MRN: [id number] Date of Birth: 05/02/1975 Attending MD: Katrinka Blazing ,  CSN: [id number] Age: 46 Admit Type: Inpatient Procedure:                Upper GI endoscopy Indications:              Epigastric abdominal pain, Nausea with vomiting Providers:                Katrinka Blazing, Enzo Montgomery RN, RN, Buel Ream.                            Thomasena Edis RN, RN, Pandora Leiter Tech., Technician Referring MD:              Medicines:                Monitored Anesthesia Care Complications:            No immediate complications. Estimated Blood Loss:     Estimated blood loss: none. Estimated blood loss:                            none. Procedure:                Pre-Anesthesia Assessment:                           - Prior to the procedure, a History and Physical                            was performed, and patient medications, allergies                            and sensitivities were reviewed. The patient's                            tolerance of previous anesthesia was reviewed.                           - The risks and benefits of the procedure and the                            sedation options and risks were discussed with the                            patient. All questions were answered and informed                            consent was obtained.                           - ASA Grade Assessment: III - A patient with severe                            systemic disease.                           After obtaining informed  consent, the endoscope was                            passed under direct vision. Throughout the                            procedure, the patient's blood pressure, pulse, and                            oxygen saturations were monitored continuously. The                            [id number]-[id number] ([id number] scope was introduced through the                            mouth, and advanced to the  pylorus. The upper GI                            endoscopy was performed with difficulty due to                            narrowing. Successful completion of the procedure                            was aided by withdrawing the scope and replacing                            with the pediatric endoscope. The patient tolerated                            the procedure well. The GIF-[id number] ([id number]) scope                            was introduced through the mouth, and advanced to                            the second part of duodenum. Scope In: 1:24:51 PM Scope Out: 1:52:56 PM Total Procedure Duration: 0 hours 28 minutes 5 seconds  Findings:      LA Grade C (one or more mucosal breaks continuous between tops of 2 or       more mucosal folds, less than 75% circumference) esophagitis with no       bleeding was found in the lower third of the esophagus.      One non-bleeding cratered gastric ulcer with a clean ulcer base (Forrest       Class III) was found at the pylorus. The lesion was 10 mm in largest       dimension. This ulcer was just proximal to the pyloric       deformity/stenosis. Biopsies were taken with a cold forceps for       Helicobacter pylori testing.      A benign-appearing, intrinsic severe stenosis was found in the       prepyloric region of the stomach. This was traversed after downsizing       the scope to a pediatric ultra slim  scope given severe deformity and       narrowing of the pylorus.      The examined duodenum was normal. Impression:               - LA Grade C esophagitis with no bleeding.                           - Non-bleeding gastric ulcer with a clean ulcer                            base (Forrest Class III). Biopsied.                           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                           - Normal examined duodenum. Moderate Sedation:      Per Anesthesia Care Recommendation:            - Return patient to hospital ward for ongoing care.                           - Full liquid diet.                           - Await pathology results.                           - Use Protonix (pantoprazole) 40 mg IV daily.                           - No ibuprofen, naproxen, or other non-steroidal                            anti-inflammatory drugs.                           - Consider surgical evaluation for possible                            antrectomy. Procedure Code(s):        --- Professional ---                           [phone], Esophagogastroduodenoscopy, flexible,                            transoral; with biopsy, single or multiple Diagnosis Code(s):        --- Professional ---                           K25.9, Gastric ulcer, unspecified as acute or  chronic, without hemorrhage or perforation                           K31.1, Adult hypertrophic pyloric stenosis                           R10.13, Epigastric pain                           R11.2, Nausea with vomiting, unspecified CPT copyright 2019 American Medical Association. All rights reserved. The codes documented in this report are preliminary and upon coder review may  be revised to meet current compliance requirements. Katrinka Blazing, MD Katrinka Blazing,  06/28/2021 2:03:03 PM This report has been signed electronically. Number of Addenda: 0

## 2021-06-28 NOTE — Progress Notes (Signed)
PROGRESS NOTE  Kristina Cardenas [id number] DOB: 12-15-1975 DOA: 06/25/2021 PCP: Pcp, No  HPI/Recap of past 24 hours: Kristina Cardenas is a 46 y.o. female with medical history significant of GERD, duodenal ulcer with upper GI bleed, Bell's palsy, history of stroke, THC use disorder, who presented with 3 to 4-days history of burning epigastric pain with intractable vomiting over the past week.  Frequent use of NSAIDs, Goody powder up until 1 month ago.  Work-up revealed hypovolemic hyponatremia likely from GI losses with vomiting.  Started on IV fluid hydration, electrolyte replacement and IV antiemetics.  IV Protonix 40 mg twice daily started on 06/26/2021.  Due to persistent symptomatology GI was consulted.  Plan for EGD on 06/28/2021.  06/28/2021: Patient was seen and examined at bedside.  She reports persistent abdominal pain, diffusely, 8 out of 10 prior to taking her pain medication.  Plan for EGD.  Assessment/Plan: Principal Problem:   Acute hyponatremia Active Problems:   Acute hypokalemia   Nausea and vomiting   Dehydration   Hyponatremia   Resolved hypovolemic hyponatremia likely secondary to poor oral intake from nausea and vomiting. Presented with serum sodium of 126, corrected with IV fluid. Serum sodium 137.  Continue LR at 50 cc/h x 2 days. Continue to encourage oral intake as tolerated with the help of IV antiemetics.  Intractable nausea and vomiting with prior history of gastric ulcer. Negative pregnancy test 06/25/2021 Continue IV Protonix 40 mg twice daily. IV antiemetics as needed Plan for EGD on 06/28/2021.  Severe hypophosphatemia secondary to poor oral intake. Serum phosphorus 1.2> 2.4 Repleted intravenously and orally Repeat serum phosphorus level in the morning, improved 2.4. Continue oral replacement.  Resolved post repletion: Refractory severe hypokalemia Presented with serum potassium 2.2>> 3.9, magnesium 2.1.  History of GERD and duodenal  ulcers Home p.o. Protonix on hold due to intractable nausea and vomiting. Continue IV Protonix 40 mg twice daily as recommended by GI. Advised to completely abstain from [place] powders and other NSAIDs.  She was receptive.  Resolved AKI likely prerenal in the setting of dehydration and poor oral intake Baseline creatinine appears to be 0.5 with GFR greater than 60 Renal function is back to baseline. Continue to avoid nephrotoxic agents, dehydration and hypotension.  Avoid NSAIDs.    Code Status: Full code  Family Communication: Significant other at bedside.  Disposition Plan: Likely will discharge to home once symptomatology has improved, and GI signs off.   Consultants: GI.  Procedures: None  Antimicrobials: None  DVT prophylaxis: Subcu Lovenox daily.  Status is: Inpatient Patient requires at least 2 midnights for further evaluation and treatment of present condition.            Objective: Vitals:   06/26/21 2005 06/27/21 0501 06/27/21 1404 06/27/21 2036  BP: 104/67 110/85 118/83 108/80  Pulse: 62 70 71 60  Resp: 18 19 20 16   Temp: 98.9 F (37.2 C) 98.4 F (36.9 C) 99.2 F (37.3 C) 98.6 F (37 C)  TempSrc: Oral Oral    SpO2: 100% 100% 100% 100%  Weight:      Height:        Intake/Output Summary (Last 24 hours) at 06/28/2021 0844 Last data filed at 06/27/2021 1500 Gross per 24 hour  Intake 1210.04 ml  Output --  Net 1210.04 ml   Filed Weights   06/25/21 1119  Weight: 57.2 kg    Exam:  General: 46 y.o. year-old female frail-appearing no acute stress.  She is alert and oriented x3.  Cardiovascular: Regular rate and rhythm no rubs or gallops.   Respiratory: Clear to auscultation no wheeze or rales  abdomen: Soft diffusely tender with palpation.  Bowel sounds present  musculoskeletal: Trace lower extremity edema bilaterally. Skin: No ulcerative lesions noted. Psychiatry: Mood is appropriate for condition Neuro: Moves all 4 extremities.   Nonfocal exam.   Data Reviewed: CBC: Recent Labs  Lab 06/25/21 1157 06/26/21 0453 06/28/21 0501  WBC 8.1 4.8 5.3  HGB 15.8* 12.0 11.8*  HCT 46.2* 38.2 37.6  MCV 89.7 93.6 94.7  PLT 300 171 [id number]   Basic Metabolic Panel: Recent Labs  Lab 06/25/21 1157 06/25/21 1901 06/26/21 0453 06/27/21 0426 06/27/21 0524 06/28/21 0501  NA 126* 129* 133* 138  --  137  K 2.2* 2.3* 2.7* 3.9  --  3.8  CL 77* 77* 92* 106  --  105  CO2 36* 32 35* 26  --  24  GLUCOSE 96 89 86 78  --  84  BUN 35* 35* 20 11  --  12  CREATININE 2.02* 2.15* 1.00 0.70  --  0.59  CALCIUM 8.7* 9.0 8.1* 8.3*  --  8.3*  MG  --  1.9 1.7  --  2.1  --   PHOS  --   --   --  1.2*  --  2.4*   GFR: Estimated Creatinine Clearance: 79.3 mL/min (by C-G formula based on SCr of 0.59 mg/dL). Liver Function Tests: Recent Labs  Lab 06/25/21 1157 06/27/21 0426  AST 21  --   ALT 14  --   ALKPHOS 84  --   BILITOT 0.6  --   PROT 8.0  --   ALBUMIN 4.2 2.5*   Recent Labs  Lab 06/25/21 1157  LIPASE 36   No results for input(s): AMMONIA in the last 168 hours. Coagulation Profile: No results for input(s): INR, PROTIME in the last 168 hours. Cardiac Enzymes: No results for input(s): CKTOTAL, CKMB, CKMBINDEX, TROPONINI in the last 168 hours. BNP (last 3 results) No results for input(s): PROBNP in the last 8760 hours. HbA1C: No results for input(s): HGBA1C in the last 72 hours. CBG: No results for input(s): GLUCAP in the last 168 hours. Lipid Profile: No results for input(s): CHOL, HDL, LDLCALC, TRIG, CHOLHDL, LDLDIRECT in the last 72 hours. Thyroid Function Tests: No results for input(s): TSH, T4TOTAL, FREET4, T3FREE, THYROIDAB in the last 72 hours. Anemia Panel: No results for input(s): VITAMINB12, FOLATE, FERRITIN, TIBC, IRON, RETICCTPCT in the last 72 hours. Urine analysis:    Component Value Date/Time   COLORURINE YELLOW 12/25/2020 0951   APPEARANCEUR CLOUDY (A) 12/25/2020 0951   LABSPEC 1.035 (H) 12/25/2020  0951   PHURINE 7.0 12/25/2020 0951   GLUCOSEU NEGATIVE 12/25/2020 0951   HGBUR MODERATE (A) 12/25/2020 0951   BILIRUBINUR NEGATIVE 12/25/2020 0951   KETONESUR 20 (A) 12/25/2020 0951   PROTEINUR 30 (A) 12/25/2020 0951   NITRITE NEGATIVE 12/25/2020 0951   LEUKOCYTESUR LARGE (A) 12/25/2020 0951   Sepsis Labs: @LABRCNTIP (procalcitonin:4,lacticidven:4)  ) Recent Results (from the past 240 hour(s))  Resp Panel by RT-PCR (Flu A&B, Covid) Nasopharyngeal Swab     Status: None   Collection Time: 06/25/21  5:40 PM   Specimen: Nasopharyngeal Swab; Nasopharyngeal(NP) swabs in vial transport medium  Result Value Ref Range Status   SARS Coronavirus 2 by RT PCR NEGATIVE NEGATIVE Final    Comment: (NOTE) SARS-CoV-2 target nucleic acids are NOT DETECTED.  The SARS-CoV-2 RNA is generally detectable in upper respiratory specimens during the  acute phase of infection. The lowest concentration of SARS-CoV-2 viral copies this assay can detect is 138 copies/mL. A negative result does not preclude SARS-Cov-2 infection and should not be used as the sole basis for treatment or other patient management decisions. A negative result may occur with  improper specimen collection/handling, submission of specimen other than nasopharyngeal swab, presence of viral mutation(s) within the areas targeted by this assay, and inadequate number of viral copies(<138 copies/mL). A negative result must be combined with clinical observations, patient history, and epidemiological information. The expected result is Negative.  Fact Sheet for Patients:  EntrepreneurPulse.com.au  Fact Sheet for Healthcare Providers:  IncredibleEmployment.be  This test is no t yet approved or cleared by the [place] FDA and  has been authorized for detection and/or diagnosis of SARS-CoV-2 by FDA under an Emergency Use Authorization (EUA). This EUA will remain  in effect (meaning this test can be used)  for the duration of the COVID-19 declaration under Section 564(b)(1) of the Act, 21 U.S.C.section 360bbb-3(b)(1), unless the authorization is terminated  or revoked sooner.       Influenza A by PCR NEGATIVE NEGATIVE Final   Influenza B by PCR NEGATIVE NEGATIVE Final    Comment: (NOTE) The Xpert Xpress SARS-CoV-2/FLU/RSV plus assay is intended as an aid in the diagnosis of influenza from Nasopharyngeal swab specimens and should not be used as a sole basis for treatment. Nasal washings and aspirates are unacceptable for Xpert Xpress SARS-CoV-2/FLU/RSV testing.  Fact Sheet for Patients: EntrepreneurPulse.com.au  Fact Sheet for Healthcare Providers: IncredibleEmployment.be  This test is not yet approved or cleared by the [place] FDA and has been authorized for detection and/or diagnosis of SARS-CoV-2 by FDA under an Emergency Use Authorization (EUA). This EUA will remain in effect (meaning this test can be used) for the duration of the COVID-19 declaration under Section 564(b)(1) of the Act, 21 U.S.C. section 360bbb-3(b)(1), unless the authorization is terminated or revoked.  Performed at [facility], [address] [place],  [address]       Studies: CT ABDOMEN PELVIS W CONTRAST  Result Date: 06/28/2021 CLINICAL DATA:  46 year old female with abdominal pain, nausea vomiting. Pneumobilia with no definite prior sphincterotomy per the EMR. But history of GI bleeding due to duodenal ulcer in 2017 treated with endoscopy at that time. EXAM: CT ABDOMEN AND PELVIS WITH CONTRAST TECHNIQUE: Multidetector CT imaging of the abdomen and pelvis was performed using the standard protocol following bolus administration of intravenous contrast. RADIATION DOSE REDUCTION: This exam was performed according to the departmental dose-optimization program which includes automated exposure control, adjustment of the mA and/or kV according to patient size  and/or use of iterative reconstruction technique. CONTRAST:  145mL OMNIPAQUE IOHEXOL 300 MG/ML  SOLN COMPARISON:  Noncontrast CT Abdomen and Pelvis 06/25/2021. CT with contrast 12/24/2020. FINDINGS: Lower chest: Lung bases are clear aside from trace fluid or atelectasis in the posterior right costophrenic angle. No pericardial effusion. Hepatobiliary: Pneumobilia redemonstrated, stable from the recent noncontrast CT. New from last year. Gallbladder appears partially contracted with mild wall thickening and enhancement unchanged from last year. No convincing gallstone or choledocholithiasis. The CBD appears to taper distally. Liver enhancement remains within normal limits. Pancreas: Negative. Spleen: Negative. Adrenals/Urinary Tract: Chronic partially calcified adrenal gland could be postinflammatory or posttraumatic. Left adrenal remains normal. No discrete adrenal nodule. Small round dystrophic calcification in more since pouch on the right is stable since 2021 and appears inconsequential. Nonobstructed kidneys with symmetric renal enhancement and contrast excretion. No  nephrolithiasis or pararenal inflammation. Ureters appear decompressed. Unremarkable bladder. Occasional pelvic phleboliths. Stomach/Bowel: Retained gas and low-density stool in redundant large bowel from the splenic flexure distally. Less transverse colon retained stool. Decompressed hepatic flexure today. Somewhat redundant right colon, and the cecum is in the midline of the lower abdomen. The appendix remains normal on series 5, image 36. No convincing large bowel inflammation. Decompressed terminal ileum. Oral contrast has not quite reached the TI. No dilated small bowel elsewhere. Stomach is distended with air and fluid with possible generalized gastric mucosal hyperenhancement. Indistinct appearance of the gastric antrum but improved from the CTs last year. No perforated gastric ulcer identified. Decompressed duodenal bulb. Mostly decompressed  duodenum bulb and C loop. No free air. No abdominal free fluid. Vascular/Lymphatic: Advanced Aortoiliac calcified atherosclerosis. Major arterial structures remain patent. 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 No lymphadenopathy identified. Reproductive: Within normal limits. Other: Trace free fluid in the pelvis with simple fluid density. 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 Advanced disc degeneration there with vacuum disc. No acute osseous abnormality identified. IMPRESSION: 1. Pneumobilia appears unchanged from 3 days ago, new since last year. Partially contracted gallbladder with mild chronic wall thickening. No bile duct obstructing etiology by CT. 2. Patulous stomach with mucosal hyperenhancement. Consider Acute Gastritis. Inflammation in the region of the gastric antrum has regressed since last year, with no evidence of perforated ulcer at this time. 3. Trace free fluid in the pelvis is nonspecific but may be physiologic. 4. No other acute or inflammatory process identified in the abdomen or pelvis. Advanced Aortic Atherosclerosis (ICD10-I70.0). Redundant large bowel with retained stool. Normal appendix in the midline. Electronically Signed   By: Genevie Ann M.D.   On: 06/28/2021 08:35    Scheduled Meds:  [START ON 06/29/2021] enoxaparin (LOVENOX) injection  40 mg Subcutaneous Q24H   feeding supplement  237 mL Oral BID BM   multivitamin with minerals  1 tablet Oral Daily   pantoprazole (PROTONIX) IV  40 mg Intravenous BID   potassium & sodium phosphates  2 packet Oral TID WC & HS    Continuous Infusions:  promethazine (PHENERGAN) injection (IM or IVPB) 12.5 mg (06/27/21 2323)     LOS: 3 days     Kayleen Memos, MD [facility] Pager [phone]  If 7PM-7AM, please contact night-coverage [email or web address] Password [facility] 06/28/2021, 8:44 AM

## 2021-06-28 NOTE — Transfer of Care (Signed)
Immediate Anesthesia Transfer of Care Note  Patient: Kristina Cardenas  Procedure(s) Performed: ESOPHAGOGASTRODUODENOSCOPY (EGD) WITH PROPOFOL BIOPSY  Patient Location: PACU  Anesthesia Type:General  Level of Consciousness: awake, alert , oriented and patient cooperative  Airway & Oxygen Therapy: Patient Spontanous Breathing  Post-op Assessment: Report given to RN, Post -op Vital signs reviewed and stable and Patient moving all extremities X 4  Post vital signs: Reviewed and stable  Last Vitals:  Vitals Value Taken Time  BP 87/64 06/28/21 1356  Temp    Pulse 53 06/28/21 1357  Resp 16 06/28/21 1357  SpO2 99 % 06/28/21 1357  Vitals shown include unvalidated device data.  Last Pain:  Vitals:   06/28/21 1320  TempSrc:   PainSc: 8       Patients Stated Pain Goal: 9 ([id number])  Complications: No notable events documented.

## 2021-06-28 NOTE — Brief Op Note (Signed)
06/25/2021 - 06/28/2021  2:03 PM  PATIENT:  Kristina Cardenas  46 y.o. female  PRE-OPERATIVE DIAGNOSIS:  refractory nausea, abdominal pain, history of gastric ulcers  POST-OPERATIVE DIAGNOSIS:  Gastritis; Gastric scars; Gastric Ulcer; Esophagitis;   PROCEDURE:  Procedure(s): ESOPHAGOGASTRODUODENOSCOPY (EGD) WITH PROPOFOL (N/A) BIOPSY  SURGEON:  Surgeon(s) and Role:    * Dolores Frame, MD - Primary  Underwent EGD under propofol sedation.  Tolerated the procedure adequately.  Patient was found to have LA Grade C (one or more mucosal breaks continuous between tops of 2 or more mucosal folds, less than 75% circumference) esophagitis with no bleeding was found in the lower third of the esophagus.  One non-bleeding cratered gastric ulcer with a clean ulcer base (Forrest Class III) was found at the pylorus.  The lesion was 10 mm in largest dimension. This ulcer was just proximal to the pyloric deformity/stenosis. Biopsies were taken with a cold forceps for Helicobacter pylori testing.  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  This was traversed after downsizing the scope to a pediatric ultra slim scope given severe deformity and narrowing of the pylorus.The examined duodenum was normal.   RECOMMENDATIONS - Return patient to hospital ward for ongoing care.  - Full liquid diet.  - Await pathology results.  - Use Protonix (pantoprazole) 40 mg IV daily.  - No ibuprofen, naproxen, or other non-steroidal anti-inflammatory drugs.  - Consider surgical evaluation for possible antrectomy.  Katrinka Blazing, MD Gastroenterology and Hepatology [facility]

## 2021-06-28 NOTE — Anesthesia Preprocedure Evaluation (Signed)
Anesthesia Evaluation  Patient identified by MRN, date of birth, ID band Patient awake    Reviewed: Allergy & Precautions, NPO status , Patient's Chart, lab work & pertinent test results  Airway Mallampati: II  TM Distance: >3 FB Neck ROM: Full    Dental  (+) Dental Advisory Given, Missing   Pulmonary Current Smoker and Patient abstained from smoking.,    Pulmonary exam normal breath sounds clear to auscultation       Cardiovascular Exercise Tolerance: Poor Normal cardiovascular exam Rhythm:Regular Rate:Normal     Neuro/Psych  Neuromuscular disease CVA, Residual Symptoms    GI/Hepatic Neg liver ROS, PUD, GERD  Medicated and Poorly Controlled,  Endo/Other  negative endocrine ROS  Renal/GU negative Renal ROS     Musculoskeletal negative musculoskeletal ROS (+)   Abdominal   Peds  Hematology negative hematology ROS (+)   Anesthesia Other Findings Severe malnutrition  Reproductive/Obstetrics negative OB ROS                             Anesthesia Physical Anesthesia Plan  ASA: 3  Anesthesia Plan: General   Post-op Pain Management: Minimal or no pain anticipated   Induction: Intravenous  PONV Risk Score and Plan: TIVA  Airway Management Planned: Nasal Cannula and Natural Airway  Additional Equipment:   Intra-op Plan:   Post-operative Plan:   Informed Consent: I have reviewed the patients History and Physical, chart, labs and discussed the procedure including the risks, benefits and alternatives for the proposed anesthesia with the patient or authorized representative who has indicated his/her understanding and acceptance.     Dental advisory given  Plan Discussed with: CRNA and Surgeon  Anesthesia Plan Comments:         Anesthesia Quick Evaluation

## 2021-06-28 NOTE — Progress Notes (Signed)
Case discussed with on call surgeon Dr. Okey Dupre and advanced endoscopist Dr. Rush Landmark. We will keep on liquid diet and aggressive PPI BID for at least 3 months, will repeat EGD as outpatient in 6 weeks to assess patency of gastric outlet and healing. If improving can advance diet but if persistently critically stenosed, may consider placement of AXIOS stent (Would need to be less than 1.5 cm in length completely to try the 15 mm AXIOS or less than 1 cm in length if trying the 20 mm AXIOS) vs partial gastrectomy.

## 2021-06-28 NOTE — Anesthesia Postprocedure Evaluation (Signed)
Anesthesia Post Note  Patient: Kristina Cardenas  Procedure(s) Performed: ESOPHAGOGASTRODUODENOSCOPY (EGD) WITH PROPOFOL BIOPSY  Patient location during evaluation: PACU Anesthesia Type: General Level of consciousness: awake and alert and oriented Pain management: pain level controlled Vital Signs Assessment: post-procedure vital signs reviewed and stable Respiratory status: spontaneous breathing, nonlabored ventilation and respiratory function stable Cardiovascular status: blood pressure returned to baseline and stable Postop Assessment: no apparent nausea or vomiting Anesthetic complications: no   No notable events documented.   Last Vitals:  Vitals:   06/28/21 1415 06/28/21 1430  BP: 130/81 138/86  Pulse: (!) 57 89  Resp: 12 16  Temp:  36.6 C  SpO2: 100% 100%    Last Pain:  Vitals:   06/28/21 1517  TempSrc:   PainSc: Asleep                 Noga Fogg C Kleo Dungee

## 2021-06-28 NOTE — Progress Notes (Signed)
Went in to ensure patient was starting second bottle of contrast. Patient was a sleep. Patient only drank half of first bottle informed her she needed to try to drink the whole bottle for it will help with the quality of imaging going to be taken in CT, Patient acknowledged. Informed CT Tech of patient progress with contrast.

## 2021-06-29 DIAGNOSIS — R1013 Epigastric pain: Secondary | ICD-10-CM

## 2021-06-29 DIAGNOSIS — K209 Esophagitis, unspecified without bleeding: Secondary | ICD-10-CM

## 2021-06-29 DIAGNOSIS — E43 Unspecified severe protein-calorie malnutrition: Secondary | ICD-10-CM

## 2021-06-29 DIAGNOSIS — K311 Adult hypertrophic pyloric stenosis: Secondary | ICD-10-CM

## 2021-06-29 DIAGNOSIS — E876 Hypokalemia: Secondary | ICD-10-CM

## 2021-06-29 DIAGNOSIS — E860 Dehydration: Secondary | ICD-10-CM

## 2021-06-29 LAB — COMPREHENSIVE METABOLIC PANEL
ALT: 11 U/L (ref 0–44)
AST: 14 U/L — ABNORMAL LOW (ref 15–41)
Albumin: 2.5 g/dL — ABNORMAL LOW (ref 3.5–5.0)
Alkaline Phosphatase: 52 U/L (ref 38–126)
Anion gap: 6 (ref 5–15)
BUN: 11 mg/dL (ref 6–20)
CO2: 25 mmol/L (ref 22–32)
Calcium: 8.1 mg/dL — ABNORMAL LOW (ref 8.9–10.3)
Chloride: 106 mmol/L (ref 98–111)
Creatinine, Ser: 0.47 mg/dL (ref 0.44–1.00)
GFR, Estimated: >60 mL/min (ref >60)
Glucose, Bld: 94 mg/dL (ref 70–99)
Potassium: 3.4 mmol/L — ABNORMAL LOW (ref 3.5–5.1)
Sodium: 137 mmol/L (ref 135–145)
Total Bilirubin: 0.2 mg/dL — ABNORMAL LOW (ref 0.3–1.2)
Total Protein: 5.3 g/dL — ABNORMAL LOW (ref 6.5–8.1)

## 2021-06-29 LAB — CBC
HCT: 36.7 % (ref 36.0–46.0)
Hemoglobin: 11.7 g/dL — ABNORMAL LOW (ref 12.0–15.0)
MCH: 29.8 pg (ref 26.0–34.0)
MCHC: 31.9 g/dL (ref 30.0–36.0)
MCV: 93.6 fL (ref 80.0–100.0)
Platelets: 173 10*3/uL (ref 150–400)
RBC: 3.92 MIL/uL (ref 3.87–5.11)
RDW: 13.5 % (ref 11.5–15.5)
WBC: 5.6 10*3/uL (ref 4.0–10.5)
nRBC: 0 % (ref 0.0–0.2)

## 2021-06-29 LAB — MAGNESIUM: Magnesium: 1.5 mg/dL — ABNORMAL LOW (ref 1.7–2.4)

## 2021-06-29 LAB — PHOSPHORUS: Phosphorus: 3.1 mg/dL (ref 2.5–4.6)

## 2021-06-29 MED ORDER — PANTOPRAZOLE SODIUM 40 MG PO TBEC: 40.0000 mg | DELAYED_RELEASE_TABLET | Freq: Two times a day (BID) | ORAL | 2 refills | Status: AC

## 2021-06-29 MED ORDER — POTASSIUM CHLORIDE 10 MEQ/100ML IV SOLN: 10.0000 meq | INTRAVENOUS | Status: DC

## 2021-06-29 MED ORDER — MAGNESIUM OXIDE -MG SUPPLEMENT 400 (240 MG) MG PO TABS: 400.0000 mg | ORAL_TABLET | Freq: Two times a day (BID) | ORAL | 0 refills | Status: AC

## 2021-06-29 MED ORDER — LORAZEPAM 2 MG/ML IJ SOLN
0.5000 mg | Freq: Once | INTRAMUSCULAR | Status: AC | PRN
  Administered 2021-06-29: 0.5 mg via INTRAVENOUS
  Filled 2021-06-29: qty 1

## 2021-06-29 MED ORDER — LIDOCAINE VISCOUS HCL 2 % MT SOLN: 15.0000 mL | OROMUCOSAL | 0 refills | Status: DC | PRN

## 2021-06-29 MED ORDER — MAGNESIUM OXIDE -MG SUPPLEMENT 400 (240 MG) MG PO TABS
400.0000 mg | ORAL_TABLET | Freq: Two times a day (BID) | ORAL | Status: DC
  Administered 2021-06-29: 400 mg via ORAL
  Filled 2021-06-29: qty 1

## 2021-06-29 MED ORDER — SUCRALFATE 1 G PO TABS: 1.0000 g | ORAL_TABLET | Freq: Four times a day (QID) | ORAL | 1 refills | Status: AC

## 2021-06-29 MED ORDER — ONDANSETRON 4 MG PO TBDP
4.0000 mg | ORAL_TABLET | Freq: Three times a day (TID) | ORAL | 0 refills | Status: DC | PRN
Start: 2021-06-29 — End: 2021-07-29

## 2021-06-29 MED ORDER — ACETAMINOPHEN 500 MG PO TABS
1000.0000 mg | ORAL_TABLET | Freq: Four times a day (QID) | ORAL | 0 refills | Status: AC | PRN
Start: 2021-06-29 — End: ?

## 2021-06-29 MED ORDER — HYDROXYZINE HCL 25 MG PO TABS: 25.0000 mg | ORAL_TABLET | Freq: Three times a day (TID) | ORAL | 0 refills | Status: AC | PRN

## 2021-06-29 MED ORDER — HYDROXYZINE HCL 25 MG PO TABS
25.0000 mg | ORAL_TABLET | Freq: Three times a day (TID) | ORAL | Status: DC | PRN
  Administered 2021-06-29: 25 mg via ORAL
  Filled 2021-06-29: qty 1

## 2021-06-29 MED ORDER — MAGNESIUM SULFATE 4 GM/100ML IV SOLN
4.0000 g | Freq: Once | INTRAVENOUS | Status: DC
Start: 2021-06-29 — End: 2021-06-29

## 2021-06-29 NOTE — Progress Notes (Signed)
Pt was found with hat and coat on trying to go down the elevator to "go for a walk," I explained that she is not able to go outside, educated pt on policy, and risks of leaving without seeing the doctor first. Encouraged her to stay and she left anyway.  ?

## 2021-06-29 NOTE — Discharge Summary (Signed)
Physician Discharge Summary  Kristina Cardenas WUJ:[id number] DOB: 11/03/1975 DOA: 06/25/2021  PCP: Pcp, No  Admit date: 06/25/2021 Discharge date: 06/29/2021  Admitted From: Home Disposition: Home  Recommendations for Outpatient Follow-up:  Follow up with PCP in 1-2 weeks Please obtain BMP/CBC in one week Plans are to follow-up with GI as an outpatient.  She will need repeat EGD in 6 weeks for surveillance of esophagitis and gastric ulcer with pyloric stenosis She will need to stay on full liquid diet until she follows up with GI for repeat EGD She has been seen by TOC to help establish care with a PCP She has been advised with her chronic pain issues, she be better served to actively seek out PCP since receiving pain medications for chronic pain from her inpatient stay versus ER would not be appropriate  Discharge Condition: Stable CODE STATUS: Full code Diet recommendation: Full liquids  Brief/Interim Summary: Kristina SchmidtKimberley Capili is a 46 y.o. female with medical history significant of GERD, duodenal ulcer with upper GI bleed, Bell's palsy, history of stroke, THC use disorder, who presented with 3 to 4-days history of burning epigastric pain with intractable vomiting over the past week.  Frequent use of NSAIDs, Goody powder up until 1 month ago.   Work-up revealed hypovolemic hyponatremia likely from GI losses with vomiting.  Started on IV fluid hydration, electrolyte replacement and IV antiemetics.  IV Protonix 40 mg twice daily started on 06/26/2021.  Due to persistent symptomatology GI was consulted  Discharge Diagnoses:  Principal Problem:   Acute hyponatremia Active Problems:   Acute hypokalemia   Nausea and vomiting   Dehydration   Hyponatremia   Protein-calorie malnutrition, severe   Acute esophagitis   Gastric outlet obstruction   Abdominal pain, epigastric  Resolved hypovolemic hyponatremia likely secondary to poor oral intake from nausea and vomiting. Presented with serum  sodium of 126, corrected with IV fluid. Serum sodium 137.      Intractable nausea and vomiting with prior history of gastric ulcer. Negative pregnancy test 06/25/2021 Treated with antiemetics and PPI Appears to be tolerating full liquid diet Had EGD on 2/28 that did show esophagitis, gastric ulcer with pyloric narrowing. GI discussed case with general surgery as well as Dr. Meridee ScoreMansouraty in [place] with recommendations to continue PPI therapy for the next 3 months She will need to refrain from NSAID use She will have repeat EGD in 6 weeks and if she continues to have significant pyloric stenosis or ulcers, may benefit from GI stent versus antrectomy replaced   Severe hypophosphatemia secondary to poor oral intake. Serum phosphorus 1.2> 2.4 Repleted intravenously and orally Repeat serum phosphorus level in the morning, improved 2.4. Continue oral replacement.   Resolved post repletion: Refractory severe hypokalemia Replaced   History of GERD and duodenal ulcers Continue on Protonix twice daily She was also started on Carafate She received viscous lidocaine She has been advised to refrain from any NSAID use   Resolved AKI likely prerenal in the setting of dehydration and poor oral intake Baseline creatinine appears to be 0.5 with GFR greater than 60 Renal function is back to baseline. Continue to avoid nephrotoxic agents, dehydration and hypotension.  Avoid NSAIDs.  Discharge Instructions  Discharge Instructions     Diet full liquid   Complete by: As directed    Increase activity slowly   Complete by: As directed       Allergies as of 06/29/2021       Reactions   Sulfa Antibiotics  Toradol [ketorolac Tromethamine]         Medication List     TAKE these medications    acetaminophen 500 MG tablet Commonly known as: TYLENOL Take 2 tablets (1,000 mg total) by mouth every 6 (six) hours as needed. What changed: how much to take   hydrOXYzine 25 MG  tablet Commonly known as: ATARAX Take 1 tablet (25 mg total) by mouth 3 (three) times daily as needed for anxiety.   lidocaine 2 % solution Commonly known as: XYLOCAINE Use as directed 15 mLs in the mouth or throat every 4 (four) hours as needed for mouth pain.   magnesium oxide 400 (240 Mg) MG tablet Commonly known as: MAG-OX Take 1 tablet (400 mg total) by mouth 2 (two) times daily.   ondansetron 4 MG disintegrating tablet Commonly known as: ZOFRAN-ODT Take 1 tablet (4 mg total) by mouth every 8 (eight) hours as needed for nausea or vomiting.   pantoprazole 40 MG tablet Commonly known as: PROTONIX Take 1 tablet (40 mg total) by mouth 2 (two) times daily.   sucralfate 1 g tablet Commonly known as: Carafate Take 1 tablet (1 g total) by mouth 4 (four) times daily.        Follow-up Information     Dolores Frame, MD Follow up.   Specialty: Gastroenterology Contact information: [address] [place] [address] [phone]                Allergies  Allergen Reactions   Sulfa Antibiotics    Toradol [Ketorolac Tromethamine]     Consultations: Gastroenterology   Procedures/Studies: CT ABDOMEN PELVIS WO CONTRAST  Result Date: 06/25/2021 CLINICAL DATA:  Abdominal pain EXAM: CT ABDOMEN AND PELVIS WITHOUT CONTRAST TECHNIQUE: Multidetector CT imaging of the abdomen and pelvis was performed following the standard protocol without IV contrast. RADIATION DOSE REDUCTION: This exam was performed according to the departmental dose-optimization program which includes automated exposure control, adjustment of the mA and/or kV according to patient size and/or use of iterative reconstruction technique. COMPARISON:  12/24/2020 FINDINGS: Lower chest: Visualized lower lung fields are clear. There are pockets of air in the wall of the lower thoracic esophagus. Hepatobiliary: There is air in the lumen of intrahepatic and extrahepatic bile ducts. There is  air in the lumen of gallbladder. Pancreas: No focal abnormality is seen. Spleen: Unremarkable. Adrenals/Urinary Tract: Adrenals are not enlarged. There is possible minimal calcification in the right adrenal. There is no hydronephrosis. There are few small right renal stones each measuring less than 3 mm. Ureters are not dilated. Urinary bladder is unremarkable. Stomach/Bowel: Stomach is moderately distended with air-fluid level. There is no significant small bowel dilation. Appendix is not dilated. There is no significant wall thickening in the colon. There is no pericolic stranding or fluid collection. Vascular/Lymphatic: Scattered arterial calcifications are seen. Reproductive: Uterus is unremarkable. There are no dominant adnexal masses. Other: There is no ascites or pneumoperitoneum. Musculoskeletal: There is first-degree anterolisthesis at L4-L5 level. Spondylolysis is seen in the L5 vertebra. Spinal stenosis and encroachment of neural foramina is seen at multiple levels in the lumbar spine. IMPRESSION: There is no evidence of intestinal obstruction or pneumoperitoneum. Appendix is not dilated. There is no hydronephrosis. There is air in the lumen of bile ducts and gallbladder possibly suggesting previous sphincterotomy. There is no significant dilation of bile ducts. There are few small right renal stones. Lumbar spondylosis with spinal stenosis and encroachment of neural foramina at multiple levels. Spondylolysis is seen  in the L5 vertebra with first-degree spondylolisthesis at L5-S1 level. There are small pockets of air in the wall of the visualized lower thoracic esophagus close to the gastroesophageal junction. Significance of this pneumatosis is not clearly evident. This may be a benign process or suggest inflammatory or infectious process. Electronically Signed   By: Ernie Avena M.D.   On: 06/25/2021 15:41   CT ABDOMEN PELVIS W CONTRAST  Result Date: 06/28/2021 CLINICAL DATA:  46 year old  female with abdominal pain, nausea vomiting. Pneumobilia with no definite prior sphincterotomy per the EMR. But history of GI bleeding due to duodenal ulcer in 2017 treated with endoscopy at that time. EXAM: CT ABDOMEN AND PELVIS WITH CONTRAST TECHNIQUE: Multidetector CT imaging of the abdomen and pelvis was performed using the standard protocol following bolus administration of intravenous contrast. RADIATION DOSE REDUCTION: This exam was performed according to the departmental dose-optimization program which includes automated exposure control, adjustment of the mA and/or kV according to patient size and/or use of iterative reconstruction technique. CONTRAST:  OMNIPAQUE IOHEXOL 300 MG/ML  SOLN COMPARISON:  Noncontrast CT Abdomen and Pelvis 06/25/2021. CT with contrast 12/24/2020. FINDINGS: Lower chest: Lung bases are clear aside from trace fluid or atelectasis in the posterior right costophrenic angle. No pericardial effusion. Hepatobiliary: Pneumobilia redemonstrated, stable from the recent noncontrast CT. New from last year. Gallbladder appears partially contracted with mild wall thickening and enhancement unchanged from last year. No convincing gallstone or choledocholithiasis. The CBD appears to taper distally. Liver enhancement remains within normal limits. Pancreas: Negative. Spleen: Negative. Adrenals/Urinary Tract: Chronic partially calcified adrenal gland could be postinflammatory or posttraumatic. Left adrenal remains normal. No discrete adrenal nodule. Small round dystrophic calcification in more since pouch on the right is stable since 2021 and appears inconsequential. Nonobstructed kidneys with symmetric renal enhancement and contrast excretion. No nephrolithiasis or pararenal inflammation. Ureters appear decompressed. Unremarkable bladder. Occasional pelvic phleboliths. Stomach/Bowel: Retained gas and low-density stool in redundant large bowel from the splenic flexure distally. Less transverse  colon retained stool. Decompressed hepatic flexure today. Somewhat redundant right colon, and the cecum is in the midline of the lower abdomen. The appendix remains normal on series 5, image 36. No convincing large bowel inflammation. Decompressed terminal ileum. Oral contrast has not quite reached the TI. No dilated small bowel elsewhere. Stomach is distended with air and fluid with possible generalized gastric mucosal hyperenhancement. Indistinct appearance of the gastric antrum but improved from the CTs last year. No perforated gastric ulcer identified. Decompressed duodenal bulb. Mostly decompressed duodenum bulb and C loop. No free air. No abdominal free fluid. Vascular/Lymphatic: Advanced Aortoiliac calcified atherosclerosis. Major arterial structures remain patent. 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 No lymphadenopathy identified. Reproductive: Within normal limits. Other: Trace free fluid in the pelvis with simple fluid density. 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 Advanced disc degeneration there with vacuum disc. No acute osseous abnormality identified. IMPRESSION: 1. Pneumobilia appears unchanged from 3 days ago, new since last year. Partially contracted gallbladder with mild chronic wall thickening. No bile duct obstructing etiology by CT. 2. Patulous stomach with mucosal hyperenhancement. Consider Acute Gastritis. Inflammation in the region of the gastric antrum has regressed since last year, with no evidence of perforated ulcer at this time. 3. Trace free fluid in the pelvis is nonspecific but may be physiologic. 4. No other acute or inflammatory process identified in the  abdomen or pelvis. Advanced Aortic Atherosclerosis (ICD10-I70.0). Redundant large bowel with retained stool. Normal appendix in the midline. Electronically Signed   By: Odessa Fleming M.D.   On:  06/28/2021 08:35      Subjective: Reports that she does have continued pain in her abdomen, although she does want to advance her diet and does not understand why she cannot be on liquids.  Reports that her pain has been chronic.  She was noted by staff to be dressed in her usual close and left the floor despite being told by staff that per policy, she needed to stay on the inpatient unit.  Discharge Exam: Vitals:   06/28/21 1430 06/28/21 2101 06/29/21 0539 06/29/21 1212  BP: 138/86 100/67 124/81 116/71  Pulse: 89 (!) 58 (!) 48 65  Resp: 16 16 18 16   Temp: 97.9 F (36.6 C) 99.5 F (37.5 C) 99 F (37.2 C) 98.4 F (36.9 C)  TempSrc: Oral  Oral Oral  SpO2: 100% 100% 100% 100%  Weight:      Height:        General: Pt is alert, awake, not in acute distress Cardiovascular: RRR, S1/S2 +, no rubs, no gallops Respiratory: CTA bilaterally, no wheezing, no rhonchi Abdominal: Soft, NT, ND, bowel sounds + Extremities: no edema, no cyanosis    The results of significant diagnostics from this hospitalization (including imaging, microbiology, ancillary and laboratory) are listed below for reference.     Microbiology: Recent Results (from the past 240 hour(s))  Resp Panel by RT-PCR (Flu A&B, Covid) Nasopharyngeal Swab     Status: None   Collection Time: 06/25/21  5:40 PM   Specimen: Nasopharyngeal Swab; Nasopharyngeal(NP) swabs in vial transport medium  Result Value Ref Range Status   SARS Coronavirus 2 by RT PCR NEGATIVE NEGATIVE Final    Comment: (NOTE) SARS-CoV-2 target nucleic acids are NOT DETECTED.  The SARS-CoV-2 RNA is generally detectable in upper respiratory specimens during the acute phase of infection. The lowest concentration of SARS-CoV-2 viral copies this assay can detect is 138 copies/mL. A negative result does not preclude SARS-Cov-2 infection and should not be used as the sole basis for treatment or other patient management decisions. A negative result may occur  with  improper specimen collection/handling, submission of specimen other than nasopharyngeal swab, presence of viral mutation(s) within the areas targeted by this assay, and inadequate number of viral copies(<138 copies/mL). A negative result must be combined with clinical observations, patient history, and epidemiological information. The expected result is Negative.  Fact Sheet for Patients:  06/27/21  Fact Sheet for Healthcare Providers:  BloggerCourse.com  This test is no t yet approved or cleared by the [place] FDA and  has been authorized for detection and/or diagnosis of SARS-CoV-2 by FDA under an Emergency Use Authorization (EUA). This EUA will remain  in effect (meaning this test can be used) for the duration of the COVID-19 declaration under Section 564(b)(1) of the Act, 21 U.S.C.section 360bbb-3(b)(1), unless the authorization is terminated  or revoked sooner.       Influenza A by PCR NEGATIVE NEGATIVE Final   Influenza B by PCR NEGATIVE NEGATIVE Final    Comment: (NOTE) The Xpert Xpress SARS-CoV-2/FLU/RSV plus assay is intended as an aid in the diagnosis of influenza from Nasopharyngeal swab specimens and should not be used as a sole basis for treatment. Nasal washings and aspirates are unacceptable for Xpert Xpress SARS-CoV-2/FLU/RSV testing.  Fact Sheet for Patients: [place]  Fact Sheet for Healthcare Providers: BloggerCourse.com  This test is not yet approved or cleared by the [place] FDA and has been authorized for detection and/or diagnosis of SARS-CoV-2 by FDA under an Emergency Use Authorization (EUA). This EUA will remain in effect (meaning this test can be used) for the duration of the COVID-19 declaration under Section 564(b)(1) of the Act, 21 U.S.C. section 360bbb-3(b)(1), unless the authorization is terminated  or revoked.  Performed at [facility], [address] [place], [place] [id number]      Labs: BNP (last 3 results) No results for input(s): BNP in the last 8760 hours. Basic Metabolic Panel: Recent Labs  Lab 06/25/21 1901 06/26/21 0453 06/27/21 0426 06/27/21 0524 06/28/21 0501 06/29/21 0454  NA 129* 133* 138  --  137 137  K 2.3* 2.7* 3.9  --  3.8 3.4*  CL 77* 92* 106  --  105 106  CO2 32 35* 26  --  24 25  GLUCOSE 89 86 78  --  84 94  BUN 35* 20 11  --  12 11  CREATININE 2.15* 1.00 0.70  --  0.59 0.47  CALCIUM 9.0 8.1* 8.3*  --  8.3* 8.1*  MG 1.9 1.7  --  2.1  --  1.5*  PHOS  --   --  1.2*  --  2.4* 3.1   Liver Function Tests: Recent Labs  Lab 06/25/21 1157 06/27/21 0426 06/29/21 0454  AST 21  --  14*  ALT 14  --  11  ALKPHOS 84  --  52  BILITOT 0.6  --  0.2*  PROT 8.0  --  5.3*  ALBUMIN 4.2 2.5* 2.5*   Recent Labs  Lab 06/25/21 1157  LIPASE 36   No results for input(s): AMMONIA in the last 168 hours. CBC: Recent Labs  Lab 06/25/21 1157 06/26/21 0453 06/28/21 0501 06/29/21 0454  WBC 8.1 4.8 5.3 5.6  HGB 15.8* 12.0 11.8* 11.7*  HCT 46.2* 38.2 37.6 36.7  MCV 89.7 93.6 94.7 93.6  PLT 300 171 167 173   Cardiac Enzymes: No results for input(s): CKTOTAL, CKMB, CKMBINDEX, TROPONINI in the last 168 hours. BNP: Invalid input(s): POCBNP CBG: No results for input(s): GLUCAP in the last 168 hours. D-Dimer No results for input(s): DDIMER in the last 72 hours. Hgb A1c No results for input(s): HGBA1C in the last 72 hours. Lipid Profile No results for input(s): CHOL, HDL, LDLCALC, TRIG, CHOLHDL, LDLDIRECT in the last 72 hours. Thyroid function studies No results for input(s): TSH, T4TOTAL, T3FREE, THYROIDAB in the last 72 hours.  Invalid input(s): FREET3 Anemia work up No results for input(s): VITAMINB12, FOLATE, FERRITIN, TIBC, IRON, RETICCTPCT in the last 72 hours. Urinalysis    Component Value Date/Time   COLORURINE YELLOW 12/25/2020 0951    APPEARANCEUR CLOUDY (A) 12/25/2020 0951   LABSPEC 1.035 (H) 12/25/2020 0951   PHURINE 7.0 12/25/2020 0951   GLUCOSEU NEGATIVE 12/25/2020 0951   HGBUR MODERATE (A) 12/25/2020 0951   BILIRUBINUR NEGATIVE 12/25/2020 0951   KETONESUR 20 (A) 12/25/2020 0951   PROTEINUR 30 (A) 12/25/2020 0951   NITRITE NEGATIVE 12/25/2020 0951   LEUKOCYTESUR LARGE (A) 12/25/2020 0951   Sepsis Labs Invalid input(s): PROCALCITONIN,  WBC,  LACTICIDVEN Microbiology Recent Results (from the past 240 hour(s))  Resp Panel by RT-PCR (Flu A&B, Covid) Nasopharyngeal Swab     Status: None   Collection Time: 06/25/21  5:40 PM   Specimen: Nasopharyngeal Swab; Nasopharyngeal(NP) swabs in vial transport medium  Result Value Ref Range Status   SARS Coronavirus  2 by RT PCR NEGATIVE NEGATIVE Final    Comment: (NOTE) SARS-CoV-2 target nucleic acids are NOT DETECTED.  The SARS-CoV-2 RNA is generally detectable in upper respiratory specimens during the acute phase of infection. The lowest concentration of SARS-CoV-2 viral copies this assay can detect is 138 copies/mL. A negative result does not preclude SARS-Cov-2 infection and should not be used as the sole basis for treatment or other patient management decisions. A negative result may occur with  improper specimen collection/handling, submission of specimen other than nasopharyngeal swab, presence of viral mutation(s) within the areas targeted by this assay, and inadequate number of viral copies(<138 copies/mL). A negative result must be combined with clinical observations, patient history, and epidemiological information. The expected result is Negative.  Fact Sheet for Patients:  BloggerCourse.com  Fact Sheet for Healthcare Providers:  [employer]ious[employer]ker.it  This test is no t yet approved or cleared by the [place] FDA and  has been authorized for detection and/or diagnosis of SARS-CoV-2 by FDA under an Emergency  Use Authorization (EUA). This EUA will remain  in effect (meaning this test can be used) for the duration of the COVID-19 declaration under Section 564(b)(1) of the Act, 21 U.S.C.section 360bbb-3(b)(1), unless the authorization is terminated  or revoked sooner.       Influenza A by PCR NEGATIVE NEGATIVE Final   Influenza B by PCR NEGATIVE NEGATIVE Final    Comment: (NOTE) The Xpert Xpress SARS-CoV-2/FLU/RSV plus assay is intended as an aid in the diagnosis of influenza from Nasopharyngeal swab specimens and should not be used as a sole basis for treatment. Nasal washings and aspirates are unacceptable for Xpert Xpress SARS-CoV-2/FLU/RSV testing.  Fact Sheet for Patients: BloggerCourse.com  Fact Sheet for Healthcare Providers: [employer]ious[employer]ker.it  This test is not yet approved or cleared by the [place] FDA and has been authorized for detection and/or diagnosis of SARS-CoV-2 by FDA under an Emergency Use Authorization (EUA). This EUA will remain in effect (meaning this test can be used) for the duration of the COVID-19 declaration under Section 564(b)(1) of the Act, 21 U.S.C. section 360bbb-3(b)(1), unless the authorization is terminated or revoked.  Performed at [facility], [address], [place], [place] [address]      Time coordinating discharge:  SIGNED:   Erick Blinks, MD  [facility] 06/29/2021, 9:28 PM   If 7PM-7AM, please contact night-coverage [email or web address]

## 2021-06-29 NOTE — Progress Notes (Signed)
Subjective: Patient reports she is very hungry. She is in pain and continues to have nausea. She is upset that she thinks nursing staff is putting powdered potassium into all of her drinks as she cannot tolerate it. She does not understand why she cannot eat, but tells me that she is unable to keep down protein shake. She had a [place] sandwich a few days ago that she kept down and is requesting to have one of these today. She also states that in [place] when she was hospitalized previously she got pain and nausea medicine much more frequently than she is getting here.   Objective: Vital signs in last 24 hours: Temp:  [97.9 F (36.6 C)-99.5 F (37.5 C)] 99 F (37.2 C) (03/01 0539) Pulse Rate:  [45-89] 48 (03/01 0539) Resp:  [12-18] 18 (03/01 0539) BP: (86-138)/(64-86) 124/81 (03/01 0539) SpO2:  [99 %-100 %] 100 % (03/01 0539) Weight:  [57.2 kg] 57.2 kg (02/28 1035) Last BM Date :  (patient states a few days ago not sure of date) General:   Alert and oriented, disgruntled. Head:  Normocephalic and atraumatic. Eyes:  No icterus, sclera clear. Conjuctiva pink.  Mouth:  Without lesions, mucosa pink and moist.  Heart:  S1, S2 present, no murmurs noted.  Lungs: Clear to auscultation bilaterally, without wheezing, rales, or rhonchi.  Abdomen:  Bowel sounds present, soft, non-distended. Epigastric Tenderness. No HSM or hernias noted. No rebound or guarding. No masses appreciated  Msk:  Symmetrical without gross deformities. Normal posture. Neurologic:  Alert and  oriented x4;  grossly normal neurologically. Skin:  Warm and dry, intact without significant lesions.  Psych:  Alert and cooperative. Normal mood and affect.  Intake/Output from previous day: 02/28 0701 - 03/01 0700 In: 600 [I.V.:600] Out: -  Intake/Output this shift: Total I/O In: 592 [P.O.:592] Out: -   Lab Results: Recent Labs    06/28/21 0501 06/29/21 0454  WBC 5.3 5.6  HGB 11.8* 11.7*  HCT 37.6 36.7  PLT 167 173    BMET Recent Labs    06/27/21 0426 06/28/21 0501 06/29/21 0454  NA 138 137 137  K 3.9 3.8 3.4*  CL 106 105 106  CO2 26 24 25   GLUCOSE 78 84 94  BUN 11 12 11   CREATININE 0.70 0.59 0.47  CALCIUM 8.3* 8.3* 8.1*   LFT Recent Labs    06/27/21 0426 06/29/21 0454  PROT  --  5.3*  ALBUMIN 2.5* 2.5*  AST  --  14*  ALT  --  11  ALKPHOS  --  52  BILITOT  --  0.2*    Studies/Results: CT ABDOMEN PELVIS W CONTRAST  Result Date: 06/28/2021 CLINICAL DATA:  46 year old female with abdominal pain, nausea vomiting. Pneumobilia with no definite prior sphincterotomy per the EMR. But history of GI bleeding due to duodenal ulcer in 2017 treated with endoscopy at that time. EXAM: CT ABDOMEN AND PELVIS WITH CONTRAST TECHNIQUE: Multidetector CT imaging of the abdomen and pelvis was performed using the standard protocol following bolus administration of intravenous contrast. RADIATION DOSE REDUCTION: This exam was performed according to the departmental dose-optimization program which includes automated exposure control, adjustment of the mA and/or kV according to patient size and/or use of iterative reconstruction technique. CONTRAST:  49 OMNIPAQUE IOHEXOL 300 MG/ML  SOLN COMPARISON:  Noncontrast CT Abdomen and Pelvis 06/25/2021. CT with contrast 12/24/2020. FINDINGS: Lower chest: Lung bases are clear aside from trace fluid or atelectasis in the posterior right costophrenic angle. No pericardial effusion. Hepatobiliary:  Pneumobilia redemonstrated, stable from the recent noncontrast CT. New from last year. Gallbladder appears partially contracted with mild wall thickening and enhancement unchanged from last year. No convincing gallstone or choledocholithiasis. The CBD appears to taper distally. Liver enhancement remains within normal limits. Pancreas: Negative. Spleen: Negative. Adrenals/Urinary Tract: Chronic partially calcified adrenal gland could be postinflammatory or posttraumatic. Left adrenal  remains normal. No discrete adrenal nodule. Small round dystrophic calcification in more since pouch on the right is stable since 2021 and appears inconsequential. Nonobstructed kidneys with symmetric renal enhancement and contrast excretion. No nephrolithiasis or pararenal inflammation. Ureters appear decompressed. Unremarkable bladder. Occasional pelvic phleboliths. Stomach/Bowel: Retained gas and low-density stool in redundant large bowel from the splenic flexure distally. Less transverse colon retained stool. Decompressed hepatic flexure today. Somewhat redundant right colon, and the cecum is in the midline of the lower abdomen. The appendix remains normal on series 5, image 36. No convincing large bowel inflammation. Decompressed terminal ileum. Oral contrast has not quite reached the TI. No dilated small bowel elsewhere. Stomach is distended with air and fluid with possible generalized gastric mucosal hyperenhancement. Indistinct appearance of the gastric antrum but improved from the CTs last year. No perforated gastric ulcer identified. Decompressed duodenal bulb. Mostly decompressed duodenum bulb and C loop. No free air. No abdominal free fluid. Vascular/Lymphatic: Advanced Aortoiliac calcified atherosclerosis. Major arterial structures remain patent. 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 No lymphadenopathy identified. Reproductive: Within normal limits. Other: Trace free fluid in the pelvis with simple fluid density. 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 Advanced disc degeneration there with vacuum disc. No acute osseous abnormality identified. IMPRESSION: 1. Pneumobilia appears unchanged from 3 days ago, new since last year. Partially contracted gallbladder with mild chronic wall thickening. No bile duct obstructing etiology by CT. 2. Patulous stomach with mucosal  hyperenhancement. Consider Acute Gastritis. Inflammation in the region of the gastric antrum has regressed since last year, with no evidence of perforated ulcer at this time. 3. Trace free fluid in the pelvis is nonspecific but may be physiologic. 4. No other acute or inflammatory process identified in the abdomen or pelvis. Advanced Aortic Atherosclerosis (ICD10-I70.0). Redundant large bowel with retained stool. Normal appendix in the midline. Electronically Signed   By: Odessa Fleming M.D.   On: 06/28/2021 08:35    Assessment: Kristina Cardenas is a 46 year old female with history of GERD, previous GI bleed due to duodenal ulcer in 2017 s/p treatment with epinephrine and hemostatic clip, subsequently leaving facility AMA previously, hx of THC and cocaine use, who Presented to ER 06/25/2021 with 3 to 4 days of abdominal pain and intractable vomiting.  CT A/P w/o contrast with air in lume of bile ducts and gallbladder, suggesting possibly sphincterotomy.   Epigastric Pain/nausea/vomiting: pain without radiation. Evidence of electrolyte imbalance and malnutrition, poor appetite with 40lbs reported weight loss.  Patient underwent EGD yesterday with findings of  LA Grade C  esophagitis with no bleeding lower third of the esophagus.  One non-bleeding cratered gastric ulcer with a clean ulcer base (Forrest Class III) at the pylorus.  The lesion was 10 mm in largest dimension. This ulcer was just proximal to the pyloric deformity/stenosis. Biopsies were taken for H pylori-still pending.  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  This was traversed after downsizing the scope to a pediatric ultra  slim scope given severe deformity and narrowing of the pylorus.The examined duodenum was normal.   Dr. Levon Hedger discussed case with Dr. Robyne Peers and Advanced endoscopist Dr. Meridee Score after findings of EGD yesterday, pt to be kept on liquid diet and PPI BID x3 months, repeat  EGD in 6 weeks to assess patency of gastric outlet and healing, can advance diet if improving, but if persistently critically stenosed, she may require placement of AXIOS stent vs partial gastrectomy.  Notably, Patient is very upset that she is only receiving liquids thus far, does not understand why she cannot eat. I had a thorough discussion with her in regards to her EGD findings and indications for liquids only at this time. She continues to request a [place] and cheese sandwich as she was able to eat this and keep it down a few days ago, though notably tells me she did not tolerate the protein shake she had most recently. Patient also requesting to have pain medication every 2 hours, at one point even mentioned a PCA pain pump as she had this in the past. I discussed that timing intervals for pain and nausea medication are for her safety and to prevent adverse effects. I also discussed that her current diagnosis does not warrant PCA pain pump, which is typically reserved for significant injuries/post surgical pain. She continued to reiterate that she could not continue on a full liquid diet and that she would get someone to bring her food. I did reiterate the recommendations that had previously been discussed for full liquid diet, again with indications for this based on her EGD findings but advised that if she chose not to follow our recommendations there were potential adverse effects that may occur, which is what we are hoping to avoid in keeping her on all liquids. Pt verbalized understanding.    Plan: Continue liquid diet PPI BID Avoid all NSAIDs Repeat EGD 6 weeks Ensure or boost protein shakes 2-3x/day to ensure adequate nutrition   LOS: 4 days    06/29/2021, 9:59 AM   Abem Shaddix L. Jeanmarie Hubert, MSN, APRN, AGNP-C Adult-Gerontology Nurse Practitioner [facility]

## 2021-06-29 NOTE — TOC Progression Note (Signed)
Transition of Care (TOC) - Progression Note  ? ? ?Patient Details  ?Name: Kristina Cardenas ?MRN: [id number] ?Date of Birth: April 22, 1976 ? ?Transition of Care (TOC) CM/SW Contact  ?Karn Cassis, LCSW ?Phone Number: ?06/29/2021, 1:58 PM ? ?Clinical Narrative: LCSW provided PCP list to pt. She states she no longer has [place] [employer] and has [employer]. She now lives in [place]. Pt is calling family for a ride this afternoon.    ? ? ? ?  ?Barriers to Discharge: Barriers Resolved ? ?Expected Discharge Plan and Services ?  ?  ?  ?  ?  ?Expected Discharge Date: 06/29/21               ?  ?  ?  ?  ?  ?  ?  ?  ?  ?  ? ? ?Social Determinants of Health (SDOH) Interventions ?  ? ?Readmission Risk Interventions ?No flowsheet data found. ? ?

## 2021-06-30 LAB — SURGICAL PATHOLOGY

## 2021-07-01 ENCOUNTER — Encounter (HOSPITAL_COMMUNITY): Payer: Self-pay | Admitting: Gastroenterology

## 2021-07-01 LAB — HIV ANTIBODY (ROUTINE TESTING W REFLEX): HIV Screen 4th Generation wRfx: NONREACTIVE

## 2021-07-13 ENCOUNTER — Inpatient Hospital Stay (HOSPITAL_COMMUNITY)
Admission: EM | Admit: 2021-07-13 | Discharge: 2021-07-14 | DRG: 391 | Discharge status: Against Medical Advice | Payer: Medicaid Other | Attending: Family Medicine | Admitting: Family Medicine

## 2021-07-13 ENCOUNTER — Emergency Department (HOSPITAL_COMMUNITY): Payer: Medicaid Other

## 2021-07-13 ENCOUNTER — Other Ambulatory Visit: Payer: Self-pay

## 2021-07-13 ENCOUNTER — Encounter (HOSPITAL_COMMUNITY): Payer: Self-pay

## 2021-07-13 DIAGNOSIS — D649 Anemia, unspecified: Secondary | ICD-10-CM | POA: Diagnosis present

## 2021-07-13 DIAGNOSIS — G510 Bell's palsy: Secondary | ICD-10-CM | POA: Diagnosis present

## 2021-07-13 DIAGNOSIS — R579 Shock, unspecified: Secondary | ICD-10-CM

## 2021-07-13 DIAGNOSIS — Y92009 Unspecified place in unspecified non-institutional (private) residence as the place of occurrence of the external cause: Secondary | ICD-10-CM | POA: Diagnosis not present

## 2021-07-13 DIAGNOSIS — E860 Dehydration: Secondary | ICD-10-CM | POA: Diagnosis present

## 2021-07-13 DIAGNOSIS — K2970 Gastritis, unspecified, without bleeding: Principal | ICD-10-CM | POA: Diagnosis present

## 2021-07-13 DIAGNOSIS — R109 Unspecified abdominal pain: Secondary | ICD-10-CM | POA: Diagnosis present

## 2021-07-13 DIAGNOSIS — E871 Hypo-osmolality and hyponatremia: Secondary | ICD-10-CM | POA: Diagnosis present

## 2021-07-13 DIAGNOSIS — Z79899 Other long term (current) drug therapy: Secondary | ICD-10-CM | POA: Diagnosis not present

## 2021-07-13 DIAGNOSIS — Z8673 Personal history of transient ischemic attack (TIA), and cerebral infarction without residual deficits: Secondary | ICD-10-CM | POA: Diagnosis not present

## 2021-07-13 DIAGNOSIS — E861 Hypovolemia: Secondary | ICD-10-CM | POA: Diagnosis present

## 2021-07-13 DIAGNOSIS — Z20822 Contact with and (suspected) exposure to covid-19: Secondary | ICD-10-CM | POA: Diagnosis present

## 2021-07-13 DIAGNOSIS — R112 Nausea with vomiting, unspecified: Secondary | ICD-10-CM | POA: Diagnosis present

## 2021-07-13 DIAGNOSIS — R9431 Abnormal electrocardiogram [ECG] [EKG]: Secondary | ICD-10-CM | POA: Diagnosis present

## 2021-07-13 DIAGNOSIS — E876 Hypokalemia: Secondary | ICD-10-CM | POA: Diagnosis not present

## 2021-07-13 DIAGNOSIS — E878 Other disorders of electrolyte and fluid balance, not elsewhere classified: Secondary | ICD-10-CM | POA: Diagnosis present

## 2021-07-13 DIAGNOSIS — F419 Anxiety disorder, unspecified: Secondary | ICD-10-CM | POA: Diagnosis present

## 2021-07-13 DIAGNOSIS — Z888 Allergy status to other drugs, medicaments and biological substances status: Secondary | ICD-10-CM | POA: Diagnosis not present

## 2021-07-13 DIAGNOSIS — Z882 Allergy status to sulfonamides status: Secondary | ICD-10-CM | POA: Diagnosis not present

## 2021-07-13 DIAGNOSIS — W19XXXA Unspecified fall, initial encounter: Secondary | ICD-10-CM | POA: Diagnosis present

## 2021-07-13 DIAGNOSIS — Z5329 Procedure and treatment not carried out because of patient's decision for other reasons: Secondary | ICD-10-CM | POA: Diagnosis not present

## 2021-07-13 DIAGNOSIS — K219 Gastro-esophageal reflux disease without esophagitis: Secondary | ICD-10-CM | POA: Diagnosis present

## 2021-07-13 DIAGNOSIS — Z681 Body mass index (BMI) 19 or less, adult: Secondary | ICD-10-CM

## 2021-07-13 DIAGNOSIS — N179 Acute kidney failure, unspecified: Secondary | ICD-10-CM | POA: Diagnosis present

## 2021-07-13 DIAGNOSIS — R571 Hypovolemic shock: Secondary | ICD-10-CM | POA: Diagnosis present

## 2021-07-13 DIAGNOSIS — Z8711 Personal history of peptic ulcer disease: Secondary | ICD-10-CM | POA: Diagnosis not present

## 2021-07-13 DIAGNOSIS — R1013 Epigastric pain: Secondary | ICD-10-CM | POA: Diagnosis not present

## 2021-07-13 DIAGNOSIS — R0682 Tachypnea, not elsewhere classified: Secondary | ICD-10-CM | POA: Diagnosis present

## 2021-07-13 DIAGNOSIS — Z8616 Personal history of COVID-19: Secondary | ICD-10-CM

## 2021-07-13 DIAGNOSIS — R627 Adult failure to thrive: Secondary | ICD-10-CM | POA: Diagnosis not present

## 2021-07-13 DIAGNOSIS — F17210 Nicotine dependence, cigarettes, uncomplicated: Secondary | ICD-10-CM | POA: Diagnosis present

## 2021-07-13 LAB — HCG, QUANTITATIVE, PREGNANCY
hCG, Beta Chain, Quant, S: 4 m[IU]/mL (ref <5)
hCG, Beta Chain, Quant, S: 2 m[IU]/mL (ref <5)

## 2021-07-13 LAB — COMPREHENSIVE METABOLIC PANEL
ALT: 21 U/L (ref 0–44)
AST: 30 U/L (ref 15–41)
Albumin: 4.3 g/dL (ref 3.5–5.0)
Alkaline Phosphatase: 107 U/L (ref 38–126)
BUN: 42 mg/dL — ABNORMAL HIGH (ref 6–20)
CO2: >45 mmol/L — ABNORMAL HIGH (ref 22–32)
Calcium: 9.5 mg/dL (ref 8.9–10.3)
Chloride: 56 mmol/L — CL (ref 98–111)
Creatinine, Ser: 1.9 mg/dL — ABNORMAL HIGH (ref 0.44–1.00)
GFR, Estimated: 33 mL/min — ABNORMAL LOW (ref >60)
Glucose, Bld: 135 mg/dL — ABNORMAL HIGH (ref 70–99)
Potassium: 2.1 mmol/L — CL (ref 3.5–5.1)
Sodium: 128 mmol/L — ABNORMAL LOW (ref 135–145)
Total Bilirubin: 1 mg/dL (ref 0.3–1.2)
Total Protein: 8.7 g/dL — ABNORMAL HIGH (ref 6.5–8.1)

## 2021-07-13 LAB — RESP PANEL BY RT-PCR (FLU A&B, COVID) ARPGX2
Influenza A by PCR: NEGATIVE
Influenza B by PCR: NEGATIVE
SARS Coronavirus 2 by RT PCR: NEGATIVE

## 2021-07-13 LAB — CBC WITH DIFFERENTIAL/PLATELET
Abs Immature Granulocytes: 0.02 10*3/uL (ref 0.00–0.07)
Basophils Absolute: 0 10*3/uL (ref 0.0–0.1)
Basophils Relative: 0 %
Eosinophils Absolute: 0 10*3/uL (ref 0.0–0.5)
Eosinophils Relative: 0 %
HCT: 49.2 % — ABNORMAL HIGH (ref 36.0–46.0)
Hemoglobin: 16.9 g/dL — ABNORMAL HIGH (ref 12.0–15.0)
Immature Granulocytes: 0 %
Lymphocytes Relative: 27 %
Lymphs Abs: 1.5 10*3/uL (ref 0.7–4.0)
MCH: 30.4 pg (ref 26.0–34.0)
MCHC: 34.3 g/dL (ref 30.0–36.0)
MCV: 88.5 fL (ref 80.0–100.0)
Monocytes Absolute: 0.6 10*3/uL (ref 0.1–1.0)
Monocytes Relative: 11 %
Neutro Abs: 3.3 10*3/uL (ref 1.7–7.7)
Neutrophils Relative %: 62 %
Platelets: 314 10*3/uL (ref 150–400)
RBC: 5.56 MIL/uL — ABNORMAL HIGH (ref 3.87–5.11)
RDW: 13.1 % (ref 11.5–15.5)
WBC: 5.4 10*3/uL (ref 4.0–10.5)
nRBC: 0 % (ref 0.0–0.2)

## 2021-07-13 LAB — LIPASE, BLOOD: Lipase: 33 U/L (ref 11–51)

## 2021-07-13 LAB — BASIC METABOLIC PANEL
BUN: 40 mg/dL — ABNORMAL HIGH (ref 6–20)
CO2: >45 mmol/L — ABNORMAL HIGH (ref 22–32)
Calcium: 8.3 mg/dL — ABNORMAL LOW (ref 8.9–10.3)
Chloride: 66 mmol/L — ABNORMAL LOW (ref 98–111)
Creatinine, Ser: 1.54 mg/dL — ABNORMAL HIGH (ref 0.44–1.00)
GFR, Estimated: 42 mL/min — ABNORMAL LOW (ref >60)
Glucose, Bld: 91 mg/dL (ref 70–99)
Potassium: 2.2 mmol/L — CL (ref 3.5–5.1)
Sodium: 127 mmol/L — ABNORMAL LOW (ref 135–145)

## 2021-07-13 LAB — MAGNESIUM: Magnesium: 2.2 mg/dL (ref 1.7–2.4)

## 2021-07-13 LAB — I-STAT BETA HCG BLOOD, ED (MC, WL, AP ONLY): I-stat hCG, quantitative: 19.9 m[IU]/mL — ABNORMAL HIGH (ref <5)

## 2021-07-13 MED ORDER — SODIUM CHLORIDE 0.9 % IV BOLUS
500.0000 mL | Freq: Once | INTRAVENOUS | Status: AC
  Administered 2021-07-13: 500 mL via INTRAVENOUS

## 2021-07-13 MED ORDER — SODIUM CHLORIDE 0.9 % IV SOLN: INTRAVENOUS | Status: DC

## 2021-07-13 MED ORDER — ONDANSETRON HCL 4 MG/2ML IJ SOLN
4.0000 mg | Freq: Once | INTRAMUSCULAR | Status: AC
  Administered 2021-07-13: 4 mg via INTRAVENOUS
  Filled 2021-07-13: qty 2

## 2021-07-13 MED ORDER — SODIUM CHLORIDE 0.9 % IV SOLN: 0.0000 ug/min | INTRAVENOUS | Status: DC

## 2021-07-13 MED ORDER — ACETAMINOPHEN 650 MG RE SUPP: 650.0000 mg | RECTAL | Status: DC | PRN

## 2021-07-13 MED ORDER — SODIUM CHLORIDE 0.9 % IV BOLUS
1000.0000 mL | Freq: Once | INTRAVENOUS | Status: AC
  Administered 2021-07-13: 1000 mL via INTRAVENOUS

## 2021-07-13 MED ORDER — ENOXAPARIN SODIUM 40 MG/0.4ML IJ SOSY
40.0000 mg | PREFILLED_SYRINGE | INTRAMUSCULAR | Status: DC
  Filled 2021-07-13 (×2): qty 0.4

## 2021-07-13 MED ORDER — POTASSIUM CHLORIDE 20 MEQ PO PACK
40.0000 meq | PACK | Freq: Once | ORAL | Status: AC
  Administered 2021-07-13: 40 meq via ORAL
  Filled 2021-07-13: qty 2

## 2021-07-13 MED ORDER — DIPHENHYDRAMINE HCL 50 MG/ML IJ SOLN
25.0000 mg | Freq: Once | INTRAMUSCULAR | Status: AC
  Administered 2021-07-13: 25 mg via INTRAVENOUS
  Filled 2021-07-13: qty 1

## 2021-07-13 MED ORDER — POTASSIUM CHLORIDE IN NACL 20-0.9 MEQ/L-% IV SOLN
INTRAVENOUS | Status: DC
Start: 2021-07-13 — End: 2021-07-14

## 2021-07-13 MED ORDER — ADULT MULTIVITAMIN W/MINERALS CH
1.0000 | ORAL_TABLET | Freq: Every day | ORAL | Status: DC
  Administered 2021-07-13 – 2021-07-14 (×2): 1 via ORAL
  Filled 2021-07-13 (×2): qty 1

## 2021-07-13 MED ORDER — CEFTRIAXONE SODIUM 1 G IJ SOLR
1.0000 g | INTRAMUSCULAR | Status: DC
Start: 2021-07-13 — End: 2021-07-13
  Administered 2021-07-13: 1 g via INTRAVENOUS
  Filled 2021-07-13: qty 10

## 2021-07-13 MED ORDER — POTASSIUM CHLORIDE 20 MEQ PO PACK
40.0000 meq | PACK | Freq: Once | ORAL | Status: DC
Start: 2021-07-13 — End: 2021-07-14
  Filled 2021-07-13: qty 2

## 2021-07-13 MED ORDER — METOCLOPRAMIDE HCL 5 MG/ML IJ SOLN
10.0000 mg | Freq: Once | INTRAMUSCULAR | Status: AC
  Administered 2021-07-13: 10 mg via INTRAVENOUS
  Filled 2021-07-13: qty 2

## 2021-07-13 MED ORDER — SODIUM CHLORIDE 0.9 % IV SOLN: 2.0000 g | INTRAVENOUS | Status: DC

## 2021-07-13 MED ORDER — ENSURE ENLIVE PO LIQD
237.0000 mL | Freq: Two times a day (BID) | ORAL | Status: DC
  Filled 2021-07-13 (×4): qty 237

## 2021-07-13 MED ORDER — HYDROCODONE-ACETAMINOPHEN 5-325 MG PO TABS
1.0000 | ORAL_TABLET | Freq: Four times a day (QID) | ORAL | Status: DC | PRN
  Administered 2021-07-14: 1 via ORAL
  Filled 2021-07-13: qty 1

## 2021-07-13 MED ORDER — PANTOPRAZOLE SODIUM 40 MG IV SOLR
40.0000 mg | Freq: Two times a day (BID) | INTRAVENOUS | Status: DC
  Administered 2021-07-13 – 2021-07-14 (×3): 40 mg via INTRAVENOUS
  Filled 2021-07-13 (×3): qty 10

## 2021-07-13 MED ORDER — POTASSIUM CHLORIDE 10 MEQ/100ML IV SOLN
10.0000 meq | INTRAVENOUS | Status: AC
  Administered 2021-07-13 (×6): 10 meq via INTRAVENOUS
  Filled 2021-07-13 (×6): qty 100

## 2021-07-13 MED ORDER — NOREPINEPHRINE 4 MG/250ML-% IV SOLN: 0.0000 ug/min | INTRAVENOUS | Status: DC

## 2021-07-13 MED ORDER — SODIUM CHLORIDE 0.9 % IV SOLN
250.0000 mL | INTRAVENOUS | Status: DC
Start: 2021-07-14 — End: 2021-07-14
  Administered 2021-07-14: 250 mL via INTRAVENOUS

## 2021-07-13 MED ORDER — POTASSIUM CHLORIDE 20 MEQ PO PACK
40.0000 meq | PACK | Freq: Once | ORAL | Status: DC
Start: 2021-07-13 — End: 2021-07-13

## 2021-07-13 MED ORDER — ACETAMINOPHEN 325 MG PO TABS: 650.0000 mg | ORAL_TABLET | ORAL | Status: DC | PRN

## 2021-07-13 MED ORDER — PROCHLORPERAZINE EDISYLATE 10 MG/2ML IJ SOLN
10.0000 mg | Freq: Four times a day (QID) | INTRAMUSCULAR | Status: DC | PRN
  Administered 2021-07-14: 10 mg via INTRAVENOUS
  Filled 2021-07-13: qty 2

## 2021-07-13 MED ORDER — HYDROCODONE-ACETAMINOPHEN 5-325 MG PO TABS
1.0000 | ORAL_TABLET | Freq: Four times a day (QID) | ORAL | Status: DC | PRN
  Administered 2021-07-13: 1 via ORAL
  Filled 2021-07-13: qty 1

## 2021-07-13 MED ORDER — BOOST / RESOURCE BREEZE PO LIQD CUSTOM
1.0000 | Freq: Three times a day (TID) | ORAL | Status: DC
Start: 2021-07-13 — End: 2021-07-14
  Administered 2021-07-13: 1 via ORAL

## 2021-07-13 MED ORDER — FENTANYL CITRATE PF 50 MCG/ML IJ SOSY
12.5000 ug | PREFILLED_SYRINGE | INTRAMUSCULAR | Status: DC | PRN
  Administered 2021-07-14 (×2): 12.5 ug via INTRAVENOUS
  Filled 2021-07-13 (×2): qty 1

## 2021-07-13 MED ORDER — SODIUM CHLORIDE 0.9 % IV SOLN
25.0000 ug/min | INTRAVENOUS | Status: DC
  Administered 2021-07-14: 25 ug/min via INTRAVENOUS
  Filled 2021-07-13: qty 2

## 2021-07-13 MED ORDER — PROSOURCE PLUS PO LIQD
30.0000 mL | Freq: Two times a day (BID) | ORAL | Status: DC
  Administered 2021-07-14: 30 mL via ORAL
  Filled 2021-07-13: qty 30

## 2021-07-13 NOTE — Progress Notes (Signed)
PT BP is still soft after 500 ml bolus. MD notified and order 1000 ml bolus, In and out cath for urine sample. Pt refuses to give urine sample again at this time. Nurse explained the importance of checking urine for UTI. PT still refuses.  ?

## 2021-07-13 NOTE — Assessment & Plan Note (Deleted)
Continue IV Compazine as needed °

## 2021-07-13 NOTE — Progress Notes (Signed)
Critical Result:  ?K+ 2.2, MD aware, potassium added to IVF  ?

## 2021-07-13 NOTE — Assessment & Plan Note (Signed)
K+ is 2.1 ?K+ will be replenished ?Please monitor for AM K+ for further replenishmemnt ? ?

## 2021-07-13 NOTE — ED Notes (Signed)
Wynetta Emery, MD paged and this RN spoke with them re: EKG being completed and shown to the EDP ?

## 2021-07-13 NOTE — Progress Notes (Signed)
Bladder scan showed 200+ml nurse in and out cath completed. return per care completed before and after.  ?

## 2021-07-13 NOTE — Assessment & Plan Note (Signed)
Continue with the supplements ?Dietitian will be consulted and we shall await further recommendation ?

## 2021-07-13 NOTE — Progress Notes (Signed)
?  Transition of Care (TOC) Screening Note ? ? ?Patient Details  ?Name: Kristina Cardenas ?Date of Birth: 16-Feb-1976 ? ? ?Transition of Care (TOC) CM/SW Contact:    ?Leitha Bleak, RN ?Phone Number: ?07/13/2021, 3:26 PM ? ? ? ?Transition of Care Department [facility]) has reviewed patient and no TOC needs have been identified at this time. We will continue to monitor patient advancement through interdisciplinary progression rounds. If new patient transition needs arise, please place a TOC consult. ? ? ?

## 2021-07-13 NOTE — Assessment & Plan Note (Addendum)
Patient had a fall due to the weakness ?Continue fall precaution and neurochecks ?Protein supplement to be provided ?

## 2021-07-13 NOTE — H&P (Signed)
?History and Physical  ? ? ?Patient: Kristina Cardenas SPQ:[id number] DOB: 1976/03/04 ?DOA: 07/13/2021 ?DOS: the patient was seen and examined on 07/13/2021 ?PCP: Pcp, No  ?Patient coming from: Home ? ?Chief Complaint:  ?Chief Complaint  ?Patient presents with  ? Weakness  ? ?HPI: Kristina Cardenas is a 46 y.o. female with medical history significant of stomach ulcer, GERD, Bell's palsy, history of stroke and substance abuse who presents to the emergency department due to 2-week onset of abdominal pain which has progressively worsened and associated with several days of onset of nausea and vomiting.  Abdominal pain was similar to prior episodes, she endorsed weakness and a fall due to the weakness whereby she hit the left side of her head earlier today.  She denies fever, chills, chest pain, shortness of breath, headache. ? ?ED course: ?In the emergency department, she was initially tachypneic, BP was soft at 90/73, but other vital signs were within normal range.  Work-up in the ED shows normocytic anemia, hypokalemia, hypochloremia, hyponatremia.  BUN/creatinine 42/1.90 (baseline creatinine at 0.5-1.0).,  Magnesium 2.2, lipase 33. ?Chest x-ray and CT of head without contrast showed no acute abnormalities. ?She was treated with Benadryl, Reglan and Zofran, IV hydration was provided, potassium was replenished ? ? ?Review of Systems: As mentioned in the history of present illness. All other systems reviewed and are negative. ? ?Past Medical History:  ?Diagnosis Date  ? Bell's palsy   ? COVID   ? GERD (gastroesophageal reflux disease)   ? History of stomach ulcers   ? Stroke [facility])   ? ?Past Surgical History:  ?Procedure Laterality Date  ? ABDOMINAL SURGERY    ? BIOPSY  06/28/2021  ? Procedure: BIOPSY;  Surgeon: Marguerita Merles, Reuel Boom, MD;  Location: [facility];  Service: Gastroenterology;;  ? ESOPHAGOGASTRODUODENOSCOPY (EGD) WITH PROPOFOL N/A 06/28/2021  ? Procedure: ESOPHAGOGASTRODUODENOSCOPY (EGD) WITH PROPOFOL;   Surgeon: Dolores Frame, MD;  Location: [facility] END[facility];  Service: Gastroenterology;  Laterality: N/A;  ? ?Social History:  reports that she has been smoking cigarettes. She has been smoking an average of 1 pack per day. She has never used smokeless tobacco. She reports that she does not currently use alcohol. She reports that she does not currently use drugs. ? ?Allergies  ?Allergen Reactions  ? Sulfa Antibiotics   ? Toradol [Ketorolac Tromethamine]   ? ? ?History reviewed. No pertinent family history. ? ?Prior to Admission medications   ?Medication Sig Start Date End Date Taking? Authorizing Provider  ?acetaminophen (TYLENOL) 500 MG tablet Take 2 tablets (1,000 mg total) by mouth every 6 (six) hours as needed. 06/29/21   Erick Blinks, MD  ?hydrOXYzine (ATARAX) 25 MG tablet Take 1 tablet (25 mg total) by mouth 3 (three) times daily as needed for anxiety. 06/29/21   Erick Blinks, MD  ?lidocaine (XYLOCAINE) 2 % solution Use as directed 15 mLs in the mouth or throat every 4 (four) hours as needed for mouth pain. 06/29/21   Erick Blinks, MD  ?magnesium oxide (MAG-OX) 400 (240 Mg) MG tablet Take 1 tablet (400 mg total) by mouth 2 (two) times daily. 06/29/21   Erick Blinks, MD  ?ondansetron (ZOFRAN-ODT) 4 MG disintegrating tablet Take 1 tablet (4 mg total) by mouth every 8 (eight) hours as needed for nausea or vomiting. 06/29/21   Erick Blinks, MD  ?pantoprazole (PROTONIX) 40 MG tablet Take 1 tablet (40 mg total) by mouth 2 (two) times daily. 06/29/21   Erick Blinks, MD  ?sucralfate (CARAFATE) 1 g tablet Take  1 tablet (1 g total) by mouth 4 (four) times daily. 06/29/21 06/29/22  Erick Blinks, MD  ? ? ?Physical Exam: ?Vitals:  ? 07/13/21 0530 07/13/21 0600 07/13/21 0630 07/13/21 0700  ?BP: 103/80 93/72 97/77  103/72  ?Pulse: 68 71 69 70  ?Resp: 13 (!) 26 17 15   ?Temp:      ?TempSrc:      ?SpO2: 100% 99% 100% 100%  ?Weight:      ?Height:      ? ?General: Patient was cachectic, somnolent, though easily  arousable and was able to answer questions.  She was not in any acute distress.  ?HEENT: Left eyebrow abrasion.  PERRLA. EOMI. Sclerae anicteric.  Dry mucosal membranes. ?Neck: Neck supple without lymphadenopathy. No carotid bruits. No masses palpated.  ?Cardiovascular: Regular rate with normal S1-S2 sounds. No murmurs, rubs or gallops auscultated. No JVD.  ?Respiratory: Clear breath sounds.  No accessory muscle use. ?Abdomen: Soft, nontender, nondistended. Active bowel sounds. No masses or hepatosplenomegaly  ?Skin: No rashes, lesions, or ulcerations.  Dry, warm to touch. ?Musculoskeletal:  2+ dorsalis pedis and radial pulses. Good ROM.  No contractures  ?Psychiatric: Intact judgment and insight.  Mood appropriate to current condition. ?Neurologic: No focal neurological deficits. Strength is 5/5 x 4.  CN II - XII grossly intact. ? ?Data Reviewed: ?Similar reviewed showed normal sinus rhythm at a rate of 93 bpm with QTc 508 ms ? ?Assessment and Plan: ?* Intractable nausea and vomiting ?Continue IV Compazine as needed ? ?Hyponatremia ?This is possibly secondary to dehydration ?Continue IV hydration ?Continue to monitor sodium levels with serial BMPs ? ?Acute hypokalemia ?K+ is 2.1 ?K+ will be replenished ?Please monitor for AM K+ for further replenishmemnt ? ? ?Abdominal pain ?Continue IV Protonix 40 mg twice daily ? ?Prolonged QT interval ?QTc 508 ms ?Avoid QT prolonging drugs ?Magnesium level was 2.2 ?K+ was 2.1, this was being replenished in the ED ?Repeat EKG in the morning ? ? ?Failure to thrive in adult ?Continue with the supplements ?Dietitian will be consulted and we shall await further recommendation ? ?Fall at home, initial encounter ?Patient had a fall due to the weakness ?Continue fall precaution and neurochecks ?Protein supplement to be provided ? ?Hypochloremia ?Chloride 56, this is possibly secondary to recurrent vomiting and dehydration ?Continue IV hydration and continue to monitor  BMP ? ?Dehydration ?This is probably secondary to recurrent nausea vomiting ?Continue IV hydration ? ? ? ? Advance Care Planning: CODE STATUS: Full code ? ?Consults: None ? ?Family Communication: None at bedside ? ?Severity of Illness: ?The appropriate patient status for this patient is INPATIENT. Inpatient status is judged to be reasonable and necessary in order to provide the required intensity of service to ensure the patient's safety. The patient's presenting symptoms, physical exam findings, and initial radiographic and laboratory data in the context of their chronic comorbidities is felt to place them at high risk for further clinical deterioration. Furthermore, it is not anticipated that the patient will be medically stable for discharge from the hospital within 2 midnights of admission.  ? ?* I certify that at the point of admission it is my clinical judgment that the patient will require inpatient hospital care spanning beyond 2 midnights from the point of admission due to high intensity of service, high risk for further deterioration and high frequency of surveillance required.* ? ?Author: , DO ?07/13/2021 7:25 AM ? ?For on call review [email or web address].  ?

## 2021-07-13 NOTE — Assessment & Plan Note (Signed)
Chloride 56, this is possibly secondary to recurrent vomiting and dehydration ?Continue IV hydration and continue to monitor BMP ?

## 2021-07-13 NOTE — ED Notes (Signed)
Spo2 down to 72% on room air. EDP notified. Placed pt on 3l Roland and improved to 96%. Will continue to monitor.  ?

## 2021-07-13 NOTE — Progress Notes (Signed)
Pt was assessed by this nurse and is hard to arouse pt has pinpoint pupils and BP is 80/60. PT is alert and oriented x4. Pt refuses to give urine sample at this time. Bed alarm was turned on to prevent pt getting up unassisted.  ?

## 2021-07-13 NOTE — Assessment & Plan Note (Signed)
This is possibly secondary to dehydration ?Continue IV hydration ?Continue to monitor sodium levels with serial BMPs ?

## 2021-07-13 NOTE — Progress Notes (Signed)
ASSUMPTION OF CARE NOTE  ? ?07/13/2021 ?4:37 PM ? ?Kristina Cardenas was seen and examined.  The H&P by the admitting provider, orders, imaging was reviewed.  Please see new orders.  Will continue to follow.  ? ?Vitals:  ? 07/13/21 1135 07/13/21 1234  ?BP: 94/71 98/72  ?Pulse:  72  ?Resp: 11 14  ?Temp:  98.5 ?F (36.9 ?C)  ?SpO2:  100%  ? ? ?Results for orders placed or performed during the hospital encounter of 07/13/21  ?Resp Panel by RT-PCR (Flu A&B, Covid) Nasopharyngeal Swab  ? Specimen: Nasopharyngeal Swab; Nasopharyngeal(NP) swabs in vial transport medium  ?Result Value Ref Range  ? SARS Coronavirus 2 by RT PCR NEGATIVE NEGATIVE  ? Influenza A by PCR NEGATIVE NEGATIVE  ? Influenza B by PCR NEGATIVE NEGATIVE  ?CBC with Differential/Platelet  ?Result Value Ref Range  ? WBC 5.4 4.0 - 10.5 K/uL  ? RBC 5.56 (H) 3.87 - 5.11 MIL/uL  ? Hemoglobin 16.9 (H) 12.0 - 15.0 g/dL  ? HCT 49.2 (H) 36.0 - 46.0 %  ? MCV 88.5 80.0 - 100.0 fL  ? MCH 30.4 26.0 - 34.0 pg  ? MCHC 34.3 30.0 - 36.0 g/dL  ? RDW 13.1 11.5 - 15.5 %  ? Platelets 314 150 - 400 K/uL  ? nRBC 0.0 0.0 - 0.2 %  ? Neutrophils Relative % 62 %  ? Neutro Abs 3.3 1.7 - 7.7 K/uL  ? Lymphocytes Relative 27 %  ? Lymphs Abs 1.5 0.7 - 4.0 K/uL  ? Monocytes Relative 11 %  ? Monocytes Absolute 0.6 0.1 - 1.0 K/uL  ? Eosinophils Relative 0 %  ? Eosinophils Absolute 0.0 0.0 - 0.5 K/uL  ? Basophils Relative 0 %  ? Basophils Absolute 0.0 0.0 - 0.1 K/uL  ? Immature Granulocytes 0 %  ? Abs Immature Granulocytes 0.02 0.00 - 0.07 K/uL  ?Comprehensive metabolic panel  ?Result Value Ref Range  ? Sodium 128 (L) 135 - 145 mmol/L  ? Potassium 2.1 (LL) 3.5 - 5.1 mmol/L  ? Chloride 56 (LL) 98 - 111 mmol/L  ? CO2 >45 (H) 22 - 32 mmol/L  ? Glucose, Bld 135 (H) 70 - 99 mg/dL  ? BUN 42 (H) 6 - 20 mg/dL  ? Creatinine, Ser 1.90 (H) 0.44 - 1.00 mg/dL  ? Calcium 9.5 8.9 - 10.3 mg/dL  ? Total Protein 8.7 (H) 6.5 - 8.1 g/dL  ? Albumin 4.3 3.5 - 5.0 g/dL  ? AST 30 15 - 41 U/L  ? ALT 21 0 - 44 U/L  ?  Alkaline Phosphatase 107 38 - 126 U/L  ? Total Bilirubin 1.0 0.3 - 1.2 mg/dL  ? GFR, Estimated 33 (L) >60 mL/min  ? Anion gap NOT CALCULATED 5 - 15  ?Lipase, blood  ?Result Value Ref Range  ? Lipase 33 11 - 51 U/L  ?Magnesium  ?Result Value Ref Range  ? Magnesium 2.2 1.7 - 2.4 mg/dL  ?hCG, quantitative, pregnancy  ?Result Value Ref Range  ? hCG, Beta Chain, Quant, S 4 <5 mIU/mL  ?Basic metabolic panel  ?Result Value Ref Range  ? Sodium 127 (L) 135 - 145 mmol/L  ? Potassium 2.2 (LL) 3.5 - 5.1 mmol/L  ? Chloride 66 (L) 98 - 111 mmol/L  ? CO2 >45 (H) 22 - 32 mmol/L  ? Glucose, Bld 91 70 - 99 mg/dL  ? BUN 40 (H) 6 - 20 mg/dL  ? Creatinine, Ser 1.54 (H) 0.44 - 1.00 mg/dL  ? Calcium 8.3 (L)  8.9 - 10.3 mg/dL  ? GFR, Estimated 42 (L) >60 mL/min  ? Anion gap NOT CALCULATED 5 - 15  ?hCG, quantitative, pregnancy  ?Result Value Ref Range  ? hCG, Beta Chain, Quant, S 2 <5 mIU/mL  ?I-Stat beta hCG blood, ED ([facility], [facility], [facility] only)  ?Result Value Ref Range  ? I-stat hCG, quantitative 19.9 (H) <5 mIU/mL  ? Comment 3          ? ?C. Laural Benes, MD ?[facility] ? ? 07/13/2021  4:06 AM ?How to contact the [facility] Attending or Consulting provider 7A - 7P or covering provider during after hours 7P -7A, for this patient?  ?Check the care team in [facility] and look for a) attending/consulting [facility] provider listed and b) the [facility] team listed ?Log into [email or web address] and use [facility]'s universal password to access. If you do not have the password, please contact the hospital operator. ?Locate the [facility] provider you are looking for under [facility] and page to a number that you can be directly reached. ?If you still have difficulty reaching the provider, please page the [facility] (Director on Call) for the Hospitalists listed on amion for assistance. ? ?

## 2021-07-13 NOTE — ED Notes (Signed)
Patient transported to CT 

## 2021-07-13 NOTE — Assessment & Plan Note (Addendum)
This is epigastric pain likely due to associated gastritis. ?Continue IV Protonix 40 mg twice daily ?

## 2021-07-13 NOTE — Assessment & Plan Note (Signed)
QTc 508 ms ?Avoid QT prolonging drugs ?Magnesium level was 2.2 ?K+ was 2.1, this was being replenished in the ED ?Repeat EKG in the morning ? ?

## 2021-07-13 NOTE — ED Notes (Signed)
EKG given to EDP.  

## 2021-07-13 NOTE — Progress Notes (Signed)
 bolus was given to pt, BP still soft 72/52 manual.  PT is currently trying to give urine sample before moving to ICU bed 1.  ?

## 2021-07-13 NOTE — ED Provider Notes (Signed)
?[place] ?Provider Note ? ? ?CSN: [id number] ?Arrival date & time: 07/13/21  0404 ? ?  ? ?History ? ?Chief Complaint  ?Patient presents with  ? Weakness  ? ? ?Kristina Cardenas is a 46 y.o. female. ? ?Patient presents to the emergency department for evaluation of nausea and vomiting.  Patient reports that she has had nausea and vomiting for several days.  She has not been able to eat or drink because of this.  No associated abdominal pain currently.  Patient reports a history of "stomach ulcers".  She has not had any hematemesis or melena.  Patient reports that she is extremely weak, had a fall earlier today because of the weakness.  She struck the left side of her head. ? ? ?  ? ?Home Medications ?Prior to Admission medications   ?Medication Sig Start Date End Date Taking? Authorizing Provider  ?acetaminophen (TYLENOL) 500 MG tablet Take 2 tablets (1,000 mg total) by mouth every 6 (six) hours as needed. 06/29/21  Yes Kathie Dike, MD  ?hydrOXYzine (ATARAX) 25 MG tablet Take 1 tablet (25 mg total) by mouth 3 (three) times daily as needed for anxiety. 06/29/21  Yes Kathie Dike, MD  ?ondansetron (ZOFRAN-ODT) 4 MG disintegrating tablet Take 1 tablet (4 mg total) by mouth every 8 (eight) hours as needed for nausea or vomiting. 06/29/21  Yes Kathie Dike, MD  ?pantoprazole (PROTONIX) 40 MG tablet Take 1 tablet (40 mg total) by mouth 2 (two) times daily. 06/29/21  Yes Kathie Dike, MD  ?sucralfate (CARAFATE) 1 g tablet Take 1 tablet (1 g total) by mouth 4 (four) times daily. 06/29/21 06/29/22 Yes Kathie Dike, MD  ?lidocaine (XYLOCAINE) 2 % solution Use as directed 15 mLs in the mouth or throat every 4 (four) hours as needed for mouth pain. ?Patient not taking: Reported on 07/13/2021 06/29/21   Kathie Dike, MD  ?magnesium oxide (MAG-OX) 400 (240 Mg) MG tablet Take 1 tablet (400 mg total) by mouth 2 (two) times daily. ?Patient not taking: Reported on 07/13/2021 06/29/21   Kathie Dike, MD  ?    ? ?Allergies    ?Sulfa antibiotics and Toradol [ketorolac tromethamine]   ? ?Review of Systems   ?Review of Systems ? ?Physical Exam ?Updated Vital Signs ?BP (!) 72/52   Pulse (!) 56   Temp 98 ?F (36.7 ?C)   Resp 12   Ht 5\' 8"  (1.727 m)   Wt 53.5 kg   LMP  (LMP Unknown)   SpO2 94%   BMI 17.94 kg/m?  ?Physical Exam ?Vitals and nursing note reviewed.  ?Constitutional:   ?   General: She is not in acute distress. ?   Appearance: She is well-developed.  ?HENT:  ?   Head: Normocephalic. Abrasion (left eyebrow) present.  ?   Mouth/Throat:  ?   Mouth: Mucous membranes are moist.  ?Eyes:  ?   General: Vision grossly intact. Gaze aligned appropriately.  ?   Extraocular Movements: Extraocular movements intact.  ?   Conjunctiva/sclera: Conjunctivae normal.  ?Cardiovascular:  ?   Rate and Rhythm: Normal rate and regular rhythm.  ?   Pulses: Normal pulses.  ?   Heart sounds: Normal heart sounds, S1 normal and S2 normal. No murmur heard. ?  No friction rub. No gallop.  ?Pulmonary:  ?   Effort: Pulmonary effort is normal. No respiratory distress.  ?   Breath sounds: Normal breath sounds.  ?Abdominal:  ?   General: Bowel sounds are normal.  ?  Palpations: Abdomen is soft.  ?   Tenderness: There is no abdominal tenderness. There is no guarding or rebound.  ?   Hernia: No hernia is present.  ?Musculoskeletal:     ?   General: No swelling.  ?   Cervical back: Full passive range of motion without pain, normal range of motion and neck supple. No spinous process tenderness or muscular tenderness. Normal range of motion.  ?   Right lower leg: No edema.  ?   Left lower leg: No edema.  ?Skin: ?   General: Skin is warm and dry.  ?   Capillary Refill: Capillary refill takes less than 2 seconds.  ?   Findings: No ecchymosis, erythema, rash or wound.  ?Neurological:  ?   General: No focal deficit present.  ?   Mental Status: She is alert and oriented to person, place, and time.  ?   GCS: GCS eye subscore is 4. GCS verbal subscore is  5. GCS motor subscore is 6.  ?   Cranial Nerves: Cranial nerves 2-12 are intact.  ?   Sensory: Sensation is intact.  ?   Motor: Motor function is intact.  ?   Coordination: Coordination is intact.  ?Psychiatric:     ?   Attention and Perception: Attention normal.     ?   Mood and Affect: Mood normal.     ?   Speech: Speech normal.     ?   Behavior: Behavior normal.  ? ? ?ED Results / Procedures / Treatments   ?Labs ?(all labs ordered are listed, but only abnormal results are displayed) ?Labs Reviewed  ?CBC WITH DIFFERENTIAL/PLATELET - Abnormal; Notable for the following components:  ?    Result Value  ? RBC 5.56 (*)   ? Hemoglobin 16.9 (*)   ? HCT 49.2 (*)   ? All other components within normal limits  ?COMPREHENSIVE METABOLIC PANEL - Abnormal; Notable for the following components:  ? Sodium 128 (*)   ? Potassium 2.1 (*)   ? Chloride 56 (*)   ? CO2 >45 (*)   ? Glucose, Bld 135 (*)   ? BUN 42 (*)   ? Creatinine, Ser 1.90 (*)   ? Total Protein 8.7 (*)   ? GFR, Estimated 33 (*)   ? All other components within normal limits  ?BASIC METABOLIC PANEL - Abnormal; Notable for the following components:  ? Sodium 127 (*)   ? Potassium 2.2 (*)   ? Chloride 66 (*)   ? CO2 >45 (*)   ? BUN 40 (*)   ? Creatinine, Ser 1.54 (*)   ? Calcium 8.3 (*)   ? GFR, Estimated 42 (*)   ? All other components within normal limits  ?I-STAT BETA HCG BLOOD, ED ([facility], [facility], [facility] ONLY) - Abnormal; Notable for the following components:  ? I-stat hCG, quantitative 19.9 (*)   ? All other components within normal limits  ?RESP PANEL BY RT-PCR (FLU A&B, COVID) ARPGX2  ?CULTURE, BLOOD (ROUTINE X 2)  ?CULTURE, BLOOD (ROUTINE X 2)  ?LIPASE, BLOOD  ?MAGNESIUM  ?HCG, QUANTITATIVE, PREGNANCY  ?HCG, QUANTITATIVE, PREGNANCY  ?URINALYSIS, COMPLETE (UACMP) WITH MICROSCOPIC  ?RAPID URINE DRUG SCREEN, HOSP PERFORMED  ?COMPREHENSIVE METABOLIC PANEL  ?CBC  ?APTT  ?MAGNESIUM  ?PHOSPHORUS  ? ? ?EKG ?EKG Interpretation ? ?Date/Time:  Wednesday July 13 2021 04:18:11  EDT ?Ventricular Rate:  93 ?PR Interval:  156 ?QRS Duration: 97 ?QT Interval:  408 ?QTC Calculation: 508 ?R Axis:  62 ?Text Interpretation: Sinus rhythm Right atrial enlargement Probable LVH with secondary repol abnrm Confirmed by Orpah Greek [phone]) on 07/13/2021 4:20:57 AM ? ?Radiology ?CT HEAD WO CONTRAST (5MM) ? ?Result Date: 07/13/2021 ?CLINICAL DATA:  Head trauma. Status post fall. Hit head on left side. Nausea and vomiting. EXAM: CT HEAD WITHOUT CONTRAST TECHNIQUE: Contiguous axial images were obtained from the base of the skull through the vertex without intravenous contrast. RADIATION DOSE REDUCTION: This exam was performed according to the departmental dose-optimization program which includes automated exposure control, adjustment of the mA and/or kV according to patient size and/or use of iterative reconstruction technique. COMPARISON:  None. FINDINGS: Brain: No evidence of acute infarction, hemorrhage, hydrocephalus, extra-axial collection or mass lesion/mass effect. Vascular: No hyperdense vessel or unexpected calcification. Skull: Normal. Negative for fracture or focal lesion. Sinuses/Orbits: No acute finding. Other: None IMPRESSION: 1. No acute intracranial abnormalities. Electronically Signed   By: Kerby Moors M.D.   On: 07/13/2021 05:40  ? ?DG Chest Port 1 View ? ?Result Date: 07/13/2021 ?CLINICAL DATA:  Shortness of breath. EXAM: PORTABLE CHEST 1 VIEW COMPARISON:  10/28/2020 FINDINGS: Heart size and mediastinal contours are normal. No pleural effusion or edema. No airspace opacities identified. Multiple chronic healed fracture deformities involving the right ribs appears similar to the previous exam. IMPRESSION: No acute cardiopulmonary abnormalities. Electronically Signed   By: Kerby Moors M.D.   On: 07/13/2021 05:57   ? ?Procedures ?Procedures  ? ? ?Medications Ordered in ED ?Medications  ?enoxaparin (LOVENOX) injection 40 mg (40 mg Subcutaneous Patient Refused/Not Given 07/13/21  2128)  ?prochlorperazine (COMPAZINE) injection 10 mg (has no administration in time range)  ?pantoprazole (PROTONIX) injection 40 mg (40 mg Intravenous Given 07/13/21 2226)  ?0.9 % NaCl with KCl 20 mEq/ L  infusion ( Intraven

## 2021-07-13 NOTE — ED Triage Notes (Signed)
Pt arrived from home via [facility] with after falling at home and hitting head on left side. Pt states that she has been having nausea and vomiting x4 today. Recently found out that she has stomach ulcer and has not been able to keep down any food or fluids.  ?

## 2021-07-13 NOTE — Progress Notes (Signed)
?   07/13/21 1943  ?Vitals  ?Temp 98 ?F (36.7 ?C)  ?BP (!) 80/60  ?MAP (mmHg) 67  ?BP Location Left Arm  ?BP Method Automatic  ?Patient Position (if appropriate) Sitting  ?Pulse Rate 73  ?Pulse Rate Source Monitor  ?Resp 13  ?MEWS COLOR  ?MEWS Score Color Yellow  ?Oxygen Therapy  ?SpO2 94 %  ?O2 Device Room Air  ?MEWS Score  ?MEWS Temp 0  ?MEWS Systolic 2  ?MEWS Pulse 0  ?MEWS RR 1  ?MEWS LOC 0  ?MEWS Score 3  ?Provider Notification  ?Provider Name/Title Jans.Mansly MD  ?Date Provider Notified 07/13/21  ?Time Provider Notified 2004  ?Notification Type Page  ?Notification Reason Other (Comment) ?(Yellow MEWS)  ?Provider response See new orders  ?Date of Provider Response 07/13/21  ?Time of Provider Response 2022  ? ? ?

## 2021-07-13 NOTE — Progress Notes (Addendum)
Initial Nutrition Assessment ? ?DOCUMENTATION CODES:  ? ?Severe malnutrition in context of chronic illness ? ?INTERVENTION:  ?Boost Breeze po TID, each supplement provides 250 kcal and 9 grams of protein  ? ?ProSource Plus30 ml TID (each 30 ml provides 100 kcal, 15 gr protein)  ? ?Multivitamin Daily ? ?NUTRITION DIAGNOSIS:  ? ?Severe Malnutrition related to chronic illness (hx of abdominal pain, gastric ulcer, Nausea/ vomiting and substance abuse) as evidenced by energy intake < or equal to 75% for > or equal to 1 month, percent weight loss, severe muscle depletion, moderate muscle depletion, moderate fat depletion. ? ? ?GOAL:  ?Patient will meet greater than or equal to 90% of their needs ? ? ?MONITOR:  ?PO intake, Diet advancement, Supplement acceptance, Labs, Weight trends ? ?REASON FOR ASSESSMENT:  ? ?Consult ?Assessment of nutrition requirement/status ? ?ASSESSMENT: Patient is a 46 yo female with hx of Stomach ulcer, GERD, stroke, substance abuse. Presents after falling at home. Complains with abdominal pain nausea, vomiting and weakness.  ? ?Hypokalemia, Hyponatremia and Failure to Thrive in adult.  ? ?EGD- and biopsy 2/28  ? ?Patient receiving clear liquids. Her lunch tray is here- 100% consumed- 3 servings of [place] Ice, 4 oz cranberry juice, 6 oz broth, 4 oz jello. Nausea and vomiting prior to admission. Chronic abdominal pain and hx of poor intake. Underweight.  ? ?Labs:Potassium 2.2 (L), Sodium 127 (L), BUN 40 (H), Cr. 1.54 (H).  ? ?Additional weight loss since discharge 2 weeks ago. Current wt 53.5 kg.   ?Weight at discharge on 2/27)-57.2 kg- Reflects a decrease of  6.9% < 1 month. Significant unplanned loss. ? ?NUTRITION - FOCUSED PHYSICAL EXAM: ? ?Flowsheet Row Most Recent Value  ?Orbital Region Severe depletion  ?Upper Arm Region Moderate depletion  ?Thoracic and Lumbar Region Moderate depletion  ?Buccal Region Moderate depletion  ?Temple Region Severe depletion  ?Clavicle Bone Region Severe  depletion  ?Clavicle and Acromion Bone Region Moderate depletion  ?Scapular Bone Region Moderate depletion  ?Dorsal Hand Moderate depletion  ?Patellar Region Severe depletion  ?Anterior Thigh Region Severe depletion  ?Posterior Calf Region Moderate depletion  ?Edema (RD Assessment) Mild  ?Hair Reviewed  ?Eyes Reviewed  ?Mouth Reviewed  ?Skin Reviewed  ?Nails Reviewed  ? ?  ? ? ?Diet Order:   ?Diet Order   ? ?       ?  Diet clear liquid Room service appropriate? Yes; Fluid consistency: Thin  Diet effective now       ?  ? ?  ?  ? ?  ? ? ?EDUCATION NEEDS:  ?Education needs have been addressed ? ?Skin:  Skin Assessment: Reviewed RN Assessment ? ?Last BM:  unknown ? ?Height:  ? ?Ht Readings from Last 1 Encounters:  ?07/13/21 5\' 8"  (1.727 m)  ? ? ?Weight:  ? ?Wt Readings from Last 1 Encounters:  ?07/13/21 53.5 kg  ? ? ?Ideal Body Weight:   64 kg ? ?BMI:  Body mass index is 17.94 kg/m?. ? ?Estimated Nutritional Needs:  ? ?Kcal:  1700-1900 ? ?Protein:  80-88 gr ? ?Fluid:  >1600 ml daily ? ?07/15/21 MS,RD,CSG,LDN ?Contact: [facility] ? ?

## 2021-07-13 NOTE — Assessment & Plan Note (Signed)
This is probably secondary to recurrent nausea vomiting ?Continue IV hydration ?

## 2021-07-13 NOTE — Assessment & Plan Note (Addendum)
Continue IV Compazine as needed °

## 2021-07-14 DIAGNOSIS — R579 Shock, unspecified: Secondary | ICD-10-CM

## 2021-07-14 LAB — COMPREHENSIVE METABOLIC PANEL
ALT: 13 U/L (ref 0–44)
ALT: 13 U/L (ref 0–44)
AST: 21 U/L (ref 15–41)
AST: 25 U/L (ref 15–41)
Albumin: 2.9 g/dL — ABNORMAL LOW (ref 3.5–5.0)
Albumin: 3.1 g/dL — ABNORMAL LOW (ref 3.5–5.0)
Alkaline Phosphatase: 68 U/L (ref 38–126)
Alkaline Phosphatase: 71 U/L (ref 38–126)
Anion gap: 10 (ref 5–15)
Anion gap: 12 (ref 5–15)
BUN: 30 mg/dL — ABNORMAL HIGH (ref 6–20)
BUN: 27 mg/dL — ABNORMAL HIGH (ref 6–20)
CO2: 40 mmol/L — ABNORMAL HIGH (ref 22–32)
CO2: 36 mmol/L — ABNORMAL HIGH (ref 22–32)
Calcium: 8 mg/dL — ABNORMAL LOW (ref 8.9–10.3)
Calcium: 8.1 mg/dL — ABNORMAL LOW (ref 8.9–10.3)
Chloride: 83 mmol/L — ABNORMAL LOW (ref 98–111)
Chloride: 84 mmol/L — ABNORMAL LOW (ref 98–111)
Creatinine, Ser: 1.06 mg/dL — ABNORMAL HIGH (ref 0.44–1.00)
Creatinine, Ser: 1.02 mg/dL — ABNORMAL HIGH (ref 0.44–1.00)
GFR, Estimated: >60 mL/min (ref >60)
GFR, Estimated: >60 mL/min (ref >60)
Glucose, Bld: 126 mg/dL — ABNORMAL HIGH (ref 70–99)
Glucose, Bld: 142 mg/dL — ABNORMAL HIGH (ref 70–99)
Potassium: 2.4 mmol/L — CL (ref 3.5–5.1)
Potassium: 2.7 mmol/L — CL (ref 3.5–5.1)
Sodium: 133 mmol/L — ABNORMAL LOW (ref 135–145)
Sodium: 132 mmol/L — ABNORMAL LOW (ref 135–145)
Total Bilirubin: 0.4 mg/dL (ref 0.3–1.2)
Total Bilirubin: 0.7 mg/dL (ref 0.3–1.2)
Total Protein: 5.7 g/dL — ABNORMAL LOW (ref 6.5–8.1)
Total Protein: 6.1 g/dL — ABNORMAL LOW (ref 6.5–8.1)

## 2021-07-14 LAB — MAGNESIUM
Magnesium: 2.2 mg/dL (ref 1.7–2.4)
Magnesium: 2.2 mg/dL (ref 1.7–2.4)

## 2021-07-14 LAB — URINALYSIS, COMPLETE (UACMP) WITH MICROSCOPIC
Bacteria, UA: NONE SEEN
Bilirubin Urine: NEGATIVE
Glucose, UA: NEGATIVE mg/dL
Hgb urine dipstick: NEGATIVE
Ketones, ur: NEGATIVE mg/dL
Leukocytes,Ua: NEGATIVE
Nitrite: NEGATIVE
Protein, ur: NEGATIVE mg/dL
Specific Gravity, Urine: 1.01 (ref 1.005–1.030)
pH: 7 (ref 5.0–8.0)

## 2021-07-14 LAB — RAPID URINE DRUG SCREEN, HOSP PERFORMED
Amphetamines: NOT DETECTED
Barbiturates: NOT DETECTED
Benzodiazepines: NOT DETECTED
Cocaine: POSITIVE — AB
Opiates: POSITIVE — AB
Tetrahydrocannabinol: NOT DETECTED

## 2021-07-14 LAB — APTT
aPTT: 26 seconds (ref 24–36)
aPTT: 24 seconds (ref 24–36)

## 2021-07-14 LAB — CBC WITH DIFFERENTIAL/PLATELET
Abs Immature Granulocytes: 0.01 10*3/uL (ref 0.00–0.07)
Basophils Absolute: 0 10*3/uL (ref 0.0–0.1)
Basophils Relative: 0 %
Eosinophils Absolute: 0.4 10*3/uL (ref 0.0–0.5)
Eosinophils Relative: 5 %
HCT: 38.4 % (ref 36.0–46.0)
Hemoglobin: 12.7 g/dL (ref 12.0–15.0)
Immature Granulocytes: 0 %
Lymphocytes Relative: 42 %
Lymphs Abs: 2.9 10*3/uL (ref 0.7–4.0)
MCH: 30.5 pg (ref 26.0–34.0)
MCHC: 33.1 g/dL (ref 30.0–36.0)
MCV: 92.3 fL (ref 80.0–100.0)
Monocytes Absolute: 0.9 10*3/uL (ref 0.1–1.0)
Monocytes Relative: 13 %
Neutro Abs: 2.8 10*3/uL (ref 1.7–7.7)
Neutrophils Relative %: 40 %
Platelets: 244 10*3/uL (ref 150–400)
RBC: 4.16 MIL/uL (ref 3.87–5.11)
RDW: 13.2 % (ref 11.5–15.5)
WBC: 6.9 10*3/uL (ref 4.0–10.5)
nRBC: 0 % (ref 0.0–0.2)

## 2021-07-14 LAB — LACTIC ACID, PLASMA
Lactic Acid, Venous: 1.6 mmol/L (ref 0.5–1.9)
Lactic Acid, Venous: 2 mmol/L (ref 0.5–1.9)
Lactic Acid, Venous: 1.9 mmol/L (ref 0.5–1.9)

## 2021-07-14 LAB — PROTIME-INR
INR: 1 (ref 0.8–1.2)
Prothrombin Time: 13.3 seconds (ref 11.4–15.2)

## 2021-07-14 LAB — CORTISOL: Cortisol, Plasma: 6.9 ug/dL

## 2021-07-14 LAB — PHOSPHORUS: Phosphorus: 1.7 mg/dL — ABNORMAL LOW (ref 2.5–4.6)

## 2021-07-14 MED ORDER — VANCOMYCIN HCL IN DEXTROSE 1-5 GM/200ML-% IV SOLN
1000.0000 mg | Freq: Once | INTRAVENOUS | Status: AC
  Administered 2021-07-14: 1000 mg via INTRAVENOUS
  Filled 2021-07-14: qty 200

## 2021-07-14 MED ORDER — CHLORHEXIDINE GLUCONATE CLOTH 2 % EX PADS: 6.0000 | MEDICATED_PAD | Freq: Every day | CUTANEOUS | Status: DC

## 2021-07-14 MED ORDER — PHENYLEPHRINE HCL-NACL 20-0.9 MG/250ML-% IV SOLN
25.0000 ug/min | INTRAVENOUS | Status: DC
  Administered 2021-07-14: 60 ug/min via INTRAVENOUS
  Administered 2021-07-14: 50 ug/min via INTRAVENOUS
  Filled 2021-07-14 (×2): qty 250

## 2021-07-14 MED ORDER — METRONIDAZOLE 500 MG/100ML IV SOLN
500.0000 mg | Freq: Two times a day (BID) | INTRAVENOUS | Status: DC
  Administered 2021-07-14: 500 mg via INTRAVENOUS
  Filled 2021-07-14: qty 100

## 2021-07-14 MED ORDER — POTASSIUM CHLORIDE 10 MEQ/100ML IV SOLN
10.0000 meq | INTRAVENOUS | Status: AC
  Administered 2021-07-14 (×4): 10 meq via INTRAVENOUS
  Filled 2021-07-14 (×4): qty 100

## 2021-07-14 MED ORDER — HYDROCORTISONE SOD SUC (PF) 100 MG IJ SOLR
100.0000 mg | Freq: Once | INTRAMUSCULAR | Status: AC
  Administered 2021-07-14: 100 mg via INTRAVENOUS
  Filled 2021-07-14: qty 2

## 2021-07-14 MED ORDER — POTASSIUM PHOSPHATES 15 MMOLE/5ML IV SOLN
15.0000 mmol | Freq: Once | INTRAVENOUS | Status: AC
  Administered 2021-07-14: 15 mmol via INTRAVENOUS
  Filled 2021-07-14: qty 5

## 2021-07-14 MED ORDER — VANCOMYCIN HCL 750 MG/150ML IV SOLN: 750.0000 mg | INTRAVENOUS | Status: DC

## 2021-07-14 NOTE — Progress Notes (Addendum)
Pharmacy Antibiotic Note ? ?Kristina Cardenas is a 46 y.o. female admitted on 07/13/2021 with sepsis from unknown source.  Pharmacy has been consulted for vancomycin dosing. ? ?WBC 5.4, Scr unstable at 1.54 previously at 1.90 this admission, baseline 0.5-0.7. ?Temp (24hrs), Avg:98.1 ?F (36.7 ?C), Min:97.6 ?F (36.4 ?C), Max:98.5 ?F (36.9 ?C) ? ?Plan: ?Vancomycin 1000 mg IV x 1 loading dose ?Vancomycin 750 mg IV Q 24 hrs. Goal AUC 400-550. ?Expected AUC: 530 ?SCr used: 1.54 ?Monitor Scr and dose by levels ? ? ?Height: 5\' 8"  (172.7 cm) ?Weight: 53.5 kg (118 lb) ?IBW/kg (Calculated) : 63.9 ? ?Recent Labs  ?Lab 07/13/21 ?0443 07/13/21 ?1201  ?WBC 5.4  --   ?CREATININE 1.90* 1.54*  ?  ?Estimated Creatinine Clearance: 38.6 mL/min (A) (by C-G formula based on SCr of 1.54 mg/dL (H)).   ? ?Allergies  ?Allergen Reactions  ? Sulfa Antibiotics   ? Toradol [Ketorolac Tromethamine]   ? ? ?Antimicrobials this admission: ?Vancomycin 3/16 >> ?Ceftriaxone 3/16 >> ?Metronidazole 3/16 >> ? ?Thank you for allowing pharmacy to be a part of this patient?s care. ? ?4/16, PharmD Candidate [phone] ?07/14/2021 2:24 AM ? ?

## 2021-07-14 NOTE — Hospital Course (Signed)
46 y.o. female with medical history significant of stomach ulcer, GERD, Bell's palsy, history of stroke and substance abuse who presents to the emergency department due to 2-week onset of abdominal pain which has progressively worsened and associated with several days of onset of nausea and vomiting.  Abdominal pain was similar to prior episodes, she endorsed weakness and a fall due to the weakness whereby she hit the left side of her head earlier today.  She denies fever, chills, chest pain, shortness of breath, headache. ? ?ED course: ?In the emergency department, she was initially tachypneic, BP was soft at 90/73, but other vital signs were within normal range.  Work-up in the ED shows normocytic anemia, hypokalemia, hypochloremia, hyponatremia.  BUN/creatinine 42/1.90 (baseline creatinine at 0.5-1.0).,  Magnesium 2.2, lipase 33. ?Chest x-ray and CT of head without contrast showed no acute abnormalities. ?She was treated with Benadryl, Reglan and Zofran, IV hydration was provided, potassium was replenished ?  ? ?07/14/2021:  Pt leaving hospital against medical advice.  I came to counsel with her and did a full capacity exam and she does have capacity to make her own medical decisions and adamant that she is going home and I could not convince her to stay longer.  She verbalized understanding of the risks of leaving AMA including further decline disability and death and she is deciding to leave.  I told her that she can return to ER at any time.   ?

## 2021-07-14 NOTE — Progress Notes (Signed)
Pt is currently on 68mcg/min Phenylephrine. This RN went in to attempt insertion of USG IV per vasopressor infusion protocol. However, patient stated she feels she had good IV's now and does not want to be stuck again. Educated patient on risk of phenyleophrine infusing through a smaller, regular IV, with higher risk of infiltration. Also explained benefits of obtaining an USG IV. Patient still declined to for another IV to be placed. Elease Hashimoto, RN notified. ?

## 2021-07-14 NOTE — Assessment & Plan Note (Signed)
-   Differential diagnosis would include hypovolemic and septic shock. ?- The patient was placed on aggressive hydration with IV normal saline. ?- We started her on IV Neo-Synephrine.  Levophed was briefly held off given prolonged QT interval. ?- We will empirically place her on IV cefepime and vancomycin. ?- We will follow blood cultures ?- 1 dose of IV Solu-Cortef 100 mg was ordered. ?

## 2021-07-14 NOTE — Progress Notes (Signed)
Patient requested to leave AMA and utilized her cell phone to reach out to a personal acquaintance for a ride home.  Patient informed of risk to leaving and benefits of staying inpatient by both MD and [facility].  Patient still requested to leave AMA.  Patient on pressor at time of AMA.  Patient alert and oriented x 4 and signed the AMA form.  Patient given personal belongings and IVs removed without incident.  Patient dressed self and left unit with personal acquaintance. Charge RN aware.  ?

## 2021-07-14 NOTE — Progress Notes (Signed)
eLink Physician-Brief Progress Note ?Patient Name: Kristina Cardenas ?DOB: 1975/05/11 ?MRN: [id number] ? ? ?Date of Service ? 07/14/2021  ?HPI/Events of Note ? Patient admitted with intractable nausea and vomiting, possibly secondary to acute gastritis, hypovolemia, acute kidney injury, and profound hypokalemia.  ?eICU Interventions ? New Patient Evaluation.  ? ? ? ?  ? ?Migdalia Dk ?07/14/2021, 1:41 AM ?

## 2021-07-14 NOTE — Progress Notes (Addendum)
?Progress Note ? ?Critical care note: ? ?Date of note: 07/13/2021 ? ?Patient: Kristina Cardenas [id number] DOB: Dec 20, 1975 DOA: 07/13/2021     1 ?DOS: the patient was seen and examined on 07/13/2021 ? ?Subjective:  ? ?Brief hospital course: ? Kristina Cardenas is a 46 y.o. female with medical history significant of stomach ulcer, GERD, Bell's palsy, history of stroke and substance abuse who presents to the emergency department due to 2-week onset of abdominal pain which has progressively worsened and associated with several days of onset of nausea and vomiting.  Abdominal pain was similar to prior episodes, she endorsed weakness and a fall due to the weakness whereby she hit the left side of her head earlier today.  She denies fever, chills, chest pain, shortness of breath, headache. ? ?ED course: ?In the emergency department, she was initially tachypneic, BP was soft at 90/73, but other vital signs were within normal range.  Work-up in the ED shows normocytic anemia, hypokalemia, hypochloremia, hyponatremia.  BUN/creatinine 42/1.90 (baseline creatinine at 0.5-1.0).,  Magnesium 2.2, lipase 33. ?Chest x-ray and CT of head without contrast showed no acute abnormalities. ?She was treated with Benadryl, Reglan and Zofran, IV hydration was provided, potassium was replenished ? ?Floor course: ?The patient continued to be hypotensive despite aggressive hydration with 1.5 L of IV normal saline bolus beside other fluids given earlier.  She continued to have a map below 60.  She was complaining of mild epigastric pain felt as heartburn.  No chest pain or palpitations.  No cough or wheezing or dyspnea or hemoptysis.  No nausea or vomiting or diarrhea.  No dysuria, urinary frequency or urgency or flank pain.  She could not urinate despite IV fluids.  I and O catheter was placed and a urinalysis was obtained. ? ?Objective: ? ?Physical Exam: ?Vitals:  ? 07/13/21 2055 07/13/21 2225 07/13/21 2311 07/14/21 0044  ?BP: (!) 72/46 (!)  71/52 (!) 72/52 (!) 100/48  ?Pulse: (!) 57 (!) 56  63  ?Resp:  12  (!) 21  ?Temp:    98.5 ?F (36.9 ?C)  ?TempSrc:    Oral  ?SpO2:      ?Weight:      ?Height:      ? ?Generally: Acutely ill middle-aged Caucasian female in no respiratory distress. ?Head - atraumatic, normocephalic.  ?Pupils - equal, round and reactive to light and accommodation. Extraocular movements are intact. No scleral icterus.  ?Oropharynx - moist mucous membranes and tongue. No pharyngeal erythema or exudate.  ?Neck - supple. No JVD. Carotid pulses 2+ bilaterally. No carotid bruits. No palpable thyromegaly or lymphadenopathy. ?Cardiovascular - regular rate and rhythm. Normal S1 and S2. No murmurs, gallops or rubs.  ?Lungs - clear to auscultation bilaterally.  ?Abdomen - soft with mild epigastric tenderness without rebound tenderness guarding or rigidity.Marland Kitchen Positive bowel sounds. No palpable organomegaly or masses.  ?Extremities - no pitting edema, clubbing or cyanosis.  ?Neuro - grossly non-focal. ?Skin - no rashes. ?Breast, pelvic and rectal - deferred. ? ?All labs were reviewed. ? ?Urinalysis came back negative. ?Urine drug screen came back positive for opiates and cocaine. ? ?Assessment and Plan: ?Shock (HCC) ?- Differential diagnosis would include hypovolemic and septic shock. ?- The patient was placed on aggressive hydration with IV normal saline. ?- We started her on IV Neo-Synephrine.  Levophed was briefly held off given prolonged QT interval. ?- We will empirically place her on IV cefepime and vancomycin. ?- We will follow blood cultures ?- 1 dose of IV Solu-Cortef 100  mg was ordered. ? ?Intractable nausea and vomiting ?Continue IV Compazine as needed ? ?Hyponatremia ?This is possibly secondary to dehydration ?Continue IV hydration ?Continue to monitor sodium levels with serial BMPs ? ?Acute hypokalemia ?K+ is 2.1 ?K+ will be replenished ?Please monitor for AM K+ for further replenishmemnt ? ? ?Abdominal pain ?This is epigastric pain  likely due to associated gastritis. ?Continue IV Protonix 40 mg twice daily ? ?Prolonged QT interval ?QTc 508 ms ?Avoid QT prolonging drugs ?Magnesium level was 2.2 ?K+ was 2.1, this was being replenished in the ED ?Repeat EKG in the morning ? ? ?Failure to thrive in adult ?Continue with the supplements ?Dietitian will be consulted and we shall await further recommendation ? ?Fall at home, initial encounter ?Patient had a fall due to the weakness ?Continue fall precaution and neurochecks ?Protein supplement to be provided ? ?Hypochloremia ?Chloride 56, this is possibly secondary to recurrent vomiting and dehydration ?Continue IV hydration and continue to monitor BMP ? ?Dehydration ?This is probably secondary to recurrent nausea vomiting ?Continue IV hydration ? ? ? ?Authorized and performed by: Valente David, MD ?Total critical care time: Approximately 40  minutes. ?Due to a high probability of clinically significant, life-threatening deterioration, the patient required my highest level of preparedness to intervene emergently and I personally spent this critical care time directly and personally managing the patient.  This critical care time included obtaining a history, examining the patient, pulse oximetry, ordering and review of studies, arranging urgent treatment with development of management plan, evaluation of patient's response to treatment, frequent reassessment, and discussions with other providers. ?This critical care time was performed to assess and manage the high probability of imminent, life-threatening deterioration that could result in multiorgan failure.  It was exclusive of separately billable procedures and treating other patients and teaching time. ?  ?  ? ?Author: ?Hannah Beat, MD ?07/14/2021 2:03 AM ? ?For on call review [email or web address].  ?

## 2021-07-14 NOTE — Progress Notes (Addendum)
Writer, patient RN and [facility] went in to patient room and asked patient if it was ok for patient health safety if we go through patient belongings to make sure we provide a safe environment for her. Patient alert and oriented. Patient agreeable (gave [place] permission) to let us go through pockets of jacket and belonging in bags that was in the room and jeans patient was currently wearing. Belongings we went through with patient present and in was able to see everything being done. Multiple loose pills (colors of pills included blue, white, yellow and orange) found in pockets of jacket along with loose silver colored jewelry, cigarette pack and lighters. Several empty opened Buspar transdermal patches also found. All belongings placed in new bags in front of patient and bags labeled and placed/stored in secured room at this time. Security guard was present on unit, outside room if needed. Dr Wynetta Emery aware and updated. ?

## 2021-07-14 NOTE — Discharge Summary (Signed)
Physician Discharge Summary  Kristina Buzzetta LKG:[id number] DOB: 1975/12/20 DOA: 07/13/2021  Admit date: 07/13/2021 Discharge date: 07/14/2021  PATIENT DISCHARGED AGAINST MEDICAL ADVICE AND HIGH RISK FOR READMISSION  Brief Hospitalization Summary: Please see all hospital notes, images, labs for full details of the hospitalization. 46 y.o. female with medical history significant of stomach ulcer, GERD, Bell's palsy, history of stroke and substance abuse who presents to the emergency department due to 2-week onset of abdominal pain which has progressively worsened and associated with several days of onset of nausea and vomiting.  Abdominal pain was similar to prior episodes, she endorsed weakness and a fall due to the weakness whereby she hit the left side of her head earlier today.  She denies fever, chills, chest pain, shortness of breath, headache.  ED course: In the emergency department, she was initially tachypneic, BP was soft at 90/73, but other vital signs were within normal range.  Work-up in the ED shows normocytic anemia, hypokalemia, hypochloremia, hyponatremia.  BUN/creatinine 42/1.90 (baseline creatinine at 0.5-1.0).,  Magnesium 2.2, lipase 33. Chest x-ray and CT of head without contrast showed no acute abnormalities. She was treated with Benadryl, Reglan and Zofran, IV hydration was provided, potassium was replenished    07/14/2021:  Pt leaving hospital against medical advice.  I came to counsel with her and did a full capacity exam and she does have capacity to make her own medical decisions and adamant that she is going home and I could not convince her to stay longer.  She verbalized understanding of the risks of leaving AMA including further decline disability and death and she is deciding to leave.  I told her that she can return to ER at any time.     Discharge Diagnoses:  Principal Problem:   Intractable nausea and vomiting Active Problems:   Acute hypokalemia   Abdominal  pain   Dehydration   Hyponatremia   Hypochloremia   Fall at home, initial encounter   Failure to thrive in adult   Prolonged QT interval   Shock (HCC)   Discharge Instructions:  Procedures/Studies: CT ABDOMEN PELVIS WO CONTRAST  Result Date: 06/25/2021 CLINICAL DATA:  Abdominal pain EXAM: CT ABDOMEN AND PELVIS WITHOUT CONTRAST TECHNIQUE: Multidetector CT imaging of the abdomen and pelvis was performed following the standard protocol without IV contrast. RADIATION DOSE REDUCTION: This exam was performed according to the departmental dose-optimization program which includes automated exposure control, adjustment of the mA and/or kV according to patient size and/or use of iterative reconstruction technique. COMPARISON:  12/24/2020 FINDINGS: Lower chest: Visualized lower lung fields are clear. There are pockets of air in the wall of the lower thoracic esophagus. Hepatobiliary: There is air in the lumen of intrahepatic and extrahepatic bile ducts. There is air in the lumen of gallbladder. Pancreas: No focal abnormality is seen. Spleen: Unremarkable. Adrenals/Urinary Tract: Adrenals are not enlarged. There is possible minimal calcification in the right adrenal. There is no hydronephrosis. There are few small right renal stones each measuring less than 3 mm. Ureters are not dilated. Urinary bladder is unremarkable. Stomach/Bowel: Stomach is moderately distended with air-fluid level. There is no significant small bowel dilation. Appendix is not dilated. There is no significant wall thickening in the colon. There is no pericolic stranding or fluid collection. Vascular/Lymphatic: Scattered arterial calcifications are seen. Reproductive: Uterus is unremarkable. There are no dominant adnexal masses. Other: There is no ascites or pneumoperitoneum. Musculoskeletal: There is first-degree anterolisthesis at L4-L5 level. Spondylolysis is seen in the L5 vertebra. Spinal stenosis  and encroachment of neural foramina is  seen at multiple levels in the lumbar spine. IMPRESSION: There is no evidence of intestinal obstruction or pneumoperitoneum. Appendix is not dilated. There is no hydronephrosis. There is air in the lumen of bile ducts and gallbladder possibly suggesting previous sphincterotomy. There is no significant dilation of bile ducts. There are few small right renal stones. Lumbar spondylosis with spinal stenosis and encroachment of neural foramina at multiple levels. Spondylolysis is seen in the L5 vertebra with first-degree spondylolisthesis at L5-S1 level. There are small pockets of air in the wall of the visualized lower thoracic esophagus close to the gastroesophageal junction. Significance of this pneumatosis is not clearly evident. This may be a benign process or suggest inflammatory or infectious process. Electronically Signed   By: Ernie Avena M.D.   On: 06/25/2021 15:41   CT HEAD WO CONTRAST ( )  Result Date: 07/13/2021 CLINICAL DATA:  Head trauma. Status post fall. Hit head on left side. Nausea and vomiting. EXAM: CT HEAD WITHOUT CONTRAST TECHNIQUE: Contiguous axial images were obtained from the base of the skull through the vertex without intravenous contrast. RADIATION DOSE REDUCTION: This exam was performed according to the departmental dose-optimization program which includes automated exposure control, adjustment of the mA and/or kV according to patient size and/or use of iterative reconstruction technique. COMPARISON:  None. FINDINGS: Brain: No evidence of acute infarction, hemorrhage, hydrocephalus, extra-axial collection or mass lesion/mass effect. Vascular: No hyperdense vessel or unexpected calcification. Skull: Normal. Negative for fracture or focal lesion. Sinuses/Orbits: No acute finding. Other: None IMPRESSION: 1. No acute intracranial abnormalities. Electronically Signed   By: Signa Kell M.D.   On: 07/13/2021 05:40   CT ABDOMEN PELVIS W CONTRAST  Result Date: 06/28/2021 CLINICAL  DATA:  46 year old female with abdominal pain, nausea vomiting. Pneumobilia with no definite prior sphincterotomy per the EMR. But history of GI bleeding due to duodenal ulcer in 2017 treated with endoscopy at that time. EXAM: CT ABDOMEN AND PELVIS WITH CONTRAST TECHNIQUE: Multidetector CT imaging of the abdomen and pelvis was performed using the standard protocol following bolus administration of intravenous contrast. RADIATION DOSE REDUCTION: This exam was performed according to the departmental dose-optimization program which includes automated exposure control, adjustment of the mA and/or kV according to patient size and/or use of iterative reconstruction technique. CONTRAST:  OMNIPAQUE IOHEXOL 300 MG/ML  SOLN COMPARISON:  Noncontrast CT Abdomen and Pelvis 06/25/2021. CT with contrast 12/24/2020. FINDINGS: Lower chest: Lung bases are clear aside from trace fluid or atelectasis in the posterior right costophrenic angle. No pericardial effusion. Hepatobiliary: Pneumobilia redemonstrated, stable from the recent noncontrast CT. New from last year. Gallbladder appears partially contracted with mild wall thickening and enhancement unchanged from last year. No convincing gallstone or choledocholithiasis. The CBD appears to taper distally. Liver enhancement remains within normal limits. Pancreas: Negative. Spleen: Negative. Adrenals/Urinary Tract: Chronic partially calcified adrenal gland could be postinflammatory or posttraumatic. Left adrenal remains normal. No discrete adrenal nodule. Small round dystrophic calcification in more since pouch on the right is stable since 2021 and appears inconsequential. Nonobstructed kidneys with symmetric renal enhancement and contrast excretion. No nephrolithiasis or pararenal inflammation. Ureters appear decompressed. Unremarkable bladder. Occasional pelvic phleboliths. Stomach/Bowel: Retained gas and low-density stool in redundant large bowel from the splenic flexure  distally. Less transverse colon retained stool. Decompressed hepatic flexure today. Somewhat redundant right colon, and the cecum is in the midline of the lower abdomen. The appendix remains normal on series 5, image 36. No convincing large bowel  inflammation. Decompressed terminal ileum. Oral contrast has not quite reached the TI. No dilated small bowel elsewhere. Stomach is distended with air and fluid with possible generalized gastric mucosal hyperenhancement. Indistinct appearance of the gastric antrum but improved from the CTs last year. No perforated gastric ulcer identified. Decompressed duodenal bulb. Mostly decompressed duodenum bulb and C loop. No free air. No abdominal free fluid. Vascular/Lymphatic: Advanced Aortoiliac calcified atherosclerosis. Major arterial structures remain patent. 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 No lymphadenopathy identified. Reproductive: Within normal limits. Other: Trace free fluid in the pelvis with simple fluid density. 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 Advanced disc degeneration there with vacuum disc. No acute osseous abnormality identified. IMPRESSION: 1. Pneumobilia appears unchanged from 3 days ago, new since last year. Partially contracted gallbladder with mild chronic wall thickening. No bile duct obstructing etiology by CT. 2. Patulous stomach with mucosal hyperenhancement. Consider Acute Gastritis. Inflammation in the region of the gastric antrum has regressed since last year, with no evidence of perforated ulcer at this time. 3. Trace free fluid in the pelvis is nonspecific but may be physiologic. 4. No other acute or inflammatory process identified in the abdomen or pelvis. Advanced Aortic Atherosclerosis (ICD10-I70.0). Redundant large bowel with retained stool. Normal appendix in the midline. Electronically Signed    By: Odessa Fleming M.D.   On: 06/28/2021 08:35   DG Chest Port 1 View  Result Date: 07/13/2021 CLINICAL DATA:  Shortness of breath. EXAM: PORTABLE CHEST 1 VIEW COMPARISON:  10/28/2020 FINDINGS: Heart size and mediastinal contours are normal. No pleural effusion or edema. No airspace opacities identified. Multiple chronic healed fracture deformities involving the right ribs appears similar to the previous exam. IMPRESSION: No acute cardiopulmonary abnormalities. Electronically Signed   By: Signa Kell M.D.   On: 07/13/2021 05:57     Subjective: Pt is upset with staff and signing out AMA, she says that she is not going to stay and I could not convince her to continue to receive treatment.   Discharge Exam: Vitals:   07/14/21 1145 07/14/21 1215  BP: (!) 101/48 118/69  Pulse: 63 63  Resp: 16 16  Temp:    SpO2: 100% 100%   Vitals:   07/14/21 1134 07/14/21 1135 07/14/21 1145 07/14/21 1215  BP:   (!) 101/48 118/69  Pulse:  99 63 63  Resp:   16 16  Temp: 98.2 F (36.8 C)     TempSrc: Axillary     SpO2:   100% 100%  Weight:      Height:        General: Pt is alert, awake, oriented x 4, not in acute distress Cardiovascular: normal S1/S2 +, no rubs, no gallops Respiratory: CTA bilaterally, no wheezing, no rhonchi Abdominal: Soft, NT, ND, bowel sounds + Extremities: no edema, no cyanosis   The results of significant diagnostics from this hospitalization (including imaging, microbiology, ancillary and laboratory) are listed below for reference.     Microbiology: Recent Results (from the past 240 hour(s))  Resp Panel by RT-PCR (Flu A&B, Covid) Nasopharyngeal Swab     Status: None   Collection Time: 07/13/21 12:39 PM   Specimen: Nasopharyngeal Swab; Nasopharyngeal(NP) swabs in vial transport medium  Result Value Ref Range Status   SARS Coronavirus 2 by RT PCR NEGATIVE NEGATIVE Final    Comment: (NOTE) SARS-CoV-2 target nucleic acids are NOT DETECTED.  The SARS-CoV-2 RNA is generally  detectable in upper respiratory specimens during the acute phase of infection. The lowest concentration of SARS-CoV-2 viral copies this assay can detect is 138 copies/mL. A negative result does not preclude SARS-Cov-2 infection and should not be used as the sole basis for treatment or other patient management decisions. A negative result may occur with  improper specimen collection/handling, submission of specimen other than nasopharyngeal swab, presence of viral mutation(s) within the areas targeted by this assay, and inadequate number of viral copies(<138 copies/mL). A negative result must be combined with clinical observations, patient history, and epidemiological information. The expected result is Negative.  Fact Sheet for Patients:  BloggerCourse.com  Fact Sheet for Healthcare Providers:  SeriousBroker.it  This test is no t yet approved or cleared by the [place] FDA and  has been authorized for detection and/or diagnosis of SARS-CoV-2 by FDA under an Emergency Use Authorization (EUA). This EUA will remain  in effect (meaning this test can be used) for the duration of the COVID-19 declaration under Section 564(b)(1) of the Act, 21 U.S.C.section 360bbb-3(b)(1), unless the authorization is terminated  or revoked sooner.       Influenza A by PCR NEGATIVE NEGATIVE Final   Influenza B by PCR NEGATIVE NEGATIVE Final    Comment: (NOTE) The Xpert Xpress SARS-CoV-2/FLU/RSV plus assay is intended as an aid in the diagnosis of influenza from Nasopharyngeal swab specimens and should not be used as a sole basis for treatment. Nasal washings and aspirates are unacceptable for Xpert Xpress SARS-CoV-2/FLU/RSV testing.  Fact Sheet for Patients: BloggerCourse.com  Fact Sheet for Healthcare Providers: SeriousBroker.it  This test is not yet approved or cleared by the [place] FDA  and has been authorized for detection and/or diagnosis of SARS-CoV-2 by FDA under an Emergency Use Authorization (EUA). This EUA will remain in effect (meaning this test can be used) for the duration of the COVID-19 declaration under Section 564(b)(1) of the Act, 21 U.S.C. section 360bbb-3(b)(1), unless the authorization is terminated or revoked.  Performed at [facility], [address] [place], [place] [address]   Culture, blood (routine x 2)     Status: None (Preliminary result)   Collection Time: 07/13/21 11:23 PM   Specimen: Left Antecubital; Blood  Result Value Ref Range Status   Specimen Description LEFT ANTECUBITAL  Final   Special Requests   Final    BOTTLES DRAWN AEROBIC AND ANAEROBIC Blood Culture adequate volume   Culture   Final    NO GROWTH < 12 HOURS Performed at [facility], [address] [place], [place] [address]    Report Status PENDING  Incomplete  Culture, blood (routine x 2)     Status: None (Preliminary result)   Collection Time: 07/14/21  1:06 AM   Specimen: BLOOD RIGHT HAND  Result Value Ref Range Status   Specimen Description BLOOD RIGHT HAND  Final   Special Requests   Final    BOTTLES DRAWN AEROBIC AND ANAEROBIC Blood Culture adequate volume   Culture   Final    NO GROWTH < 12 HOURS Performed at [facility], [address] [place], [place] [address]    Report Status PENDING  Incomplete     Labs: BNP (last 3 results) No results for input(s): BNP in the last 8760 hours. Basic Metabolic Panel: Recent Labs  Lab 07/13/21 0443 07/13/21 1201 07/14/21 0223 07/14/21 0433  NA 128* 127* 133* 132*  K 2.1* 2.2* 2.4* 2.7*  CL 56* 66* 83* 84*  CO2 >45* >45* 40* 36*  GLUCOSE 135* 91 126* 142*  BUN 42* 40* 30* 27*  CREATININE 1.90* 1.54* 1.06* 1.02*  CALCIUM 9.5 8.3* 8.0* 8.1*  MG 2.2  --  2.2 2.2  PHOS  --   --   --  1.7*   Liver Function Tests: Recent Labs  Lab 07/13/21 0443 07/14/21 0223 07/14/21 0433  AST 30 21 25   ALT 21 13 13    ALKPHOS 107 68 71  BILITOT 1.0 0.4 0.7  PROT 8.7* 5.7* 6.1*  ALBUMIN 4.3 2.9* 3.1*   Recent Labs  Lab 07/13/21 0443  LIPASE 33   No results for input(s): AMMONIA in the last 168 hours. CBC: Recent Labs  Lab 07/13/21 0443 07/14/21 0223  WBC 5.4 6.9  NEUTROABS 3.3 2.8  HGB 16.9* 12.7  HCT 49.2* 38.4  MCV 88.5 92.3  PLT 314 244   Cardiac Enzymes: No results for input(s): CKTOTAL, CKMB, CKMBINDEX, TROPONINI in the last 168 hours. BNP: Invalid input(s): POCBNP CBG: No results for input(s): GLUCAP in the last 168 hours. D-Dimer No results for input(s): DDIMER in the last 72 hours. Hgb A1c No results for input(s): HGBA1C in the last 72 hours. Lipid Profile No results for input(s): CHOL, HDL, LDLCALC, TRIG, CHOLHDL, LDLDIRECT in the last 72 hours. Thyroid function studies No results for input(s): TSH, T4TOTAL, T3FREE, THYROIDAB in the last 72 hours.  Invalid input(s): FREET3 Anemia work up No results for input(s): VITAMINB12, FOLATE, FERRITIN, TIBC, IRON, RETICCTPCT in the last 72 hours. Urinalysis    Component Value Date/Time   COLORURINE YELLOW 07/13/2021 2352   APPEARANCEUR CLEAR 07/13/2021 2352   LABSPEC 1.010 07/13/2021 2352   PHURINE 7.0 07/13/2021 2352   GLUCOSEU NEGATIVE 07/13/2021 2352   HGBUR NEGATIVE 07/13/2021 2352   BILIRUBINUR NEGATIVE 07/13/2021 2352   KETONESUR NEGATIVE 07/13/2021 2352   PROTEINUR NEGATIVE 07/13/2021 2352   NITRITE NEGATIVE 07/13/2021 2352   LEUKOCYTESUR NEGATIVE 07/13/2021 2352   Sepsis Labs Invalid input(s): PROCALCITONIN,  WBC,  LACTICIDVEN Microbiology Recent Results (from the past 240 hour(s))  Resp Panel by RT-PCR (Flu A&B, Covid) Nasopharyngeal Swab     Status: None   Collection Time: 07/13/21 12:39 PM   Specimen: Nasopharyngeal Swab; Nasopharyngeal(NP) swabs in vial transport medium  Result Value Ref Range Status   SARS Coronavirus 2 by RT PCR NEGATIVE NEGATIVE Final    Comment: (NOTE) SARS-CoV-2 target nucleic acids  are NOT DETECTED.  The SARS-CoV-2 RNA is generally detectable in upper respiratory specimens during the acute phase of infection. The lowest concentration of SARS-CoV-2 viral copies this assay can detect is 138 copies/mL. A negative result does not preclude SARS-Cov-2 infection and should not be used as the sole basis for treatment or other patient management decisions. A negative result may occur with  improper specimen collection/handling, submission of specimen other than nasopharyngeal swab, presence of viral mutation(s) within the areas targeted by this assay, and inadequate number of viral copies(<138 copies/mL). A negative result must be combined with clinical observations, patient history, and epidemiological information. The expected result is Negative.  Fact Sheet for Patients:  BloggerCourse.com  Fact Sheet for Healthcare Providers:  SeriousBroker.it  This test is no t yet approved or cleared by the [place] FDA and  has been authorized for detection and/or diagnosis of SARS-CoV-2 by FDA under an Emergency Use Authorization (EUA). This EUA will remain  in effect (meaning this test can be used) for the duration of the COVID-19 declaration under Section 564(b)(1) of the  Act, 21 U.S.C.section 360bbb-3(b)(1), unless the authorization is terminated  or revoked sooner.       Influenza A by PCR NEGATIVE NEGATIVE Final   Influenza B by PCR NEGATIVE NEGATIVE Final    Comment: (NOTE) The Xpert Xpress SARS-CoV-2/FLU/RSV plus assay is intended as an aid in the diagnosis of influenza from Nasopharyngeal swab specimens and should not be used as a sole basis for treatment. Nasal washings and aspirates are unacceptable for Xpert Xpress SARS-CoV-2/FLU/RSV testing.  Fact Sheet for Patients: BloggerCourse.com  Fact Sheet for Healthcare Providers: SeriousBroker.it  This test is  not yet approved or cleared by the [place] FDA and has been authorized for detection and/or diagnosis of SARS-CoV-2 by FDA under an Emergency Use Authorization (EUA). This EUA will remain in effect (meaning this test can be used) for the duration of the COVID-19 declaration under Section 564(b)(1) of the Act, 21 U.S.C. section 360bbb-3(b)(1), unless the authorization is terminated or revoked.  Performed at [facility], [address] [place], [place] [address]   Culture, blood (routine x 2)     Status: None (Preliminary result)   Collection Time: 07/13/21 11:23 PM   Specimen: Left Antecubital; Blood  Result Value Ref Range Status   Specimen Description LEFT ANTECUBITAL  Final   Special Requests   Final    BOTTLES DRAWN AEROBIC AND ANAEROBIC Blood Culture adequate volume   Culture   Final    NO GROWTH < 12 HOURS Performed at [facility], [address], [place], [place] [address]    Report Status PENDING  Incomplete  Culture, blood (routine x 2)     Status: None (Preliminary result)   Collection Time: 07/14/21  1:06 AM   Specimen: BLOOD RIGHT HAND  Result Value Ref Range Status   Specimen Description BLOOD RIGHT HAND  Final   Special Requests   Final    BOTTLES DRAWN AEROBIC AND ANAEROBIC Blood Culture adequate volume   Culture   Final    NO GROWTH < 12 HOURS Performed at [facility], [address] [place], [place] [address]    Report Status PENDING  Incomplete   Time coordinating discharge:   SIGNED:  Standley Dakins, MD  [facility] 07/14/2021, 4:44 PM How to contact the [facility] Attending or Consulting provider 7A - 7P or covering provider during after hours 7P -7A, for this patient?  Check the care team in [facility] and look for a) attending/consulting [facility] provider listed and b) the [facility] team listed Log into [email or web address] and use [facility] universal password to access. If you do not have the password, please contact the hospital operator. Locate the [facility]  provider you are looking for under [facility] and page to a number that you can be directly reached. If you still have difficulty reaching the provider, please page the [facility] (Director on Call) for the Hospitalists listed on amion for assistance.

## 2021-07-19 LAB — CULTURE, BLOOD (ROUTINE X 2)
Culture: NO GROWTH
Culture: NO GROWTH
Special Requests: ADEQUATE
Special Requests: ADEQUATE

## 2021-07-21 ENCOUNTER — Telehealth (INDEPENDENT_AMBULATORY_CARE_PROVIDER_SITE_OTHER): Payer: Self-pay

## 2021-07-21 NOTE — Telephone Encounter (Signed)
Patient family called and stating the patient had a egd done on 06/28/2021 by Dr.Castaneda and the patient is unable to keep food down. Patient has appt [facility] with [facility] in May, but they feel she is in need of a feeding tube. Please advise.  ?

## 2021-07-21 NOTE — Telephone Encounter (Signed)
Feeding tube may not be an option as she had a large ulcer in stomach, it is unclear why she is not able to swallow as she had esophagitis in the most recent EGD.  If not able to swallow at all may need to be seen in the ER. ?

## 2021-07-22 NOTE — Telephone Encounter (Signed)
I called and left a message asked that they return call.  ?

## 2021-07-24 ENCOUNTER — Inpatient Hospital Stay (HOSPITAL_COMMUNITY)
Admission: EM | Admit: 2021-07-24 | Discharge: 2021-07-25 | DRG: 640 | Discharge status: Against Medical Advice | Payer: Medicaid Other | Attending: Family Medicine | Admitting: Family Medicine

## 2021-07-24 ENCOUNTER — Encounter (HOSPITAL_COMMUNITY): Payer: Self-pay

## 2021-07-24 ENCOUNTER — Other Ambulatory Visit: Payer: Self-pay

## 2021-07-24 ENCOUNTER — Emergency Department (HOSPITAL_COMMUNITY): Payer: Medicaid Other

## 2021-07-24 DIAGNOSIS — Z8616 Personal history of COVID-19: Secondary | ICD-10-CM | POA: Diagnosis not present

## 2021-07-24 DIAGNOSIS — R112 Nausea with vomiting, unspecified: Secondary | ICD-10-CM | POA: Diagnosis present

## 2021-07-24 DIAGNOSIS — E873 Alkalosis: Secondary | ICD-10-CM | POA: Diagnosis present

## 2021-07-24 DIAGNOSIS — K259 Gastric ulcer, unspecified as acute or chronic, without hemorrhage or perforation: Secondary | ICD-10-CM

## 2021-07-24 DIAGNOSIS — E876 Hypokalemia: Principal | ICD-10-CM

## 2021-07-24 DIAGNOSIS — R1013 Epigastric pain: Secondary | ICD-10-CM

## 2021-07-24 DIAGNOSIS — K253 Acute gastric ulcer without hemorrhage or perforation: Secondary | ICD-10-CM | POA: Diagnosis not present

## 2021-07-24 DIAGNOSIS — E871 Hypo-osmolality and hyponatremia: Secondary | ICD-10-CM | POA: Diagnosis present

## 2021-07-24 DIAGNOSIS — E43 Unspecified severe protein-calorie malnutrition: Secondary | ICD-10-CM | POA: Diagnosis present

## 2021-07-24 DIAGNOSIS — Z8711 Personal history of peptic ulcer disease: Secondary | ICD-10-CM | POA: Diagnosis not present

## 2021-07-24 DIAGNOSIS — I959 Hypotension, unspecified: Secondary | ICD-10-CM | POA: Diagnosis present

## 2021-07-24 DIAGNOSIS — F17210 Nicotine dependence, cigarettes, uncomplicated: Secondary | ICD-10-CM | POA: Diagnosis present

## 2021-07-24 DIAGNOSIS — E860 Dehydration: Secondary | ICD-10-CM | POA: Diagnosis present

## 2021-07-24 DIAGNOSIS — Z888 Allergy status to other drugs, medicaments and biological substances status: Secondary | ICD-10-CM | POA: Diagnosis not present

## 2021-07-24 DIAGNOSIS — K2100 Gastro-esophageal reflux disease with esophagitis, without bleeding: Secondary | ICD-10-CM | POA: Diagnosis present

## 2021-07-24 DIAGNOSIS — K219 Gastro-esophageal reflux disease without esophagitis: Secondary | ICD-10-CM | POA: Diagnosis present

## 2021-07-24 DIAGNOSIS — Z681 Body mass index (BMI) 19 or less, adult: Secondary | ICD-10-CM | POA: Diagnosis not present

## 2021-07-24 DIAGNOSIS — E878 Other disorders of electrolyte and fluid balance, not elsewhere classified: Secondary | ICD-10-CM | POA: Diagnosis present

## 2021-07-24 DIAGNOSIS — Z882 Allergy status to sulfonamides status: Secondary | ICD-10-CM | POA: Diagnosis not present

## 2021-07-24 DIAGNOSIS — Z8673 Personal history of transient ischemic attack (TIA), and cerebral infarction without residual deficits: Secondary | ICD-10-CM | POA: Diagnosis not present

## 2021-07-24 DIAGNOSIS — F1910 Other psychoactive substance abuse, uncomplicated: Secondary | ICD-10-CM

## 2021-07-24 DIAGNOSIS — Z79899 Other long term (current) drug therapy: Secondary | ICD-10-CM

## 2021-07-24 DIAGNOSIS — R109 Unspecified abdominal pain: Secondary | ICD-10-CM | POA: Diagnosis present

## 2021-07-24 LAB — DIFFERENTIAL
Abs Immature Granulocytes: 0.05 10*3/uL (ref 0.00–0.07)
Basophils Absolute: 0 10*3/uL (ref 0.0–0.1)
Basophils Relative: 0 %
Eosinophils Absolute: 0 10*3/uL (ref 0.0–0.5)
Eosinophils Relative: 0 %
Immature Granulocytes: 1 %
Lymphocytes Relative: 22 %
Lymphs Abs: 1.8 10*3/uL (ref 0.7–4.0)
Monocytes Absolute: 0.6 10*3/uL (ref 0.1–1.0)
Monocytes Relative: 7 %
Neutro Abs: 5.6 10*3/uL (ref 1.7–7.7)
Neutrophils Relative %: 70 %

## 2021-07-24 LAB — URINALYSIS, ROUTINE W REFLEX MICROSCOPIC
Bilirubin Urine: NEGATIVE
Glucose, UA: NEGATIVE mg/dL
Hgb urine dipstick: NEGATIVE
Ketones, ur: NEGATIVE mg/dL
Nitrite: NEGATIVE
Protein, ur: 100 mg/dL — AB
Specific Gravity, Urine: 1.018 (ref 1.005–1.030)
pH: 8 (ref 5.0–8.0)

## 2021-07-24 LAB — COMPREHENSIVE METABOLIC PANEL
ALT: 14 U/L (ref 0–44)
AST: 27 U/L (ref 15–41)
Albumin: 3.7 g/dL (ref 3.5–5.0)
Alkaline Phosphatase: 102 U/L (ref 38–126)
BUN: 31 mg/dL — ABNORMAL HIGH (ref 6–20)
CO2: >45 mmol/L — ABNORMAL HIGH (ref 22–32)
Calcium: 9.2 mg/dL (ref 8.9–10.3)
Chloride: <65 mmol/L — CL (ref 98–111)
Creatinine, Ser: 1.07 mg/dL — ABNORMAL HIGH (ref 0.44–1.00)
GFR, Estimated: >60 mL/min (ref >60)
Glucose, Bld: 120 mg/dL — ABNORMAL HIGH (ref 70–99)
Potassium: 2.2 mmol/L — CL (ref 3.5–5.1)
Sodium: 126 mmol/L — ABNORMAL LOW (ref 135–145)
Total Bilirubin: 0.6 mg/dL (ref 0.3–1.2)
Total Protein: 7.7 g/dL (ref 6.5–8.1)

## 2021-07-24 LAB — LIPASE, BLOOD: Lipase: 44 U/L (ref 11–51)

## 2021-07-24 LAB — CBC
HCT: 40.1 % (ref 36.0–46.0)
Hemoglobin: 13.3 g/dL (ref 12.0–15.0)
MCH: 29.1 pg (ref 26.0–34.0)
MCHC: 33.2 g/dL (ref 30.0–36.0)
MCV: 87.7 fL (ref 80.0–100.0)
Platelets: 263 10*3/uL (ref 150–400)
RBC: 4.57 MIL/uL (ref 3.87–5.11)
RDW: 12.5 % (ref 11.5–15.5)
WBC: 7.6 10*3/uL (ref 4.0–10.5)
nRBC: 0 % (ref 0.0–0.2)

## 2021-07-24 LAB — MAGNESIUM: Magnesium: 2.4 mg/dL (ref 1.7–2.4)

## 2021-07-24 LAB — LACTIC ACID, PLASMA: Lactic Acid, Venous: 1.5 mmol/L (ref 0.5–1.9)

## 2021-07-24 LAB — CBG MONITORING, ED: Glucose-Capillary: 118 mg/dL — ABNORMAL HIGH (ref 70–99)

## 2021-07-24 MED ORDER — ALUM & MAG HYDROXIDE-SIMETH 200-200-20 MG/5ML PO SUSP
30.0000 mL | Freq: Once | ORAL | Status: AC
  Administered 2021-07-24: 30 mL via ORAL
  Filled 2021-07-24: qty 30

## 2021-07-24 MED ORDER — POTASSIUM CHLORIDE 10 MEQ/100ML IV SOLN
10.0000 meq | INTRAVENOUS | Status: AC
  Administered 2021-07-24 – 2021-07-25 (×3): 10 meq via INTRAVENOUS
  Filled 2021-07-24 (×3): qty 100

## 2021-07-24 MED ORDER — ONDANSETRON HCL 4 MG/2ML IJ SOLN
4.0000 mg | Freq: Once | INTRAMUSCULAR | Status: AC | PRN
  Administered 2021-07-24: 4 mg via INTRAVENOUS
  Filled 2021-07-24: qty 2

## 2021-07-24 MED ORDER — MAGNESIUM SULFATE 2 GM/50ML IV SOLN
2.0000 g | Freq: Once | INTRAVENOUS | Status: AC
  Administered 2021-07-24: 2 g via INTRAVENOUS
  Filled 2021-07-24: qty 50

## 2021-07-24 MED ORDER — BOOST / RESOURCE BREEZE PO LIQD CUSTOM
1.0000 | Freq: Three times a day (TID) | ORAL | Status: DC
  Administered 2021-07-25: 1 via ORAL

## 2021-07-24 MED ORDER — PANTOPRAZOLE SODIUM 40 MG IV SOLR
40.0000 mg | Freq: Two times a day (BID) | INTRAVENOUS | Status: DC
  Administered 2021-07-25: 40 mg via INTRAVENOUS
  Filled 2021-07-24: qty 10

## 2021-07-24 MED ORDER — FENTANYL CITRATE PF 50 MCG/ML IJ SOSY
50.0000 ug | PREFILLED_SYRINGE | Freq: Once | INTRAMUSCULAR | Status: AC
  Administered 2021-07-24: 50 ug via INTRAVENOUS
  Filled 2021-07-24: qty 1

## 2021-07-24 MED ORDER — PANTOPRAZOLE SODIUM 40 MG IV SOLR
40.0000 mg | Freq: Once | INTRAVENOUS | Status: AC
Start: 2021-07-24 — End: 2021-07-24
  Administered 2021-07-24: 40 mg via INTRAVENOUS
  Filled 2021-07-24: qty 10

## 2021-07-24 MED ORDER — PROCHLORPERAZINE EDISYLATE 10 MG/2ML IJ SOLN
10.0000 mg | Freq: Four times a day (QID) | INTRAMUSCULAR | Status: DC | PRN
  Administered 2021-07-25: 10 mg via INTRAVENOUS
  Filled 2021-07-24: qty 2

## 2021-07-24 MED ORDER — HYDROMORPHONE HCL 1 MG/ML IJ SOLN
1.0000 mg | Freq: Once | INTRAMUSCULAR | Status: AC
  Administered 2021-07-24: 1 mg via INTRAVENOUS
  Filled 2021-07-24: qty 1

## 2021-07-24 MED ORDER — FENTANYL CITRATE PF 50 MCG/ML IJ SOSY
25.0000 ug | PREFILLED_SYRINGE | INTRAMUSCULAR | Status: DC | PRN
  Administered 2021-07-25 (×2): 25 ug via INTRAVENOUS
  Filled 2021-07-24 (×2): qty 1

## 2021-07-24 MED ORDER — ENOXAPARIN SODIUM 40 MG/0.4ML IJ SOSY
40.0000 mg | PREFILLED_SYRINGE | INTRAMUSCULAR | Status: DC
  Administered 2021-07-25: 40 mg via SUBCUTANEOUS
  Filled 2021-07-24: qty 0.4

## 2021-07-24 MED ORDER — LACTATED RINGERS IV BOLUS
1000.0000 mL | Freq: Once | INTRAVENOUS | Status: AC
  Administered 2021-07-24: 1000 mL via INTRAVENOUS

## 2021-07-24 MED ORDER — POTASSIUM CHLORIDE CRYS ER 20 MEQ PO TBCR
60.0000 meq | EXTENDED_RELEASE_TABLET | Freq: Once | ORAL | Status: AC
  Administered 2021-07-24: 60 meq via ORAL
  Filled 2021-07-24: qty 3

## 2021-07-24 MED ORDER — ENSURE ENLIVE PO LIQD
237.0000 mL | Freq: Two times a day (BID) | ORAL | Status: DC
  Administered 2021-07-25: 237 mL via ORAL

## 2021-07-24 NOTE — ED Triage Notes (Signed)
Pt c/o abd pain with some vomiting certain foods x 4 days.  ?

## 2021-07-24 NOTE — ED Provider Notes (Signed)
? EMERGENCY DEPARTMENT ?Provider Note ? ? ?CSN: [id number] ?Arrival date & time: 07/24/21  1844 ? ?  ? ?History ? ?Chief Complaint  ?Patient presents with  ? Abdominal Pain  ? ? ?Kristina Cardenas is a 46 y.o. female. ? ? ?Abdominal Pain ? ?Patient is a 46 year old female with medical history of GERD, stomach ulcers, Bell's palsy, history of stroke substance use disorder presenting today due to epigastric pain.  She says she has been having this for 4 days, feels like a burning pain which is constant.  Associated with nausea and vomiting, vomited 4 times earlier today.  States she has tried smoking "marijuana" and she thinks it was laced with something.  States she has had 2 syncopal episodes in the last week and was admitted 07/13/2021.  She denies any chest pain, shortness of breath.  Denies any dark and tarry stools or coffee-ground emesis. ? ?Home Medications ?Prior to Admission medications   ?Medication Sig Start Date End Date Taking? Authorizing Provider  ?acetaminophen (TYLENOL) 500 MG tablet Take 2 tablets (1,000 mg total) by mouth every 6 (six) hours as needed. 06/29/21   Erick Blinks, MD  ?hydrOXYzine (ATARAX) 25 MG tablet Take 1 tablet (25 mg total) by mouth 3 (three) times daily as needed for anxiety. 06/29/21   Erick Blinks, MD  ?lidocaine (XYLOCAINE) 2 % solution Use as directed 15 mLs in the mouth or throat every 4 (four) hours as needed for mouth pain. ?Patient not taking: Reported on 07/13/2021 06/29/21   Erick Blinks, MD  ?magnesium oxide (MAG-OX) 400 (240 Mg) MG tablet Take 1 tablet (400 mg total) by mouth 2 (two) times daily. ?Patient not taking: Reported on 07/13/2021 06/29/21   Erick Blinks, MD  ?ondansetron (ZOFRAN-ODT) 4 MG disintegrating tablet Take 1 tablet (4 mg total) by mouth every 8 (eight) hours as needed for nausea or vomiting. 06/29/21   Erick Blinks, MD  ?pantoprazole (PROTONIX) 40 MG tablet Take 1 tablet (40 mg total) by mouth 2 (two) times daily. 06/29/21   Erick Blinks, MD  ?sucralfate (CARAFATE) 1 g tablet Take 1 tablet (1 g total) by mouth 4 (four) times daily. 06/29/21 06/29/22  Erick Blinks, MD  ?   ? ?Allergies    ?Sulfa antibiotics and Toradol [ketorolac tromethamine]   ? ?Review of Systems   ?Review of Systems  ?Gastrointestinal:  Positive for abdominal pain.  ? ?Physical Exam ?Updated Vital Signs ?BP 98/74   Pulse 71   Temp (!) 97.2 ?F (36.2 ?C) (Oral)   Resp (!) 23   Ht 5\' 8"  (1.727 m)   Wt 53.8 kg   LMP  (LMP Unknown)   SpO2 (!) 79%   BMI 18.03 kg/m?  ?Physical Exam ?Vitals and nursing note reviewed. Exam conducted with a chaperone present.  ?Constitutional:   ?   General: She is not in acute distress. ?   Appearance: Normal appearance. She is ill-appearing.  ?HENT:  ?   Head: Normocephalic and atraumatic.  ?Eyes:  ?   General: No scleral icterus. ?   Extraocular Movements: Extraocular movements intact.  ?   Pupils: Pupils are equal, round, and reactive to light.  ?Cardiovascular:  ?   Rate and Rhythm: Normal rate and regular rhythm.  ?Pulmonary:  ?   Effort: Pulmonary effort is normal.  ?Abdominal:  ?   General: Abdomen is flat. A surgical scar is present.  ?   Palpations: Abdomen is soft.  ?   Tenderness: There is abdominal tenderness in  the epigastric area.  ?Skin: ?   Coloration: Skin is not jaundiced.  ?Neurological:  ?   Mental Status: She is alert. Mental status is at baseline.  ?   Coordination: Coordination normal.  ? ? ?ED Results / Procedures / Treatments   ?Labs ?(all labs ordered are listed, but only abnormal results are displayed) ?Labs Reviewed  ?COMPREHENSIVE METABOLIC PANEL - Abnormal; Notable for the following components:  ?    Result Value  ? Sodium 126 (*)   ? Potassium 2.2 (*)   ? Chloride <65 (*)   ? CO2 >45 (*)   ? Glucose, Bld 120 (*)   ? BUN 31 (*)   ? Creatinine, Ser 1.07 (*)   ? All other components within normal limits  ?CBG MONITORING, ED - Abnormal; Notable for the following components:  ? Glucose-Capillary 118 (*)   ? All  other components within normal limits  ?LIPASE, BLOOD  ?CBC  ?DIFFERENTIAL  ?LACTIC ACID, PLASMA  ?MAGNESIUM  ?URINALYSIS, ROUTINE W REFLEX MICROSCOPIC  ?POC URINE PREG, ED  ? ? ?EKG ?EKG Interpretation ? ?Date/Time:  Sunday July 24 2021 19:19:25 EDT ?Ventricular Rate:  83 ?PR Interval:  154 ?QRS Duration: 105 ?QT Interval:  396 ?QTC Calculation: 466 ?R Axis:   81 ?Text Interpretation: Sinus rhythm Atrial premature complexes Abnormal T, consider ischemia, diffuse leads Baseline wander in lead(s) V2 ST/T changes similar to Jul 13 2021 Confirmed by Pricilla LovelessGoldston, Scott [phone]([id number]) on 07/24/2021 7:27:25 PM ? ?Radiology ?DG Chest 2 View ? ?Result Date: 07/24/2021 ?CLINICAL DATA:  Hypoxia. EXAM: CHEST - 2 VIEW COMPARISON:  Chest radiograph dated 07/13/2021. FINDINGS: No focal consolidation, pleural effusion or pneumothorax. The cardiac silhouette is within normal limits. No acute osseous pathology. Degenerative changes of the spine. Old healed right rib fractures. IMPRESSION: No active cardiopulmonary disease. Electronically Signed   By: Elgie CollardArash  Radparvar M.D.   On: 07/24/2021 21:19   ? ?Procedures ?Marland Kitchen.Critical Care ?Performed by: Theron AristaSage, Keyly Baldonado, PA-C ?Authorized by: Theron AristaSage, Armel Rabbani, PA-C  ? ?Critical care provider statement:  ?  Critical care time (minutes):  30 ?  Critical care start time:  07/24/2021 9:30 PM ?  Critical care end time:  07/24/2021 10:00 PM ?  Critical care time was exclusive of:  Separately billable procedures and treating other patients ?  Critical care was necessary to treat or prevent imminent or life-threatening deterioration of the following conditions:  Metabolic crisis ?  Critical care was time spent personally by me on the following activities:  Development of treatment plan with patient or surrogate, discussions with consultants, evaluation of patient's response to treatment, examination of patient, ordering and review of laboratory studies, ordering and review of radiographic studies, ordering and performing  treatments and interventions, pulse oximetry, re-evaluation of patient's condition and review of old charts ?  Care discussed with: admitting provider    ? ? ?Medications Ordered in ED ?Medications  ?potassium chloride 10 mEq in 100 mL IVPB (10 mEq Intravenous New Bag/Given 07/24/21 2229)  ?prochlorperazine (COMPAZINE) injection 10 mg (has no administration in time range)  ?pantoprazole (PROTONIX) injection 40 mg (has no administration in time range)  ?ondansetron [facility](ZOFRAN) injection 4 mg (4 mg Intravenous Given 07/24/21 2011)  ?lactated ringers bolus 1,000 mL (1,000 mLs Intravenous New Bag/Given 07/24/21 2018)  ?fentaNYL (SUBLIMAZE) injection 50 mcg (50 mcg Intravenous Given 07/24/21 2049)  ?potassium chloride SA (KLOR-CON M) CR tablet 60 mEq (60 mEq Oral Given 07/24/21 2130)  ?magnesium sulfate IVPB 2 g 50 mL (0 g Intravenous  Stopped 07/24/21 2228)  ?HYDROmorphone (DILAUDID) injection 1 mg (1 mg Intravenous Given 07/24/21 2129)  ?pantoprazole (PROTONIX) injection 40 mg (40 mg Intravenous Given 07/24/21 2128)  ?alum & mag hydroxide-simeth (MAALOX/MYLANTA) 200-200-20 MG/5ML suspension 30 mL (30 mLs Oral Given 07/24/21 2220)  ? ? ?ED Course/ Medical Decision Making/ A&P ?  ?                        ?Medical Decision Making ?Amount and/or Complexity of Data Reviewed ?Labs: ordered. ?Radiology: ordered. ? ?Risk ?Prescription drug management. ?Decision regarding hospitalization. ? ? ?This patient presents to the ED for concern of vomiting and abdominal pain, this involves an extensive number of treatment options, and is a complaint that carries with it a high risk of complications and morbidity.  The differential diagnosis includes dehydration, electrolyte derangement, gastric ulcer ? ? ?Additional history obtained:  ? ?Reviewed patient's notes from prior admissions.  She does have a history of gastric ulcers, recently had EGD during prior hospitalization. ? ?  ?Lab Tests: ? ?I ordered, viewed, and personally interpreted labs.   The pertinent results include:   ?Patient is hypokalemic at 2.2.  Chloride also low at less than 65.  Leukocytosis or anemia.  Lipase within normal notes, magnesium normal. ?  ?Imaging Studies ordered: ? ?I directly visualiz

## 2021-07-24 NOTE — H&P (Addendum)
?History and Physical  ? ? ?Patient: Kristina Cardenas [id number] DOB: [birth year] ?DOA: 07/24/2021 ?DOS: the patient was seen and examined on 07/25/2021 ?PCP: Pcp, No  ?Patient coming from:  ? ?Chief Complaint:  ?Chief Complaint  ?Patient presents with  ? Abdominal Pain  ? ?HPI: Kristina Cardenas is a 46 y.o. female with medical history significant of stomach ulcer, GERD, Bell's palsy, history of stroke and substance abuse who presents to the emergency department due to 4-day onset of abdominal pain which was described as burning in nature, pain was rated as 11/10 on pain scale and this was associated with nausea and nonbloody vomiting with about 4 episodes today, pain was constant.  She states that she has been smoking "marijuana "which she thought was laced with something.  She denies fever, chills, shortness of breath, chest pain, blood in vomitus bloodstained stool. ?Patient was recently admitted from 3/15 to 3/16 due to intractable nausea and vomiting during which she left AMA. ? ?ED course:  ?In the emergency department, temperature was 97.30F, BP was 84/60, but other vital signs were within normal range.  Work-up in the ED showed normal CBC and electrolyte abnormalities including hyponatremia, hypochloremia, hypokalemia. ?Patient was treated with IV fentanyl, IV Dilaudid, IV hydration was given, magnesium and Zofran were given, Protonix was also given.  Hospitalist was asked to admit patient for further evaluation and management. ?  ?Review of Systems: As mentioned in the history of present illness. All other systems reviewed and are negative. ? ?Past Medical History:  ?Diagnosis Date  ? Bell's palsy   ? COVID   ? GERD (gastroesophageal reflux disease)   ? History of stomach ulcers   ? Stroke [facility])   ? ?Past Surgical History:  ?Procedure Laterality Date  ? ABDOMINAL SURGERY    ? BIOPSY  06/28/2021  ? Procedure: BIOPSY;  Surgeon: Montez Morita, Quillian Quince, MD;  Location: [facility] ENDO SU[facility];  Service:  Gastroenterology;;  ? ESOPHAGOGASTRODUODENOSCOPY (EGD) WITH PROPOFOL N/A 06/28/2021  ? Procedure: ESOPHAGOGASTRODUODENOSCOPY (EGD) WITH PROPOFOL;  Surgeon: Harvel Quale, MD;  Location: [facility] ENDO SUITE;  Service: Gastroenterology;  Laterality: N/A;  ? ?Social History:  reports that she has been smoking cigarettes. She has been smoking an average of 1 pack per day. She has never used smokeless tobacco. She reports that she does not currently use alcohol. She reports that she does not currently use drugs. ? ?Allergies  ?Allergen Reactions  ? Sulfa Antibiotics   ? Toradol [Ketorolac Tromethamine]   ? ? ?No family history on file. ? ?Prior to Admission medications   ?Medication Sig Start Date End Date Taking? Authorizing Provider  ?acetaminophen (TYLENOL) 500 MG tablet Take 2 tablets (1,000 mg total) by mouth every 6 (six) hours as needed. 06/29/21   Kathie Dike, MD  ?hydrOXYzine (ATARAX) 25 MG tablet Take 1 tablet (25 mg total) by mouth 3 (three) times daily as needed for anxiety. 06/29/21   Kathie Dike, MD  ?lidocaine (XYLOCAINE) 2 % solution Use as directed 15 mLs in the mouth or throat every 4 (four) hours as needed for mouth pain. ?Patient not taking: Reported on 07/13/2021 06/29/21   Kathie Dike, MD  ?magnesium oxide (MAG-OX) 400 (240 Mg) MG tablet Take 1 tablet (400 mg total) by mouth 2 (two) times daily. ?Patient not taking: Reported on 07/13/2021 06/29/21   Kathie Dike, MD  ?ondansetron (ZOFRAN-ODT) 4 MG disintegrating tablet Take 1 tablet (4 mg total) by mouth every 8 (eight) hours as needed for nausea or vomiting. 06/29/21  Kathie Dike, MD  ?pantoprazole (PROTONIX) 40 MG tablet Take 1 tablet (40 mg total) by mouth 2 (two) times daily. 06/29/21   Kathie Dike, MD  ?sucralfate (CARAFATE) 1 g tablet Take 1 tablet (1 g total) by mouth 4 (four) times daily. 06/29/21 06/29/22  Kathie Dike, MD  ? ? ?Physical Exam: ?Vitals:  ? 07/24/21 2030 07/24/21 2143 07/24/21 2200 07/24/21 2313  ?BP: (!) 84/64  96/72 98/74 97/69   ?Pulse: 84 84 71 77  ?Resp: 19 14 (!) 23 20  ?Temp:    98.6 ?F (37 ?C)  ?TempSrc:    Oral  ?SpO2: 95% 100% (!) 79% 98%  ?Weight:    53.8 kg  ?Height:    5\' 8"  (1.727 m)  ? ?General: Patient was awake and alert and oriented x3. Not in any acute distress.  ?HEENT: NCAT.  PERRLA. EOMI. Sclerae anicteric. Dry mucosal membranes. ?Neck: Neck supple without lymphadenopathy. No carotid bruits. No masses palpated.  ?Cardiovascular: Regular rate with normal S1-S2 sounds. No murmurs, rubs or gallops auscultated. No JVD.  ?Respiratory: Clear breath sounds.  No accessory muscle use. ?Abdomen: Soft, tender to palpation of epigastric area, nondistended. Active bowel sounds.  ?Skin: No rashes, lesions, or ulcerations.  Dry, warm to touch. ?Musculoskeletal:  2+ dorsalis pedis and radial pulses. Good ROM.  No contractures  ?Psychiatric: Intact judgment and insight.  Mood appropriate to current condition. ?Neurologic: No focal neurological deficits. Strength is 5/5 x 4.  CN II - XII grossly intact. ? ?Data Reviewed: ?EKG personally reviewed showed normal sinus rhythm at a rate of 83 bpm with APCs ? ?Assessment/Plan ?Present on Admission: ? Abdominal pain ? Hyponatremia ? Hypochloremia ? Dehydration ? Intractable nausea and vomiting ? ?Principal Problem: ?  Abdominal pain ?Active Problems: ?  Hyponatremia ?  Intractable nausea and vomiting ?  Hypokalemia ?  Dehydration ?  Hypochloremia ? ? ?Intractable nausea and vomiting ?Hyponatremia/hypochloremia ?Hypokalemia ?Dehydration ? ?Intractable nausea and vomiting ?Continue IV Compazine as needed ? ?Abdominal pain possibly due to stomach ulcer ?Patient was seen by gastroenterologist (Dr. Jenetta Downer) on 3/23 ?Continue IV Protonix 40 mg twice daily ?Continue IV fentanyl 2mcg every 2 hours as needed for moderate/severe. ? ?Hyponatremia ?This is possibly secondary to dehydration ?Continue IV hydration ?Continue to monitor sodium levels with serial BMPs ?  ?Acute  hypokalemia ?K+ is 2.1 ?K+ will be replenished ?Please monitor for AM K+ for further replenishmemnt ?  ?Hypochloremia ?Chloride 56, this is possibly secondary to recurrent vomiting and dehydration ?Continue IV hydration and continue to monitor BMP ? ?GERD ?Continue IV Protonix ?  ?Dehydration ?This is probably secondary to recurrent nausea vomiting ?Continue IV hydration ? ?Drug abuse ?Patient endorsed to use of marijuana ?She was counseled against drug abuse ?  ? ?DVT prophylaxis: Lovenox ? ? Advance Care Planning: CODE STATUS: Full code ? ?Consults: None ? ?Family Communication: None at bedside ? ?Severity of Illness: ?The appropriate patient status for this patient is INPATIENT. Inpatient status is judged to be reasonable and necessary in order to provide the required intensity of service to ensure the patient's safety. The patient's presenting symptoms, physical exam findings, and initial radiographic and laboratory data in the context of their chronic comorbidities is felt to place them at high risk for further clinical deterioration. Furthermore, it is not anticipated that the patient will be medically stable for discharge from the hospital within 2 midnights of admission.  ? ?* I certify that at the point of admission it is my clinical judgment that  the patient will require inpatient hospital care spanning beyond 2 midnights from the point of admission due to high intensity of service, high risk for further deterioration and high frequency of surveillance required.* ? ?Author: Bernadette Hoit, DO ?07/25/2021 12:04 AM ? ?For on call review [email or web address].  ?

## 2021-07-24 NOTE — ED Notes (Signed)
Pt up to bedside commode, complaining of nausea. Dr. Thomes Dinning made aware ?

## 2021-07-25 DIAGNOSIS — K253 Acute gastric ulcer without hemorrhage or perforation: Secondary | ICD-10-CM

## 2021-07-25 DIAGNOSIS — F1910 Other psychoactive substance abuse, uncomplicated: Secondary | ICD-10-CM

## 2021-07-25 DIAGNOSIS — K2100 Gastro-esophageal reflux disease with esophagitis, without bleeding: Secondary | ICD-10-CM

## 2021-07-25 LAB — BASIC METABOLIC PANEL
Anion gap: 11 (ref 5–15)
BUN: 16 mg/dL (ref 6–20)
CO2: 32 mmol/L (ref 22–32)
Calcium: 8.6 mg/dL — ABNORMAL LOW (ref 8.9–10.3)
Chloride: 89 mmol/L — ABNORMAL LOW (ref 98–111)
Creatinine, Ser: 0.73 mg/dL (ref 0.44–1.00)
GFR, Estimated: >60 mL/min (ref >60)
Glucose, Bld: 102 mg/dL — ABNORMAL HIGH (ref 70–99)
Potassium: 3 mmol/L — ABNORMAL LOW (ref 3.5–5.1)
Sodium: 132 mmol/L — ABNORMAL LOW (ref 135–145)

## 2021-07-25 LAB — COMPREHENSIVE METABOLIC PANEL
ALT: 13 U/L (ref 0–44)
AST: 27 U/L (ref 15–41)
Albumin: 3.1 g/dL — ABNORMAL LOW (ref 3.5–5.0)
Alkaline Phosphatase: 85 U/L (ref 38–126)
Anion gap: 12 (ref 5–15)
BUN: 25 mg/dL — ABNORMAL HIGH (ref 6–20)
CO2: 40 mmol/L — ABNORMAL HIGH (ref 22–32)
Calcium: 8.5 mg/dL — ABNORMAL LOW (ref 8.9–10.3)
Chloride: 77 mmol/L — ABNORMAL LOW (ref 98–111)
Creatinine, Ser: 0.86 mg/dL (ref 0.44–1.00)
GFR, Estimated: >60 mL/min (ref >60)
Glucose, Bld: 110 mg/dL — ABNORMAL HIGH (ref 70–99)
Potassium: 2.3 mmol/L — CL (ref 3.5–5.1)
Sodium: 129 mmol/L — ABNORMAL LOW (ref 135–145)
Total Bilirubin: 0.5 mg/dL (ref 0.3–1.2)
Total Protein: 6.6 g/dL (ref 6.5–8.1)

## 2021-07-25 LAB — CBC
HCT: 37.8 % (ref 36.0–46.0)
Hemoglobin: 12.5 g/dL (ref 12.0–15.0)
MCH: 30 pg (ref 26.0–34.0)
MCHC: 33.1 g/dL (ref 30.0–36.0)
MCV: 90.9 fL (ref 80.0–100.0)
Platelets: 242 10*3/uL (ref 150–400)
RBC: 4.16 MIL/uL (ref 3.87–5.11)
RDW: 12.8 % (ref 11.5–15.5)
WBC: 8.1 10*3/uL (ref 4.0–10.5)
nRBC: 0 % (ref 0.0–0.2)

## 2021-07-25 LAB — APTT: aPTT: 24 seconds (ref 24–36)

## 2021-07-25 LAB — PHOSPHORUS: Phosphorus: 2.4 mg/dL — ABNORMAL LOW (ref 2.5–4.6)

## 2021-07-25 LAB — MAGNESIUM: Magnesium: 3 mg/dL — ABNORMAL HIGH (ref 1.7–2.4)

## 2021-07-25 MED ORDER — ONDANSETRON HCL 4 MG/2ML IJ SOLN: 4.0000 mg | Freq: Four times a day (QID) | INTRAMUSCULAR | Status: AC | PRN

## 2021-07-25 MED ORDER — PROCHLORPERAZINE EDISYLATE 10 MG/2ML IJ SOLN
10.0000 mg | Freq: Four times a day (QID) | INTRAMUSCULAR | Status: DC | PRN
  Administered 2021-07-25: 10 mg via INTRAVENOUS
  Filled 2021-07-25: qty 2

## 2021-07-25 MED ORDER — ONDANSETRON HCL 4 MG/2ML IJ SOLN
INTRAMUSCULAR | Status: AC
  Administered 2021-07-25: 4 mg
  Filled 2021-07-25: qty 2

## 2021-07-25 MED ORDER — POTASSIUM CHLORIDE CRYS ER 20 MEQ PO TBCR
40.0000 meq | EXTENDED_RELEASE_TABLET | Freq: Once | ORAL | Status: AC
Start: 2021-07-25 — End: 2021-07-25
  Administered 2021-07-25: 40 meq via ORAL
  Filled 2021-07-25: qty 2

## 2021-07-25 MED ORDER — ONDANSETRON HCL 4 MG/2ML IJ SOLN
INTRAMUSCULAR | Status: AC
  Administered 2021-07-25: 4 mg via INTRAVENOUS
  Filled 2021-07-25: qty 2

## 2021-07-25 MED ORDER — ADULT MULTIVITAMIN W/MINERALS CH
1.0000 | ORAL_TABLET | Freq: Every day | ORAL | Status: DC
  Filled 2021-07-25: qty 1

## 2021-07-25 MED ORDER — SODIUM CHLORIDE 0.9 % IV SOLN: INTRAVENOUS | Status: DC

## 2021-07-25 MED ORDER — POTASSIUM CHLORIDE 10 MEQ/100ML IV SOLN
10.0000 meq | INTRAVENOUS | Status: AC
  Administered 2021-07-25 (×4): 10 meq via INTRAVENOUS
  Filled 2021-07-25 (×4): qty 100

## 2021-07-25 MED ORDER — PROCHLORPERAZINE EDISYLATE 10 MG/2ML IJ SOLN
INTRAMUSCULAR | Status: AC
  Administered 2021-07-25: 10 mg via INTRAVENOUS
  Filled 2021-07-25: qty 2

## 2021-07-25 MED ORDER — FENTANYL CITRATE PF 50 MCG/ML IJ SOSY
50.0000 ug | PREFILLED_SYRINGE | INTRAMUSCULAR | Status: DC | PRN
  Administered 2021-07-25 (×2): 50 ug via INTRAVENOUS
  Filled 2021-07-25 (×2): qty 1

## 2021-07-25 MED ORDER — POTASSIUM CHLORIDE IN NACL 40-0.9 MEQ/L-% IV SOLN: INTRAVENOUS | Status: DC

## 2021-07-25 NOTE — Progress Notes (Signed)
Initial Nutrition Assessment ? ?DOCUMENTATION CODES:  ? ?Severe malnutrition in context of chronic illness, Underweight ? ?INTERVENTION:  ?- Encourage PO intake ?- Ensure Enlive po BID, each supplement provides 350 kcal and 20 grams of protein. ?- Boost Breeze po TID, each supplement provides 250 kcal and 9 grams of protein ?- MVI with minerals daily ? ?NUTRITION DIAGNOSIS:  ? ?Severe Malnutrition related to chronic illness (abdominal pain, intractable nausea and vomiting, stomach ulcer, substance abuse) as evidenced by percent weight loss, severe fat depletion, severe muscle depletion. ? ?GOAL:  ? ?Patient will meet greater than or equal to 90% of their needs ? ?MONITOR:  ? ?PO intake, Supplement acceptance, Diet advancement, Labs, Weight trends ? ?REASON FOR ASSESSMENT:  ? ?Malnutrition Screening Tool ?  ? ?ASSESSMENT:  ? ?Pt admitted  with intractable nausea and vomiting and abdominal pain. Pt with prior admission 3/15-3/16 for similar symptoms and left AMA. PMH significant for stomach ulcer, GERD, Bell's palsy, h/o stroke and substance abuse. ? ?Pt provided limited history as she is drowsy and nauseous. She is currently on a full liquid diet. No meal completions documented. Pt nodded no when asking if she has been able to tolerate food or nutrition supplements. She has Ensure and Boost Breeze ordered. Will continue to recommend both supplements as pt is able to tolerate them.  ? ?Reviewed weight history. Pt has had a 6% weight loss within the last month. ? ?Medications: IV protonix, IV NaCl with KCl ? ?Labs: sodium 129, potassium 2.3, BUN 25, phos 2.4, Mg 3.0, CBG 118 ? ?NUTRITION - FOCUSED PHYSICAL EXAM: ? ?Flowsheet Row Most Recent Value  ?Orbital Region Severe depletion  ?Upper Arm Region Moderate depletion  ?Thoracic and Lumbar Region Moderate depletion  ?Buccal Region Severe depletion  ?Temple Region Severe depletion  ?Clavicle Bone Region Severe depletion  ?Clavicle and Acromion Bone Region Severe  depletion  ?Scapular Bone Region Moderate depletion  ?Dorsal Hand Moderate depletion  ?Patellar Region Severe depletion  ?Anterior Thigh Region Severe depletion  ?Posterior Calf Region Moderate depletion  ?Edema (RD Assessment) None  ?Hair Reviewed  ?Eyes Reviewed  ?Mouth Reviewed  ?Skin Reviewed  ?Nails Reviewed  ? ?  ? ?Diet Order:   ?Diet Order   ? ?       ?  Diet full liquid Room service appropriate? Yes; Fluid consistency: Thin  Diet effective now       ?  ? ?  ?  ? ?  ? ?EDUCATION NEEDS:  ? ?Not appropriate for education at this time ? ?Skin:  Skin Assessment: Reviewed RN Assessment ? ?Last BM:  3/25 ? ?Height:  ? ?Ht Readings from Last 1 Encounters:  ?07/24/21 5\' 8"  (1.727 m)  ? ? ?Weight:  ? ?Wt Readings from Last 1 Encounters:  ?07/24/21 53.8 kg  ? ?BMI:  Body mass index is 18.02 kg/m?. ? ?Estimated Nutritional Needs:  ? ?Kcal:  1700-1900 ? ?Protein:  80-95g ? ?Fluid:  >/=1.6L ? ?07/26/21, RDN, LDN ?Clinical Nutrition ?

## 2021-07-25 NOTE — Progress Notes (Signed)
Patient called Clinical research associate into room and asked me to remove her IV  that she was leaving, patient states her pain is not controlled, offered patient to send physician a message to increase dose, patient declined and states " I am leaving". AMA paperwork provided to patient she signed it and this writer witnessed placed in medical record. Removed IV per patient request , patient called her ride ?

## 2021-07-25 NOTE — Progress Notes (Signed)
Left floor for 309 at 1437 ?

## 2021-07-25 NOTE — Progress Notes (Signed)
PO potassium lying on counter, was charted given around 0630 this am but was not. Dr Jarvis Newcomer stated not to give and he will see what potassium level is later. Pt in nad. Report given to Lauren.  ?

## 2021-07-25 NOTE — Progress Notes (Signed)
?Progress Note ? ?Patient: Kristina Cardenas TFT:[id number] DOB: 06/05/1975  ?DOA: 07/24/2021  DOS: 07/25/2021  ?  ?Brief hospital course: ?No notes on file ? ?Assessment and Plan: ?No notes have been filed under this hospital service. ?Service: Hospitalist ? ?History of esophagitis (grade C), gastric PUD, and prepyloric stenosis: Now with intractable abdominal pain, nausea and vomiting.  ?- Continue IV PPI ?- Continues to have regular emesis, but continues attempting oral intake. Cautioned her against advancing to solids at this time, ordered full liquid.  ?- IV zofran prn N/V, IV compazine prn refractory symptoms.  ?- Plan was to repeat endoscopy in about 6 weeks from time of EGD with Dr. Levon Hedger on 2/28. It has been nearly 5 weeks and pt having significant symptoms. If not improving, may need GI reevaluation as inpatient.  ?- Urged to avoid substances including marijuana as cannabinoid hyperemesis may be playing a role, as well as intoxication and withdrawal.  ? ?Hyponatremia, hypochloremic metabolic alkalosis: Due to refractory emesis.  ?- Supplement NS w/KCl and monitor this PM.  ? ?Hypokalemia:  ?- Supplement in IVF, IV runs. With esophagitis and vomiting, will avoid enteral supplementation. Monitor in PM given severity.  ? ?Polysubstance use: Admits to marijuana, cocaine, and snorting (not injecting) heroin.  ?- Cessation counseling provided.  ?- Monitor for withdrawal.  ?- With no evidence of sedation, and high suspicion for increased tolerance, we will increase analgesic dosing for now. Note pt is high risk to leave AMA. We discussed this on rounds this morning. We will continue IV analgesic while still actively vomiting, but not once taking po. ? ?Subjective: Vomited twice during encounter, has ordered 3 trays and actually taken most of each of them only to regurgitate all of it. Abdominal pain is diffuse, constant, worse with emesis, not appreciably improved with medications as currently ordered.   ? ?Objective: ?Vitals:  ? 07/25/21 0700 07/25/21 0900 07/25/21 1340 07/25/21 1400  ?BP: (!) 89/69 103/75 108/83 113/81  ?Pulse: 80 81 74 74  ?Resp: 18  14 18   ?Temp: 97.8 ?F (36.6 ?C)  98.4 ?F (36.9 ?C) 98.4 ?F (36.9 ?C)  ?TempSrc: Axillary  Oral Oral  ?SpO2: 96%  99% 97%  ?Weight:      ?Height:      ? ?Gen: Chronically ill-appearing 46 y.o. female in no distress ?Pulm: Nonlabored breathing room air. Clear, diminished ?CV: Regular rate and rhythm. No murmur, rub, or gallop. No JVD, no dependent edema. ?GI: Abdomen soft, actually essentially not tender, non-distended, with normoactive bowel sounds.  ?Ext: Warm, no deformities ?Skin: No rashes, lesions or ulcers on visualized skin. ?Neuro: Alert and oriented. No focal neurological deficits. ?Psych: Judgement and insight appear fair. Mood euthymic & affect congruent. Behavior is appropriate.   ? ?Data Personally reviewed: ?CBC: ?Recent Labs  ?Lab 07/24/21 ?1952 07/25/21 ?0427  ?WBC 7.6 8.1  ?NEUTROABS 5.6  --   ?HGB 13.3 12.5  ?HCT 40.1 37.8  ?MCV 87.7 90.9  ?PLT 263 242  ? ?Basic Metabolic Panel: ?Recent Labs  ?Lab 07/24/21 ?1952 07/25/21 ?0427  ?NA 126* 129*  ?K 2.2* 2.3*  ?CL <65* 77*  ?CO2 >45* 40*  ?GLUCOSE 120* 110*  ?BUN 31* 25*  ?CREATININE 1.07* 0.86  ?CALCIUM 9.2 8.5*  ?MG 2.4 3.0*  ?PHOS  --  2.4*  ? ?GFR: ?Estimated Creatinine Clearance: 69.4 mL/min (by C-G formula based on SCr of 0.86 mg/dL). ?Liver Function Tests: ?Recent Labs  ?Lab 07/24/21 ?1952 07/25/21 ?0427  ?AST 27 27  ?ALT 14 13  ?  ALKPHOS 102 85  ?BILITOT 0.6 0.5  ?PROT 7.7 6.6  ?ALBUMIN 3.7 3.1*  ? ?Recent Labs  ?Lab 07/24/21 ?1952  ?LIPASE 44  ? ?No results for input(s): AMMONIA in the last 168 hours. ?Coagulation Profile: ?No results for input(s): INR, PROTIME in the last 168 hours. ?Cardiac Enzymes: ?No results for input(s): CKTOTAL, CKMB, CKMBINDEX, TROPONINI in the last 168 hours. ?BNP (last 3 results) ?No results for input(s): PROBNP in the last 8760 hours. ?HbA1C: ?No results for input(s):  HGBA1C in the last 72 hours. ?CBG: ?Recent Labs  ?Lab 07/24/21 ?1921  ?GLUCAP 118*  ? ?Lipid Profile: ?No results for input(s): CHOL, HDL, LDLCALC, TRIG, CHOLHDL, LDLDIRECT in the last 72 hours. ?Thyroid Function Tests: ?No results for input(s): TSH, T4TOTAL, FREET4, T3FREE, THYROIDAB in the last 72 hours. ?Anemia Panel: ?No results for input(s): VITAMINB12, FOLATE, FERRITIN, TIBC, IRON, RETICCTPCT in the last 72 hours. ?Urine analysis: ?   ?Component Value Date/Time  ? COLORURINE YELLOW 07/24/2021 2257  ? APPEARANCEUR CLOUDY (A) 07/24/2021 2257  ? LABSPEC 1.018 07/24/2021 2257  ? PHURINE 8.0 07/24/2021 2257  ? GLUCOSEU NEGATIVE 07/24/2021 2257  ? HGBUR NEGATIVE 07/24/2021 2257  ? BILIRUBINUR NEGATIVE 07/24/2021 2257  ? KETONESUR NEGATIVE 07/24/2021 2257  ? PROTEINUR 100 (A) 07/24/2021 2257  ? NITRITE NEGATIVE 07/24/2021 2257  ? LEUKOCYTESUR SMALL (A) 07/24/2021 2257  ? ?No results found for this or any previous visit (from the past 240 hour(s)).   ?DG Chest 2 View ? ?Result Date: 07/24/2021 ?CLINICAL DATA:  Hypoxia. EXAM: CHEST - 2 VIEW COMPARISON:  Chest radiograph dated 07/13/2021. FINDINGS: No focal consolidation, pleural effusion or pneumothorax. The cardiac silhouette is within normal limits. No acute osseous pathology. Degenerative changes of the spine. Old healed right rib fractures. IMPRESSION: No active cardiopulmonary disease. Electronically Signed   By: Elgie Collard M.D.   On: 07/24/2021 21:19   ?Family Communication: None at bedside ? ?Disposition: ?Status is: Inpatient ?Remains inpatient appropriate because: Intractable N/V and abdominal pain requiring IV fluids and IV medications ?Planned Discharge Destination: Home ? ? ? ? ? ?Tyrone Nine, MD ?07/25/2021 2:35 PM ?Page by [email or web address]  ?

## 2021-07-25 NOTE — Telephone Encounter (Signed)
I called and left a message that if they still needed our assistance to please call the office.  ?

## 2021-07-25 NOTE — TOC Initial Note (Signed)
Transition of Care (TOC) - Initial/Assessment Note  ? ? ?Patient Details  ?Name: Kristina Cardenas ?MRN: [id number] ?Date of Birth: 1975-10-24 ? ?Transition of Care (TOC) CM/SW Contact:    ?Salome Arnt, LCSW ?Phone Number: ?07/25/2021, 9:23 AM ? ?Clinical Narrative:  Pt admitted with intractable nausea and vomiting. Assessment completed due to high risk readmission screening and substance abuse consult. Pt reports she lives with a friend. She is independent with ADLs. Pt plans to return home when medically stable. TOC provided PCP list to pt last admission. She states she is still working on finding PCP and is about halfway through the list but having difficulty finding PCP accepting new patients. Addressed substance use with pt. She reports marijuana, cocaine, and heroin use over the past few months. Pt states that no one would prescribe pain medication for her, so she used drugs to control pain. She admits that it has become a problem for her and she is worried about withdrawal. Pt accepting of substance abuse treatment list. TOC will provide.                ? ? ?Expected Discharge Plan: Home/Self Care ?Barriers to Discharge: Continued Medical Work up ? ? ?Patient Goals and CMS Choice ?Patient states their goals for this hospitalization and ongoing recovery are:: return home ?  ?Choice offered to / list presented to : Patient ? ?Expected Discharge Plan and Services ?Expected Discharge Plan: Home/Self Care ?In-house Referral: Clinical Social Work ?  ?  ?Living arrangements for the past 2 months: [place] ?                ?  ?  ?  ?  ?  ?  ?  ?  ?  ?  ? ?Prior Living Arrangements/Services ?Living arrangements for the past 2 months: [place] ?Lives with:: Minor Children ?Patient language and need for interpreter reviewed:: Yes ?Do you feel safe going back to the place where you live?: Yes      ?Need for Family Participation in Patient Care: No (Comment) ?  ?  ?Criminal Activity/Legal  Involvement Pertinent to Current Situation/Hospitalization: No - Comment as needed ? ?Activities of Daily Living ?Home Assistive Devices/Equipment: None ?ADL Screening (condition at time of admission) ?Patient's cognitive ability adequate to safely complete daily activities?: Yes ?Is the patient deaf or have difficulty hearing?: No ?Does the patient have difficulty seeing, even when wearing glasses/contacts?: No ?Does the patient have difficulty concentrating, remembering, or making decisions?: Yes ?Patient able to express need for assistance with ADLs?: Yes ?Does the patient have difficulty dressing or bathing?: No ?Independently performs ADLs?: Yes (appropriate for developmental age) ?Does the patient have difficulty walking or climbing stairs?: No ?Weakness of Legs: None ?Weakness of Arms/Hands: None ? ?Permission Sought/Granted ?  ?  ?   ?   ?   ?   ? ?Emotional Assessment ?  ?  ?Affect (typically observed): Appropriate ?Orientation: : Oriented to Self, Oriented to Place, Oriented to  Time, Oriented to Situation ?Alcohol / Substance Use: Illicit Drugs ?Psych Involvement: No (comment) ? ?Admission diagnosis:  Hypokalemia [E87.6] ?Abdominal pain [R10.9] ?Hypotension, unspecified hypotension type [I95.9] ?Patient Active Problem List  ? Diagnosis Date Noted  ? Drug abuse ([place]) 07/25/2021  ? Shock ([place]) 07/14/2021  ? Intractable nausea and vomiting 07/13/2021  ? Hypochloremia 07/13/2021  ? Fall at home, initial encounter 07/13/2021  ? Failure to thrive in adult 07/13/2021  ? Prolonged QT interval 07/13/2021  ? Protein-calorie  malnutrition, severe 06/29/2021  ? Acute esophagitis   ? Gastric outlet obstruction   ? Abdominal pain, epigastric   ? Dehydration 06/25/2021  ? Hyponatremia 06/25/2021  ? Acute hyponatremia 06/25/2021  ? Abdominal pain 12/25/2020  ? Thrombocytosis 12/25/2020  ? Elevated alkaline phosphatase level 12/25/2020  ? Tobacco abuse 12/25/2020  ? Nausea and vomiting   ? Gastric ulcer 04/03/2020  ?  Stomach ulcer 04/03/2020  ? Hypokalemia 04/02/2020  ? Metabolic alkalosis [id number]  ? Bell's palsy   ? Stroke [facility])   ? History of Upper GI bleed 10/01/2015  ? Gastroesophageal reflux disease 01/03/2002  ? ?PCP:  Pcp, No ?Pharmacy:   ?[id number] - [place] ?[place] ?[place] [address] ?Phone: [phone] Fax: [phone] ? ? ? ? ?Social Determinants of Health (SDOH) Interventions ?  ? ?Readmission Risk Interventions ? ?  07/25/2021  ?  8:49 AM  ?Readmission Risk Prevention Plan  ?Transportation Screening Complete  ?Medication Review Press photographer) Complete  ?[place] or Home Care Consult Complete  ?SW Recovery Care/Counseling Consult Complete  ?Palliative Care Screening Not Applicable  ?[place] Not Applicable  ? ? ? ?

## 2021-07-26 NOTE — Discharge Summary (Signed)
?Physician Discharge Summary ?  ?Patient: Kristina SchmidtKimberley Gomer MRN: [id number] DOB: 06/26/1975  ?Admit date:     07/24/2021  ?Discharge date: 07/25/2021 Against Medical Advice  ?Discharge Physician: Tyrone Nineyan B Emmajo Bennette  ? ?PCP: Pcp, No  ? ?Recommendations at discharge:  ?GI follow up ? ?Discharge Diagnoses: ?Principal Problem: ?  Abdominal pain ?Active Problems: ?  Hyponatremia ?  Intractable nausea and vomiting ?  Hypokalemia ?  Gastroesophageal reflux disease ?  Stomach ulcer ?  Dehydration ?  Hypochloremia ?  Drug abuse (HCC) ? ?Hospital Course: ?HPI: Kristina Cardenas is a 46 y.o. female with medical history significant of stomach ulcer, GERD, Bell's palsy, history of stroke and substance abuse who presents to the emergency department due to 4-day onset of abdominal pain which was described as burning in nature, pain was rated as 11/10 on pain scale and this was associated with nausea and nonbloody vomiting with about 4 episodes today, pain was constant.  She states that she has been smoking "marijuana "which she thought was laced with something.  She denies fever, chills, shortness of breath, chest pain, blood in vomitus bloodstained stool. ?Patient was recently admitted from 3/15 to 3/16 due to intractable nausea and vomiting during which she left AMA. ?  ?ED course:  ?In the emergency department, temperature was 97.79F, BP was 84/60, but other vital signs were within normal range.  Work-up in the ED showed normal CBC and electrolyte abnormalities including hyponatremia, hypochloremia, hypokalemia. ?Patient was treated with IV fentanyl, IV Dilaudid, IV hydration was given, magnesium and Zofran were given, Protonix was also given.  Hospitalist was asked to admit patient for further evaluation and management. ? ?Hospital Course: Analgesic and antiemetic regimens were augmented due to uncontrolled symptoms. The patient later in the evening left against medical advice. The progress note from the morning is copied  below. ? ?Assessment and Plan: ?History of esophagitis (grade C), gastric PUD, and prepyloric stenosis: Now with intractable abdominal pain, nausea and vomiting.  ?- Continue IV PPI ?- Continues to have regular emesis, but continues attempting oral intake. Cautioned her against advancing to solids at this time, ordered full liquid.  ?- IV zofran prn N/V, IV compazine prn refractory symptoms.  ?- Plan was to repeat endoscopy in about 6 weeks from time of EGD with Dr. Levon Hedgerastaneda on 2/28. It has been nearly 5 weeks and pt having significant symptoms. If not improving, may need GI reevaluation as inpatient.  ?- Urged to avoid substances including marijuana as cannabinoid hyperemesis may be playing a role, as well as intoxication and withdrawal.  ?  ?Hyponatremia, hypochloremic metabolic alkalosis: Due to refractory emesis.  ?- Supplement NS w/KCl and monitor this PM.  ?  ?Hypokalemia:  ?- Supplement in IVF, IV runs. With esophagitis and vomiting, will avoid enteral supplementation. Monitor in PM given severity.  ?  ?Polysubstance use: Admits to marijuana, cocaine, and snorting (not injecting) heroin.  ?- Cessation counseling provided.  ?- Monitor for withdrawal.  ?- With no evidence of sedation, and high suspicion for increased tolerance, we will increase analgesic dosing for now. Note pt is high risk to leave AMA. We discussed this on rounds this morning. We will continue IV analgesic while still actively vomiting, but not once taking po. ? ?Severe protein calorie malnutrition: Supplement protein ? ?Consultants: None ?Procedures performed: None  ?Disposition: Home ?Diet recommendation:  ?Full liquid diet ?DISCHARGE MEDICATION: ?Allergies as of 07/25/2021   ? ?   Reactions  ? Sulfa Antibiotics   ? Toradol [ketorolac  Tromethamine]   ? ?  ? ?  ?Medication List  ?  ? ?ASK your doctor about these medications   ? ?acetaminophen 500 MG tablet ?Commonly known as: TYLENOL ?Take 2 tablets (1,000 mg total) by mouth every 6 (six)  hours as needed. ?  ?hydrOXYzine 25 MG tablet ?Commonly known as: ATARAX ?Take 1 tablet (25 mg total) by mouth 3 (three) times daily as needed for anxiety. ?  ?lidocaine 2 % solution ?Commonly known as: XYLOCAINE ?Use as directed 15 mLs in the mouth or throat every 4 (four) hours as needed for mouth pain. ?  ?magnesium oxide 400 (240 Mg) MG tablet ?Commonly known as: MAG-OX ?Take 1 tablet (400 mg total) by mouth 2 (two) times daily. ?  ?ondansetron 4 MG disintegrating tablet ?Commonly known as: ZOFRAN-ODT ?Take 1 tablet (4 mg total) by mouth every 8 (eight) hours as needed for nausea or vomiting. ?  ?pantoprazole 40 MG tablet ?Commonly known as: PROTONIX ?Take 1 tablet (40 mg total) by mouth 2 (two) times daily. ?  ?sucralfate 1 g tablet ?Commonly known as: Carafate ?Take 1 tablet (1 g total) by mouth 4 (four) times daily. ?  ? ?  ? ? ?Discharge Exam: ?Filed Weights  ? 07/24/21 1851 07/24/21 2313  ?Weight: 53.8 kg 53.8 kg  ?Gen: Chronically ill-appearing 46 y.o. female in no distress ?Pulm: Nonlabored breathing room air. Clear, diminished ?CV: Regular rate and rhythm. No murmur, rub, or gallop. No JVD, no dependent edema. ?GI: Abdomen soft, actually essentially not tender, non-distended, with normoactive bowel sounds.  ?Ext: Warm, no deformities ?Skin: No rashes, lesions or ulcers on visualized skin. ?Neuro: Alert and oriented. No focal neurological deficits. ?Psych: Judgement and insight appear fair. Mood euthymic & affect congruent. Behavior is appropriate.   ? ?Condition at discharge:  Not felt to be medically optimized ? ?The results of significant diagnostics from this hospitalization (including imaging, microbiology, ancillary and laboratory) are listed below for reference.  ? ?Imaging Studies: ?DG Chest 2 View ? ?Result Date: 07/24/2021 ?CLINICAL DATA:  Hypoxia. EXAM: CHEST - 2 VIEW COMPARISON:  Chest radiograph dated 07/13/2021. FINDINGS: No focal consolidation, pleural effusion or pneumothorax. The cardiac  silhouette is within normal limits. No acute osseous pathology. Degenerative changes of the spine. Old healed right rib fractures. IMPRESSION: No active cardiopulmonary disease. Electronically Signed   By: Elgie Collard M.D.   On: 07/24/2021 21:19  ? ?CT HEAD WO CONTRAST ( ) ? ?Result Date: 07/13/2021 ?CLINICAL DATA:  Head trauma. Status post fall. Hit head on left side. Nausea and vomiting. EXAM: CT HEAD WITHOUT CONTRAST TECHNIQUE: Contiguous axial images were obtained from the base of the skull through the vertex without intravenous contrast. RADIATION DOSE REDUCTION: This exam was performed according to the departmental dose-optimization program which includes automated exposure control, adjustment of the mA and/or kV according to patient size and/or use of iterative reconstruction technique. COMPARISON:  None. FINDINGS: Brain: No evidence of acute infarction, hemorrhage, hydrocephalus, extra-axial collection or mass lesion/mass effect. Vascular: No hyperdense vessel or unexpected calcification. Skull: Normal. Negative for fracture or focal lesion. Sinuses/Orbits: No acute finding. Other: None IMPRESSION: 1. No acute intracranial abnormalities. Electronically Signed   By: Signa Kell M.D.   On: 07/13/2021 05:40  ? ?CT ABDOMEN PELVIS W CONTRAST ? ?Result Date: 06/28/2021 ?CLINICAL DATA:  46 year old female with abdominal pain, nausea vomiting. Pneumobilia with no definite prior sphincterotomy per the EMR. But history of GI bleeding due to duodenal ulcer in 2017 treated with endoscopy at  that time. EXAM: CT ABDOMEN AND PELVIS WITH CONTRAST TECHNIQUE: Multidetector CT imaging of the abdomen and pelvis was performed using the standard protocol following bolus administration of intravenous contrast. RADIATION DOSE REDUCTION: This exam was performed according to the departmental dose-optimization program which includes automated exposure control, adjustment of the mA and/or kV according to patient size and/or use  of iterative reconstruction technique. CONTRAST:  OMNIPAQUE IOHEXOL 300 MG/ML  SOLN COMPARISON:  Noncontrast CT Abdomen and Pelvis 06/25/2021. CT with contrast 12/24/2020. FINDINGS: Lower chest: Lung bases are cl

## 2021-07-28 NOTE — Telephone Encounter (Signed)
No response from the patient.

## 2021-07-29 ENCOUNTER — Encounter (INDEPENDENT_AMBULATORY_CARE_PROVIDER_SITE_OTHER): Payer: Self-pay

## 2021-07-29 ENCOUNTER — Emergency Department (HOSPITAL_COMMUNITY)
Admission: EM | Admit: 2021-07-29 | Discharge: 2021-07-29 | Disposition: A | Discharge status: Home / Self Care | Payer: Medicaid Other | Source: Home / Self Care

## 2021-07-29 ENCOUNTER — Other Ambulatory Visit (INDEPENDENT_AMBULATORY_CARE_PROVIDER_SITE_OTHER): Payer: Self-pay | Admitting: Gastroenterology

## 2021-07-29 DIAGNOSIS — R112 Nausea with vomiting, unspecified: Secondary | ICD-10-CM

## 2021-07-29 DIAGNOSIS — K209 Esophagitis, unspecified without bleeding: Secondary | ICD-10-CM

## 2021-07-29 MED ORDER — LIDOCAINE VISCOUS HCL 2 % MT SOLN: 15.0000 mL | OROMUCOSAL | 2 refills | Status: AC | PRN

## 2021-07-29 MED ORDER — ONDANSETRON 4 MG PO TBDP: 4.0000 mg | ORAL_TABLET | Freq: Three times a day (TID) | ORAL | 1 refills | Status: AC | PRN

## 2021-07-30 ENCOUNTER — Encounter (INDEPENDENT_AMBULATORY_CARE_PROVIDER_SITE_OTHER): Payer: Self-pay

## 2021-08-11 ENCOUNTER — Ambulatory Visit (INDEPENDENT_AMBULATORY_CARE_PROVIDER_SITE_OTHER): Payer: Medicaid Other | Admitting: Gastroenterology

## 2021-08-15 ENCOUNTER — Encounter (HOSPITAL_COMMUNITY): Payer: Self-pay

## 2021-08-15 ENCOUNTER — Emergency Department (HOSPITAL_COMMUNITY): Payer: Medicaid Other

## 2021-08-15 ENCOUNTER — Other Ambulatory Visit: Payer: Self-pay

## 2021-08-15 ENCOUNTER — Inpatient Hospital Stay (HOSPITAL_COMMUNITY)
Admission: EM | Admit: 2021-08-15 | Discharge: 2021-08-16 | DRG: 641 | Discharge status: Against Medical Advice | Payer: Medicaid Other | Attending: Internal Medicine | Admitting: Internal Medicine

## 2021-08-15 DIAGNOSIS — N179 Acute kidney failure, unspecified: Secondary | ICD-10-CM | POA: Diagnosis present

## 2021-08-15 DIAGNOSIS — E873 Alkalosis: Secondary | ICD-10-CM

## 2021-08-15 DIAGNOSIS — I959 Hypotension, unspecified: Secondary | ICD-10-CM | POA: Diagnosis present

## 2021-08-15 DIAGNOSIS — Z882 Allergy status to sulfonamides status: Secondary | ICD-10-CM | POA: Diagnosis not present

## 2021-08-15 DIAGNOSIS — F1910 Other psychoactive substance abuse, uncomplicated: Secondary | ICD-10-CM | POA: Diagnosis not present

## 2021-08-15 DIAGNOSIS — Z8711 Personal history of peptic ulcer disease: Secondary | ICD-10-CM | POA: Diagnosis not present

## 2021-08-15 DIAGNOSIS — R9431 Abnormal electrocardiogram [ECG] [EKG]: Secondary | ICD-10-CM | POA: Diagnosis present

## 2021-08-15 DIAGNOSIS — E874 Mixed disorder of acid-base balance: Secondary | ICD-10-CM | POA: Diagnosis present

## 2021-08-15 DIAGNOSIS — F1193 Opioid use, unspecified with withdrawal: Secondary | ICD-10-CM

## 2021-08-15 DIAGNOSIS — E876 Hypokalemia: Principal | ICD-10-CM | POA: Diagnosis present

## 2021-08-15 DIAGNOSIS — Z8673 Personal history of transient ischemic attack (TIA), and cerebral infarction without residual deficits: Secondary | ICD-10-CM | POA: Diagnosis not present

## 2021-08-15 DIAGNOSIS — G8929 Other chronic pain: Secondary | ICD-10-CM | POA: Diagnosis present

## 2021-08-15 DIAGNOSIS — Z79899 Other long term (current) drug therapy: Secondary | ICD-10-CM | POA: Diagnosis not present

## 2021-08-15 DIAGNOSIS — Z20822 Contact with and (suspected) exposure to covid-19: Secondary | ICD-10-CM | POA: Diagnosis present

## 2021-08-15 DIAGNOSIS — E860 Dehydration: Secondary | ICD-10-CM | POA: Diagnosis present

## 2021-08-15 DIAGNOSIS — K2100 Gastro-esophageal reflux disease with esophagitis, without bleeding: Secondary | ICD-10-CM | POA: Diagnosis not present

## 2021-08-15 DIAGNOSIS — R112 Nausea with vomiting, unspecified: Secondary | ICD-10-CM | POA: Diagnosis present

## 2021-08-15 DIAGNOSIS — F17210 Nicotine dependence, cigarettes, uncomplicated: Secondary | ICD-10-CM | POA: Diagnosis present

## 2021-08-15 DIAGNOSIS — F1123 Opioid dependence with withdrawal: Secondary | ICD-10-CM | POA: Diagnosis present

## 2021-08-15 DIAGNOSIS — Z720 Tobacco use: Secondary | ICD-10-CM | POA: Diagnosis present

## 2021-08-15 DIAGNOSIS — E871 Hypo-osmolality and hyponatremia: Secondary | ICD-10-CM | POA: Diagnosis present

## 2021-08-15 DIAGNOSIS — K219 Gastro-esophageal reflux disease without esophagitis: Secondary | ICD-10-CM | POA: Diagnosis present

## 2021-08-15 DIAGNOSIS — Z888 Allergy status to other drugs, medicaments and biological substances status: Secondary | ICD-10-CM | POA: Diagnosis not present

## 2021-08-15 LAB — CBC
HCT: 36.1 % (ref 36.0–46.0)
Hemoglobin: 12 g/dL (ref 12.0–15.0)
MCH: 28.8 pg (ref 26.0–34.0)
MCHC: 33.2 g/dL (ref 30.0–36.0)
MCV: 86.6 fL (ref 80.0–100.0)
Platelets: 315 10*3/uL (ref 150–400)
RBC: 4.17 MIL/uL (ref 3.87–5.11)
RDW: 12.9 % (ref 11.5–15.5)
WBC: 9.1 10*3/uL (ref 4.0–10.5)
nRBC: 0 % (ref 0.0–0.2)

## 2021-08-15 LAB — BLOOD GAS, VENOUS
Acid-Base Excess: 32.5 mmol/L — ABNORMAL HIGH (ref 0.0–2.0)
Bicarbonate: 59.3 mmol/L — ABNORMAL HIGH (ref 20.0–28.0)
Drawn by: 5678
FIO2: 28 %
O2 Saturation: 86.6 %
Patient temperature: 36.8
pCO2, Ven: 58 mmHg (ref 44–60)
pH, Ven: 7.61 (ref 7.25–7.43)
pO2, Ven: 46 mmHg — ABNORMAL HIGH (ref 32–45)

## 2021-08-15 LAB — COMPREHENSIVE METABOLIC PANEL
ALT: 12 U/L (ref 0–44)
AST: 22 U/L (ref 15–41)
Albumin: 3.2 g/dL — ABNORMAL LOW (ref 3.5–5.0)
Alkaline Phosphatase: 78 U/L (ref 38–126)
BUN: 30 mg/dL — ABNORMAL HIGH (ref 6–20)
CO2: >45 mmol/L — ABNORMAL HIGH (ref 22–32)
Calcium: 8.8 mg/dL — ABNORMAL LOW (ref 8.9–10.3)
Chloride: <65 mmol/L — CL (ref 98–111)
Creatinine, Ser: 1.29 mg/dL — ABNORMAL HIGH (ref 0.44–1.00)
GFR, Estimated: 52 mL/min — ABNORMAL LOW (ref >60)
Glucose, Bld: 122 mg/dL — ABNORMAL HIGH (ref 70–99)
Potassium: <2 mmol/L — CL (ref 3.5–5.1)
Sodium: 127 mmol/L — ABNORMAL LOW (ref 135–145)
Total Bilirubin: 0.8 mg/dL (ref 0.3–1.2)
Total Protein: 7.3 g/dL (ref 6.5–8.1)

## 2021-08-15 LAB — RESP PANEL BY RT-PCR (FLU A&B, COVID) ARPGX2
Influenza A by PCR: NEGATIVE
Influenza B by PCR: NEGATIVE
SARS Coronavirus 2 by RT PCR: NEGATIVE

## 2021-08-15 LAB — RAPID URINE DRUG SCREEN, HOSP PERFORMED
Amphetamines: NOT DETECTED
Barbiturates: NOT DETECTED
Benzodiazepines: NOT DETECTED
Cocaine: POSITIVE — AB
Opiates: NOT DETECTED
Tetrahydrocannabinol: NOT DETECTED

## 2021-08-15 LAB — BASIC METABOLIC PANEL
Anion gap: 10 (ref 5–15)
BUN: 21 mg/dL — ABNORMAL HIGH (ref 6–20)
CO2: 44 mmol/L — ABNORMAL HIGH (ref 22–32)
Calcium: 8.4 mg/dL — ABNORMAL LOW (ref 8.9–10.3)
Chloride: 76 mmol/L — ABNORMAL LOW (ref 98–111)
Creatinine, Ser: 1.02 mg/dL — ABNORMAL HIGH (ref 0.44–1.00)
GFR, Estimated: >60 mL/min (ref >60)
Glucose, Bld: 88 mg/dL (ref 70–99)
Potassium: 2.3 mmol/L — CL (ref 3.5–5.1)
Sodium: 130 mmol/L — ABNORMAL LOW (ref 135–145)

## 2021-08-15 LAB — LACTIC ACID, PLASMA
Lactic Acid, Venous: 2.2 mmol/L (ref 0.5–1.9)
Lactic Acid, Venous: 2.5 mmol/L (ref 0.5–1.9)

## 2021-08-15 LAB — TYPE AND SCREEN
ABO/RH(D): A POS
Antibody Screen: NEGATIVE

## 2021-08-15 LAB — MAGNESIUM: Magnesium: 2.5 mg/dL — ABNORMAL HIGH (ref 1.7–2.4)

## 2021-08-15 MED ORDER — BUPRENORPHINE HCL-NALOXONE HCL 8-2 MG SL SUBL
1.0000 | SUBLINGUAL_TABLET | Freq: Two times a day (BID) | SUBLINGUAL | Status: DC
  Administered 2021-08-15: 1 via SUBLINGUAL
  Filled 2021-08-15: qty 1

## 2021-08-15 MED ORDER — PROCHLORPERAZINE EDISYLATE 10 MG/2ML IJ SOLN: 10.0000 mg | Freq: Four times a day (QID) | INTRAMUSCULAR | Status: DC | PRN

## 2021-08-15 MED ORDER — ENSURE ENLIVE PO LIQD
237.0000 mL | Freq: Three times a day (TID) | ORAL | Status: DC
  Filled 2021-08-15 (×3): qty 237

## 2021-08-15 MED ORDER — BUPRENORPHINE HCL-NALOXONE HCL 8-2 MG SL SUBL: 1.0000 | SUBLINGUAL_TABLET | Freq: Two times a day (BID) | SUBLINGUAL | Status: DC

## 2021-08-15 MED ORDER — BUPRENORPHINE HCL-NALOXONE HCL 2-0.5 MG SL SUBL: 3.0000 | SUBLINGUAL_TABLET | SUBLINGUAL | Status: DC | PRN

## 2021-08-15 MED ORDER — ALUM & MAG HYDROXIDE-SIMETH 200-200-20 MG/5ML PO SUSP
30.0000 mL | Freq: Once | ORAL | Status: AC
  Administered 2021-08-15: 30 mL via ORAL
  Filled 2021-08-15: qty 30

## 2021-08-15 MED ORDER — POTASSIUM CHLORIDE IN NACL 40-0.9 MEQ/L-% IV SOLN
INTRAVENOUS | Status: DC
  Filled 2021-08-15 (×5): qty 1000

## 2021-08-15 MED ORDER — PANTOPRAZOLE SODIUM 40 MG IV SOLR
40.0000 mg | Freq: Once | INTRAVENOUS | Status: AC
Start: 2021-08-15 — End: 2021-08-15
  Administered 2021-08-15: 40 mg via INTRAVENOUS
  Filled 2021-08-15: qty 10

## 2021-08-15 MED ORDER — SUCRALFATE 1 GM/10ML PO SUSP
1.0000 g | Freq: Three times a day (TID) | ORAL | Status: DC
  Administered 2021-08-15 (×3): 1 g via ORAL
  Filled 2021-08-15 (×3): qty 10

## 2021-08-15 MED ORDER — PANTOPRAZOLE SODIUM 40 MG IV SOLR
40.0000 mg | Freq: Two times a day (BID) | INTRAVENOUS | Status: DC
  Administered 2021-08-15: 40 mg via INTRAVENOUS
  Filled 2021-08-15: qty 10

## 2021-08-15 MED ORDER — BUPRENORPHINE HCL-NALOXONE HCL 2-0.5 MG SL SUBL: 2.0000 | SUBLINGUAL_TABLET | SUBLINGUAL | Status: DC | PRN

## 2021-08-15 MED ORDER — SODIUM CHLORIDE 0.9 % IV SOLN
INTRAVENOUS | Status: AC
  Filled 2021-08-15: qty 1000

## 2021-08-15 MED ORDER — NICOTINE 21 MG/24HR TD PT24
21.0000 mg | MEDICATED_PATCH | Freq: Every day | TRANSDERMAL | Status: DC
  Administered 2021-08-15: 21 mg via TRANSDERMAL
  Filled 2021-08-15: qty 1

## 2021-08-15 MED ORDER — ACETAMINOPHEN 325 MG PO TABS
650.0000 mg | ORAL_TABLET | Freq: Four times a day (QID) | ORAL | Status: DC | PRN
  Filled 2021-08-15: qty 2

## 2021-08-15 MED ORDER — POTASSIUM CHLORIDE CRYS ER 20 MEQ PO TBCR
40.0000 meq | EXTENDED_RELEASE_TABLET | ORAL | Status: AC
  Administered 2021-08-15 (×2): 40 meq via ORAL
  Filled 2021-08-15 (×2): qty 2

## 2021-08-15 MED ORDER — LACTATED RINGERS IV BOLUS
1000.0000 mL | Freq: Once | INTRAVENOUS | Status: AC
  Administered 2021-08-15: 1000 mL via INTRAVENOUS

## 2021-08-15 MED ORDER — FENTANYL CITRATE PF 50 MCG/ML IJ SOSY
50.0000 ug | PREFILLED_SYRINGE | INTRAMUSCULAR | Status: DC | PRN
  Administered 2021-08-15: 50 ug via INTRAVENOUS
  Filled 2021-08-15: qty 1

## 2021-08-15 MED ORDER — ACETAMINOPHEN 650 MG RE SUPP
650.0000 mg | Freq: Four times a day (QID) | RECTAL | Status: DC | PRN
Start: 2021-08-15 — End: 2021-08-16

## 2021-08-15 MED ORDER — BUPRENORPHINE HCL-NALOXONE HCL 2-0.5 MG SL SUBL
2.0000 | SUBLINGUAL_TABLET | SUBLINGUAL | Status: DC | PRN
  Administered 2021-08-15 (×2): 2 via SUBLINGUAL
  Filled 2021-08-15: qty 2

## 2021-08-15 MED ORDER — SODIUM CHLORIDE 0.9 % IV SOLN: INTRAVENOUS | Status: DC

## 2021-08-15 NOTE — ED Notes (Signed)
Gave patient a protein shake ok'd per MD.  Suboxone increased to 3 tabs due to patients inability to tolerate pain since BP is too low for fentanyl ?

## 2021-08-15 NOTE — ED Notes (Signed)
Placed only 2L [place] due to sats 86-89% on RA.  Patient continues to ask for narcotic pain medication MD offered toradol patient refused.  Patients BP is too low for narcotics and this has been explained to the patient.  Patient admits to snorting heroin for her pain.  States "im going ot be in bad shape if I dont get something and suboxone makes me sick as a dog". ?

## 2021-08-15 NOTE — ED Notes (Addendum)
Patient given tomato soup and ginger ale at this time. ?

## 2021-08-15 NOTE — H&P (Signed)
?History and Physical  ? ? ?Patient: Kristina Cardenas GNF:[id number] DOB: 09/26/1975 ?DOA: 08/15/2021 ?DOS: the patient was seen and examined on 08/15/2021 ?PCP: Pcp, No  ?Patient coming from: Home ? ?Chief Complaint:  ?Chief Complaint  ?Patient presents with  ? Fall  ? ?HPI: Kristina SchmidtKimberley Zweig is a [age] y.o. female with medical history significant of gastroesophageal reflux disease, gastric outlet obstruction, peptic ulcer, prior history of Bell's palsy, prior history of stroke, polysubstance abuse (including heroin, narcotics and tobacco) and chronic abdominal pain; who presented to the emergency department secondary to abdominal pain and intractable nausea and vomiting.  Patient reports that for the last 3 days she has not been able to keep anything down and has been experiencing 3-5 episodes of vomiting.  No hematemesis, no melena, no dysuria, no hematuria, no chest pain, no shortness of breath, no fever. ?Patient reports no sick contacts. ? ?COVID PCR negative. ? ?On arrival to the emergency department patient was found to be hypotensive (not exactly new for her), significantly dehydrated and with electrolytes abnormalities demonstrating hyponatremia, hypochloremia and hypokalemia.  There was also presence of acute kidney injury with a creatinine of 1.29 and a BUN of 30.  Aggressive fluid resuscitation and electrolyte repletion has been initiated; TR[facility] has been called to place patient in the hospital for further evaluation and management. ? ?Review of Systems: As mentioned in the history of present illness. All other systems reviewed and are negative. ?Past Medical History:  ?Diagnosis Date  ? Bell's palsy   ? COVID   ? GERD (gastroesophageal reflux disease)   ? History of stomach ulcers   ? Stroke [facility](HCC)   ? ?Past Surgical History:  ?Procedure Laterality Date  ? ABDOMINAL SURGERY    ? BIOPSY  06/28/2021  ? Procedure: BIOPSY;  Surgeon: Marguerita Merlesastaneda Mayorga, Reuel Boomaniel, MD;  Location: [facility];  Service: Gastroenterology;;   ? ESOPHAGOGASTRODUODENOSCOPY (EGD) WITH PROPOFOL N/A 06/28/2021  ? Procedure: ESOPHAGOGASTRODUODENOSCOPY (EGD) WITH PROPOFOL;  Surgeon: Dolores Frameastaneda Mayorga, Daniel, MD;  Location: [facility];  Service: Gastroenterology;  Laterality: N/A;  ? ?Social History:  reports that she has been smoking cigarettes. She has been smoking an average of 1 pack per day. She has never used smokeless tobacco. She reports that she does not currently use alcohol. She reports that she does not currently use drugs. ? ?Allergies  ?Allergen Reactions  ? Sulfa Antibiotics   ? Toradol [Ketorolac Tromethamine]   ? ? ?History reviewed. No pertinent family history. ? ?Prior to Admission medications   ?Medication Sig Start Date End Date Taking? Authorizing Provider  ?acetaminophen (TYLENOL) 500 MG tablet Take 2 tablets (1,000 mg total) by mouth every 6 (six) hours as needed. 06/29/21  Yes Erick BlinksMemon, Jehanzeb, MD  ?ondansetron (ZOFRAN-ODT) 4 MG disintegrating tablet Take 1 tablet (4 mg total) by mouth every 8 (eight) hours as needed for nausea or vomiting. 07/29/21  Yes Marguerita Merlesastaneda Mayorga, Reuel Boomaniel, MD  ?pantoprazole (PROTONIX) 40 MG tablet Take 1 tablet (40 mg total) by mouth 2 (two) times daily. 06/29/21  Yes Erick BlinksMemon, Jehanzeb, MD  ?sucralfate (CARAFATE) 1 g tablet Take 1 tablet (1 g total) by mouth 4 (four) times daily. 06/29/21 06/29/22 Yes Erick BlinksMemon, Jehanzeb, MD  ?hydrOXYzine (ATARAX) 25 MG tablet Take 1 tablet (25 mg total) by mouth 3 (three) times daily as needed for anxiety. ?Patient not taking: Reported on 08/15/2021 06/29/21   Erick BlinksMemon, Jehanzeb, MD  ?lidocaine (XYLOCAINE) 2 % solution Use as directed 15 mLs in the mouth or throat every 4 (four) hours  as needed for mouth pain. ?Patient not taking: Reported on 08/15/2021 07/29/21   Dolores Frame, MD  ?magnesium oxide (MAG-OX) 400 (240 Mg) MG tablet Take 1 tablet (400 mg total) by mouth 2 (two) times daily. ?Patient not taking: Reported on 07/13/2021 06/29/21   Erick Blinks, MD  ? ? ?Physical  Exam: ?Vitals:  ? 08/15/21 1030 08/15/21 1045 08/15/21 1114 08/15/21 1118  ?BP:    (!) 79/55  ?Pulse: (!) 44  88 87  ?Resp: (!) 21  20 13   ?Temp:  99.3 ?F (37.4 ?C)    ?TempSrc:  Oral    ?SpO2: 97%  100% 100%  ?Weight:      ?Height:      ? ?General exam: Alert, awake, oriented x 3; chronically ill in appearance; complaining of feeling nauseous and being severely hungry.  No vomiting seen at time of examination.  Patient is afebrile and is asking for pain medication due to abdominal discomfort. ?Respiratory system: Clear to auscultation. Respiratory effort normal.  Good saturation no using accessory muscle. ?Cardiovascular system: Sinus tachycardia, no rubs, no gallops, no JVD. ?Gastrointestinal system: Abdomen is nondistended, soft and diffusely vague discomfort on palpation appreciated.  Positive bowel sounds.   ?Central nervous system: Alert and oriented. No new focal neurological deficits. ?Extremities: No cyanosis or clubbing. ?Skin: No petechiae. ?Psychiatry: Judgement and insight appear normal. Mood & affect appropriate.  ? ?Data Reviewed: ?Complete metabolic panel demonstrating a sodium of 127, potassium less than 2, chloride less than 65, CO2 more than 45, BUN 30, creatinine 1.29 AST 22 and ALT 12. ?Respiratory panel with negative COVID and influenza PCR ?CBC with WBCs of 9.1, hemoglobin 12.0, platelet count 315. ?Magnesium 2.5 ?Lactic acid 2.2 ?UDS positive for cocaine ? ?Assessment and Plan: ?* Intractable nausea and vomiting ?- In the setting of gastroesophageal flux disease, drug abuse and possible cannabinoid induced hyperemesis. ?-Drug cessation counseling provided. ?-Will slowly advance diet as tolerated ?-Aggressive fluid resuscitation and electrolyte repletion ?-IV PPI twice a day will be provided. ?-Patient with multiple similar admissions for the same problem. ? ?Hyponatremia ?- In the setting of decreased oral intake and dehydration ?-Continue providing aggressive fluid resuscitation and follow  electrolytes trend ?-Slowly advance diet. ? ?Hypokalemia ?- Due to ongoing GI losses and decreased oral intake. ?-Will provide electrolyte repletion and follow trend. ?-Telemetry monitoring. ?-Potassium < 2 at time of admission. ? ?Drug abuse (HCC) ?- Polysubstance abuse ?-Cessation counseling provided ?-TOC has been notified to provide outpatient resources. ? ?Prolonged QT interval ?- Avoid medications that can further prolong QT ?-Telemetry monitoring ?-Replete electrolytes ? ? ?Tobacco abuse ?-Cessation counseling provided ?-Will provide nicotine patch. ? ?Gastroesophageal reflux disease ?-With history of gastric outlet obstruction and peptic ulcer in the past ?-Continue IV PPI and the use of Carafate. ?-No reports of hematemesis or melena currently ?-Hemoglobin appears stable. ?-If needed will consult GI service. ? ?Severe protein calorie malnutrition ?-Body mass index is 17.94 kg/m?. ?-Provide feeding supplements and encourage appropriate oral nutritional intake. ? ?Lactic acidosis ?-In the setting of dehydration ?-Will provide aggressive fluid resuscitation ?-Follow lactic acid. ?-At time of admission lactic acid 2.2. ? ?Acute kidney injury ?-Patient reports no complaints of UTI retention or dysuria ?-Appears to be in the setting of prerenal azotemia and dehydration ?-Minimize nephrotoxic agents, contrast and hypotension. ?-Provide fluid resuscitation ?-Follow renal function trend. ? ? ? Advance Care Planning:   Code Status: Full Code  ? ?Consults: None ? ?Family Communication: No family at bedside. ? ?Severity of Illness: ?  The appropriate patient status for this patient is INPATIENT. Inpatient status is judged to be reasonable and necessary in order to provide the required intensity of service to ensure the patient's safety. The patient's presenting symptoms, physical exam findings, and initial radiographic and laboratory data in the context of their chronic comorbidities is felt to place them at high risk  for further clinical deterioration. Furthermore, it is not anticipated that the patient will be medically stable for discharge from the hospital within 2 midnights of admission.  ? ?* I certify that at the point of

## 2021-08-15 NOTE — Assessment & Plan Note (Addendum)
-   Polysubstance abuse ?-Cessation counseling provided ?-TOC has been notified to provide outpatient resources. ?-Suboxone detox protocol has been initiated. ?

## 2021-08-15 NOTE — Assessment & Plan Note (Signed)
-   Avoid medications that can further prolong QT ?-Telemetry monitoring ?-Replete electrolytes ? ?

## 2021-08-15 NOTE — Assessment & Plan Note (Signed)
-   In the setting of decreased oral intake and dehydration ?-Continue providing aggressive fluid resuscitation and follow electrolytes trend ?-Slowly advance diet. ?

## 2021-08-15 NOTE — ED Notes (Signed)
MD messaged about changing diet order.  Still waiting on a response ?

## 2021-08-15 NOTE — ED Provider Notes (Signed)
?[facility] EMERGENCY DEPARTMENT ?Provider Note ? ? ?CSN: [id number] ?Arrival date & time: 08/15/21  0620 ? ?  ? ?History ? ?Chief Complaint  ?Patient presents with  ? Fall  ? ? ?Kristina Cardenas is a 46 y.o. female. ? ?HPI ? ?  ? ?46 year old female comes in with chief complaint of falls, weakness, nausea and vomiting.  Patient has history of GERD, polysubstance use, stomach ulcers. ? ?Patient indicates that she started having nausea and vomiting 2 days ago.  She suspects that she is having at least 5 episodes of emesis.  It is yellow, green in color.  She has not noticed any blood in her emesis.  She is having epigastric chest pain and thinks that she is having GERD. ? ?Review of system is negative for diarrhea, lower abdominal pain, fevers or chills. ? ?Home Medications ?Prior to Admission medications   ?Medication Sig Start Date End Date Taking? Authorizing Provider  ?acetaminophen (TYLENOL) 500 MG tablet Take 2 tablets (1,000 mg total) by mouth every 6 (six) hours as needed. 06/29/21  Yes Erick Blinks, MD  ?ondansetron (ZOFRAN-ODT) 4 MG disintegrating tablet Take 1 tablet (4 mg total) by mouth every 8 (eight) hours as needed for nausea or vomiting. 07/29/21  Yes Marguerita Merles, Reuel Boom, MD  ?pantoprazole (PROTONIX) 40 MG tablet Take 1 tablet (40 mg total) by mouth 2 (two) times daily. 06/29/21  Yes Erick Blinks, MD  ?sucralfate (CARAFATE) 1 g tablet Take 1 tablet (1 g total) by mouth 4 (four) times daily. 06/29/21 06/29/22 Yes Erick Blinks, MD  ?hydrOXYzine (ATARAX) 25 MG tablet Take 1 tablet (25 mg total) by mouth 3 (three) times daily as needed for anxiety. ?Patient not taking: Reported on 08/15/2021 06/29/21   Erick Blinks, MD  ?lidocaine (XYLOCAINE) 2 % solution Use as directed 15 mLs in the mouth or throat every 4 (four) hours as needed for mouth pain. ?Patient not taking: Reported on 08/15/2021 07/29/21   Dolores Frame, MD  ?magnesium oxide (MAG-OX) 400 (240 Mg) MG tablet Take 1 tablet (400 mg  total) by mouth 2 (two) times daily. ?Patient not taking: Reported on 07/13/2021 06/29/21   Erick Blinks, MD  ?   ? ?Allergies    ?Sulfa antibiotics and Toradol [ketorolac tromethamine]   ? ?Review of Systems   ?Review of Systems  ?All other systems reviewed and are negative. ? ?Physical Exam ?Updated Vital Signs ?BP (S) (!) 84/51 Comment: MD notified  Pulse (!) 44   Temp 99.3 ?F (37.4 ?C) (Oral)   Resp (!) 21   Ht 5\' 8"  (1.727 m)   Wt 53.5 kg   SpO2 97%   BMI 17.94 kg/m?  ?Physical Exam ?Vitals and nursing note reviewed.  ?Constitutional:   ?   Appearance: She is well-developed.  ?   Comments: Somnolent  ?HENT:  ?   Head: Atraumatic.  ?Cardiovascular:  ?   Rate and Rhythm: Normal rate.  ?Pulmonary:  ?   Effort: Pulmonary effort is normal.  ?   Breath sounds: No wheezing or rhonchi.  ?Abdominal:  ?   Palpations: Abdomen is soft.  ?   Tenderness: There is no abdominal tenderness.  ?Musculoskeletal:  ?   Cervical back: Neck supple.  ?Skin: ?   General: Skin is warm and dry.  ?Neurological:  ?   Mental Status: She is oriented to person, place, and time.  ? ? ?ED Results / Procedures / Treatments   ?Labs ?(all labs ordered are listed, but only abnormal results  are displayed) ?Labs Reviewed  ?COMPREHENSIVE METABOLIC PANEL - Abnormal; Notable for the following components:  ?    Result Value  ? Sodium 127 (*)   ? Potassium <2.0 (*)   ? Chloride <65 (*)   ? CO2 >45 (*)   ? Glucose, Bld 122 (*)   ? BUN 30 (*)   ? Creatinine, Ser 1.29 (*)   ? Calcium 8.8 (*)   ? Albumin 3.2 (*)   ? GFR, Estimated 52 (*)   ? All other components within normal limits  ?MAGNESIUM - Abnormal; Notable for the following components:  ? Magnesium 2.5 (*)   ? All other components within normal limits  ?LACTIC ACID, PLASMA - Abnormal; Notable for the following components:  ? Lactic Acid, Venous 2.2 (*)   ? All other components within normal limits  ?LACTIC ACID, PLASMA - Abnormal; Notable for the following components:  ? Lactic Acid, Venous 2.5  (*)   ? All other components within normal limits  ?BLOOD GAS, VENOUS - Abnormal; Notable for the following components:  ? pH, Ven 7.61 (*)   ? pO2, Ven 46 (*)   ? Bicarbonate 59.3 (*)   ? Acid-Base Excess 32.5 (*)   ? All other components within normal limits  ?RESP PANEL BY RT-PCR (FLU A&B, COVID) ARPGX2  ?CBC  ?RAPID URINE DRUG SCREEN, HOSP PERFORMED  ?TYPE AND SCREEN  ? ? ?EKG ?EKG Interpretation ? ?Date/Time:  Monday August 15 2021 [id number] EDT ?Ventricular Rate:  108 ?PR Interval:  175 ?QRS Duration: 88 ?QT Interval:  415 ?QTC Calculation: 467 ?R Axis:   85 ?Text Interpretation: Sinus tachycardia Ventricular bigeminy Consider left atrial enlargement Probable anteroseptal infarct, old ST depression in inferior and lateral leads Confirmed by Derwood KaplanNanavati, Jadin Creque [phone]([id number]) on 08/15/2021 7:20:28 AM ? ?Radiology ?DG Chest Port 1 View ? ?Result Date: 08/15/2021 ?CLINICAL DATA:  Cough EXAM: PORTABLE CHEST 1 VIEW COMPARISON:  07/24/2021 FINDINGS: The heart size and mediastinal contours are within normal limits. Both lungs are clear. No pleural effusion. Chronic right rib fractures. IMPRESSION: No acute process in the chest. Electronically Signed   By: Guadlupe SpanishPraneil  Patel M.D.   On: 08/15/2021 10:10   ? ?Procedures ?Marland Kitchen.Critical Care ?Performed by: Derwood KaplanNanavati, Adhya Cocco, MD ?Authorized by: Derwood KaplanNanavati, Camara Renstrom, MD  ? ?Critical care provider statement:  ?  Critical care time (minutes):  81 ?  Critical care was necessary to treat or prevent imminent or life-threatening deterioration of the following conditions:  Circulatory failure ?  Critical care was time spent personally by me on the following activities:  Development of treatment plan with patient or surrogate, discussions with consultants, evaluation of patient's response to treatment, examination of patient, ordering and review of laboratory studies, ordering and review of radiographic studies, ordering and performing treatments and interventions, pulse oximetry, re-evaluation of patient's  condition and review of old charts  ? ? ?Medications Ordered in ED ?Medications  ?sodium chloride 0.9 % 1,000 mL with potassium chloride 60 mEq infusion ( Intravenous New Bag/Given 08/15/21 0904)  ?buprenorphine-naloxone (SUBOXONE) 2-0.5 mg per SL tablet 2 tablet (has no administration in time range)  ?lactated ringers bolus 1,000 mL (0 mLs Intravenous Stopped 08/15/21 0817)  ?pantoprazole (PROTONIX) injection 40 mg (40 mg Intravenous Given 08/15/21 0733)  ?alum & mag hydroxide-simeth (MAALOX/MYLANTA) 200-200-20 MG/5ML suspension 30 mL (30 mLs Oral Given 08/15/21 0733)  ? ? ?ED Course/ Medical Decision Making/ A&P ?Clinical Course as of 08/15/21 1058  ?Mon Aug 15, 2021  ?1045 Patient reassessed on 3  separate occasions.  Initially because she was requesting pain meds.  More recently to go over her results.  She is not vomiting anymore which is reassuring.  She remains low but on the hypotension side, however patient has no white count, no lactic acidosis and she has become more alert with time.  I do not think pressors are needed.  I have reviewed patient's previous visits, her blood pressures frequently are in the 80s systolic. [AN]  ?1046 Potassium(!!): <2.0 ?Chloride with 60 mEq of potassium ordered. ?We will recheck potassium there after and she will likely need more K.  Magnesium is normal. [AN]  ?1046 pH, Ven(!!): 7.61 ?Patient has hypochloremia, hyponatremia and metabolic alkalosis.  In her case, metabolic alkalosis is likely related to chronic vomiting and also severe hypokalemia.  [AN]  ?1054 Results discussed with the patient.  She wants something for pain.  Discussed with her with that in the setting of low blood pressure, significant electrolyte derangement, we will not be giving her any narcotic medicine.  I have however initiated Suboxone protocol for her opiate withdrawals.  Patient is happy to hear about that but still wants fentanyl.  I again asked her if she would like Tylenol or Toradol, which she  declines.  She was made aware that I do not think fentanyl is safe for her at this point. ? ?She has left AMA multiple times in the past including twice in March.  Indicates that she wants to be admitted right now

## 2021-08-15 NOTE — ED Notes (Signed)
Patient demanding pain medication for epigastric pain and wanting narcotics  Explained her BP is too low and the meds he is giving are appropriate.  Requesting lidocaine with GI cocktail and requesting to speak to the MD ? ?

## 2021-08-15 NOTE — ED Notes (Signed)
Patient sitting on edge of bed complaining of pain and wanting the suboxone, called pharmacy to bring.  Patient requesting ice chips.   ?

## 2021-08-15 NOTE — Assessment & Plan Note (Signed)
-   Due to ongoing GI losses and decreased oral intake. ?-Will provide electrolyte repletion and follow trend. ?-Telemetry monitoring. ?-Potassium < 2 at time of admission. ?

## 2021-08-15 NOTE — ED Notes (Signed)
Called [facility] to bring soups for patient due to not receiving from dietary.  ?

## 2021-08-15 NOTE — Assessment & Plan Note (Signed)
-  Cessation counseling provided ?-Will provide nicotine patch. ?

## 2021-08-15 NOTE — ED Notes (Signed)
Patient sleeping sitting up on side of bed.  NAD noted.  ?

## 2021-08-15 NOTE — Assessment & Plan Note (Signed)
-  With history of gastric outlet obstruction and peptic ulcer in the past ?-Continue IV PPI and the use of Carafate. ?-No reports of hematemesis or melena currently ?-Hemoglobin appears stable. ?-If needed will consult GI service. ?

## 2021-08-15 NOTE — ED Triage Notes (Signed)
BIB [facility] c/o fall. No injuries. Ambulatory from stretcher. Also c/o nausea, requesting GI cocktail.  ?

## 2021-08-15 NOTE — ED Notes (Signed)
Provided patient with jello, ginger ale and ice chips. ? ?

## 2021-08-15 NOTE — ED Notes (Signed)
Patient awake and requesting regular food and something else for pain.  Explained again she can't have anything else at this time other than tylenol and she refused that.  ?

## 2021-08-15 NOTE — Assessment & Plan Note (Signed)
-   In the setting of gastroesophageal flux disease, drug abuse and possible cannabinoid induced hyperemesis. ?-Drug cessation counseling provided. ?-Will slowly advance diet as tolerated ?-Aggressive fluid resuscitation and electrolyte repletion ?-IV PPI twice a day will be provided. ?-Patient with multiple similar admissions for the same problem. ?

## 2021-08-16 MED ORDER — DIPHENHYDRAMINE HCL 50 MG/ML IJ SOLN
25.0000 mg | Freq: Once | INTRAMUSCULAR | Status: DC
Start: 2021-08-16 — End: 2021-08-16

## 2021-08-16 MED ORDER — GABAPENTIN 300 MG PO CAPS: 300.0000 mg | ORAL_CAPSULE | Freq: Once | ORAL | Status: DC

## 2021-08-16 NOTE — ED Notes (Signed)
Pt stated that she was in pain. BP too low to adm fentanyl and pt refused actm ?

## 2021-08-16 NOTE — Discharge Summary (Signed)
?Physician Discharge Summary ?  ?Patient: Kristina Cardenas MRN: [id number] DOB: Jul 21, 1975  ?Admit date:     08/15/2021  ?Discharge date: 08/16/21  ?Discharge Physician: Barton Dubois  ? ?PCP: Pcp, No  ? ?Recommendations at discharge:  ?Patient left AGAINST MEDICAL ADVICE. ? ?Discharge Diagnoses: ?Principal Problem: ?  Intractable nausea and vomiting ?Active Problems: ?  Hyponatremia ?  Hypokalemia ?  Gastroesophageal reflux disease ?  Tobacco abuse ?  Prolonged QT interval ?  Drug abuse ([place]) ? ? ?HPI: Shannell Maricle is a 46 y.o. female with medical history significant of gastroesophageal reflux disease, gastric outlet obstruction, peptic ulcer, prior history of Bell's palsy, prior history of stroke, polysubstance abuse (including heroin, narcotics and tobacco) and chronic abdominal pain; who presented to the emergency department secondary to abdominal pain and intractable nausea and vomiting.  Patient reports that for the last 3 days she has not been able to keep anything down and has been experiencing 3-5 episodes of vomiting.  No hematemesis, no melena, no dysuria, no hematuria, no chest pain, no shortness of breath, no fever. ?Patient reports no sick contacts. ?  ?COVID PCR negative. ?  ?On arrival to the emergency department patient was found to be hypotensive (not exactly new for her), significantly dehydrated and with electrolytes abnormalities demonstrating hyponatremia, hypochloremia and hypokalemia.  There was also presence of acute kidney injury with a creatinine of 1.29 and a BUN of 30.  Aggressive fluid resuscitation and electrolyte repletion has been initiated; [facility] has been called to place patient in the hospital for further evaluation and management. ?  ? ?Assessment and Plan: ?* Intractable nausea and vomiting ?- In the setting of gastroesophageal flux disease, drug abuse and possible cannabinoid induced hyperemesis. ?-Drug cessation counseling provided. ?-Will slowly advance diet as  tolerated ?-Aggressive fluid resuscitation and electrolyte repletion ?-IV PPI twice a day will be provided. ?-Patient with multiple similar admissions for the same problem. ? ?Hyponatremia ?- In the setting of decreased oral intake and dehydration ?-Continue providing aggressive fluid resuscitation and follow electrolytes trend ?-Slowly advance diet. ? ?Hypokalemia ?- Due to ongoing GI losses and decreased oral intake. ?-Will provide electrolyte repletion and follow trend. ?-Telemetry monitoring. ?-Potassium < 2 at time of admission. ? ?Drug abuse ([place]) ?- Polysubstance abuse ?-Cessation counseling provided ?-[facility] has been notified to provide outpatient resources. ?-Suboxone detox protocol has been initiated. ? ?Prolonged QT interval ?- Avoid medications that can further prolong QT ?-Telemetry monitoring ?-Replete electrolytes ? ? ?Tobacco abuse ?-Cessation counseling provided ?-Will provide nicotine patch. ? ?Gastroesophageal reflux disease ?-With history of gastric outlet obstruction and peptic ulcer in the past ?-Continue IV PPI and the use of Carafate. ?-No reports of hematemesis or melena currently ?-Hemoglobin appears stable. ?-If needed will consult GI service. ? ? ?Consultants: None ?Procedures performed: See below for x-ray reports  ?Disposition: Patient left AGAINST MEDICAL ADVICE ? ?DISCHARGE MEDICATION: ?Allergies as of 08/16/2021   ? ?   Reactions  ? Sulfa Antibiotics   ? Toradol [ketorolac Tromethamine]   ? ?  ? ?  ?Medication List  ?  ? ?ASK your doctor about these medications   ? ?acetaminophen 500 MG tablet ?Commonly known as: TYLENOL ?Take 2 tablets (1,000 mg total) by mouth every 6 (six) hours as needed. ?  ?hydrOXYzine 25 MG tablet ?Commonly known as: ATARAX ?Take 1 tablet (25 mg total) by mouth 3 (three) times daily as needed for anxiety. ?  ?lidocaine 2 % solution ?Commonly known as: XYLOCAINE ?Use as directed 15 mLs  in the mouth or throat every 4 (four) hours as needed for mouth pain. ?   ?magnesium oxide 400 (240 Mg) MG tablet ?Commonly known as: MAG-OX ?Take 1 tablet (400 mg total) by mouth 2 (two) times daily. ?  ?ondansetron 4 MG disintegrating tablet ?Commonly known as: ZOFRAN-ODT ?Take 1 tablet (4 mg total) by mouth every 8 (eight) hours as needed for nausea or vomiting. ?  ?pantoprazole 40 MG tablet ?Commonly known as: PROTONIX ?Take 1 tablet (40 mg total) by mouth 2 (two) times daily. ?  ?sucralfate 1 g tablet ?Commonly known as: Carafate ?Take 1 tablet (1 g total) by mouth 4 (four) times daily. ?  ? ?  ? ? ?Discharge Exam: ?Filed Weights  ? 08/15/21 0624  ?Weight: 53.5 kg  ? ?Patient left AGAINST MEDICAL ADVICE around 3 AM in the morning prior to be seen again.  Please refer to physical examination done at time of admission for any details on presentation. ? ?Condition at discharge: Patient left AGAINST MEDICAL ADVICE. ? ?The results of significant diagnostics from this hospitalization (including imaging, microbiology, ancillary and laboratory) are listed below for reference.  ? ?Imaging Studies: ?DG Chest 2 View ? ?Result Date: 07/24/2021 ?CLINICAL DATA:  Hypoxia. EXAM: CHEST - 2 VIEW COMPARISON:  Chest radiograph dated 07/13/2021. FINDINGS: No focal consolidation, pleural effusion or pneumothorax. The cardiac silhouette is within normal limits. No acute osseous pathology. Degenerative changes of the spine. Old healed right rib fractures. IMPRESSION: No active cardiopulmonary disease. Electronically Signed   By: Anner Crete M.D.   On: 07/24/2021 21:19  ? ?DG Chest Port 1 View ? ?Result Date: 08/15/2021 ?CLINICAL DATA:  Cough EXAM: PORTABLE CHEST 1 VIEW COMPARISON:  07/24/2021 FINDINGS: The heart size and mediastinal contours are within normal limits. Both lungs are clear. No pleural effusion. Chronic right rib fractures. IMPRESSION: No acute process in the chest. Electronically Signed   By: Macy Mis M.D.   On: 08/15/2021 10:10   ? ?Microbiology: ?Results for orders placed or  performed during the hospital encounter of 08/15/21  ?Resp Panel by RT-PCR (Flu A&B, Covid) Nasopharyngeal Swab     Status: None  ? Collection Time: 08/15/21  7:19 AM  ? Specimen: Nasopharyngeal Swab; Nasopharyngeal(NP) swabs in vial transport medium  ?Result Value Ref Range Status  ? SARS Coronavirus 2 by RT PCR NEGATIVE NEGATIVE Final  ?  Comment: (NOTE) ?SARS-CoV-2 target nucleic acids are NOT DETECTED. ? ?The SARS-CoV-2 RNA is generally detectable in upper respiratory ?specimens during the acute phase of infection. The lowest ?concentration of SARS-CoV-2 viral copies this assay can detect is ?138 copies/mL. A negative result does not preclude SARS-Cov-2 ?infection and should not be used as the sole basis for treatment or ?other patient management decisions. A negative result may occur with  ?improper specimen collection/handling, submission of specimen other ?than nasopharyngeal swab, presence of viral mutation(s) within the ?areas targeted by this assay, and inadequate number of viral ?copies(<138 copies/mL). A negative result must be combined with ?clinical observations, patient history, and epidemiological ?information. The expected result is Negative. ? ?Fact Sheet for Patients:  ?EntrepreneurPulse.com.au ? ?Fact Sheet for Healthcare Providers:  ?IncredibleEmployment.be ? ?This test is no t yet approved or cleared by the [place] FDA and  ?has been authorized for detection and/or diagnosis of SARS-CoV-2 by ?FDA under an Emergency Use Authorization (EUA). This EUA will remain  ?in effect (meaning this test can be used) for the duration of the ?COVID-19 declaration under Section 564(b)(1) of the  Act, 21 ?U.S.C.section 360bbb-3(b)(1), unless the authorization is terminated  ?or revoked sooner.  ? ? ?  ? Influenza A by PCR NEGATIVE NEGATIVE Final  ? Influenza B by PCR NEGATIVE NEGATIVE Final  ?  Comment: (NOTE) ?The Xpert Xpress SARS-CoV-2/FLU/RSV plus assay is intended as  an aid ?in the diagnosis of influenza from Nasopharyngeal swab specimens and ?should not be used as a sole basis for treatment. Nasal washings and ?aspirates are unacceptable for Xpert Xpress SARS-CoV-2/FLU/RSV ?testing. ? ?Fac

## 2021-08-16 NOTE — ED Notes (Signed)
Pt was offered ACTM for pain. Pt refused. Pt bp was continuously too low to give Fentanyl to safely.  ?

## 2021-08-16 NOTE — ED Notes (Signed)
Pt bp continuously too low to safely adm Fentanyl to for pain. Pt was offered ACTM per PRN orders and refused. Called MD to report that pt was threatening to leave with out opioid pain medication. Pt specifically stated Opioid pain medication. MD ordered gabapentin and IV benadryl. Pt refused all medication and again stated that she would leave without opioid pain medication. This nurse thoroughly explained to the patient all the dangers of low potassium ex  cardiac dysrhythmias including up to cardiac arrest. Pt stated that she understood the dangers. Pt then signed the AMA form and left.  ?

## 2021-08-18 ENCOUNTER — Encounter (INDEPENDENT_AMBULATORY_CARE_PROVIDER_SITE_OTHER): Payer: Self-pay

## 2021-08-29 ENCOUNTER — Ambulatory Visit (INDEPENDENT_AMBULATORY_CARE_PROVIDER_SITE_OTHER): Payer: Medicaid Other | Admitting: Gastroenterology

## 2021-08-29 ENCOUNTER — Encounter (INDEPENDENT_AMBULATORY_CARE_PROVIDER_SITE_OTHER): Payer: Self-pay | Admitting: Gastroenterology

## 2021-08-29 DEATH — deceased

## 2022-08-07 IMAGING — DX DG CHEST 2V
2 series · 2 of 2 positions shown · non-contrast
Comparison: Chest radiograph dated 07/13/2021.

CLINICAL DATA: Hypoxia.

EXAM:
CHEST - 2 VIEW

[chest lat]
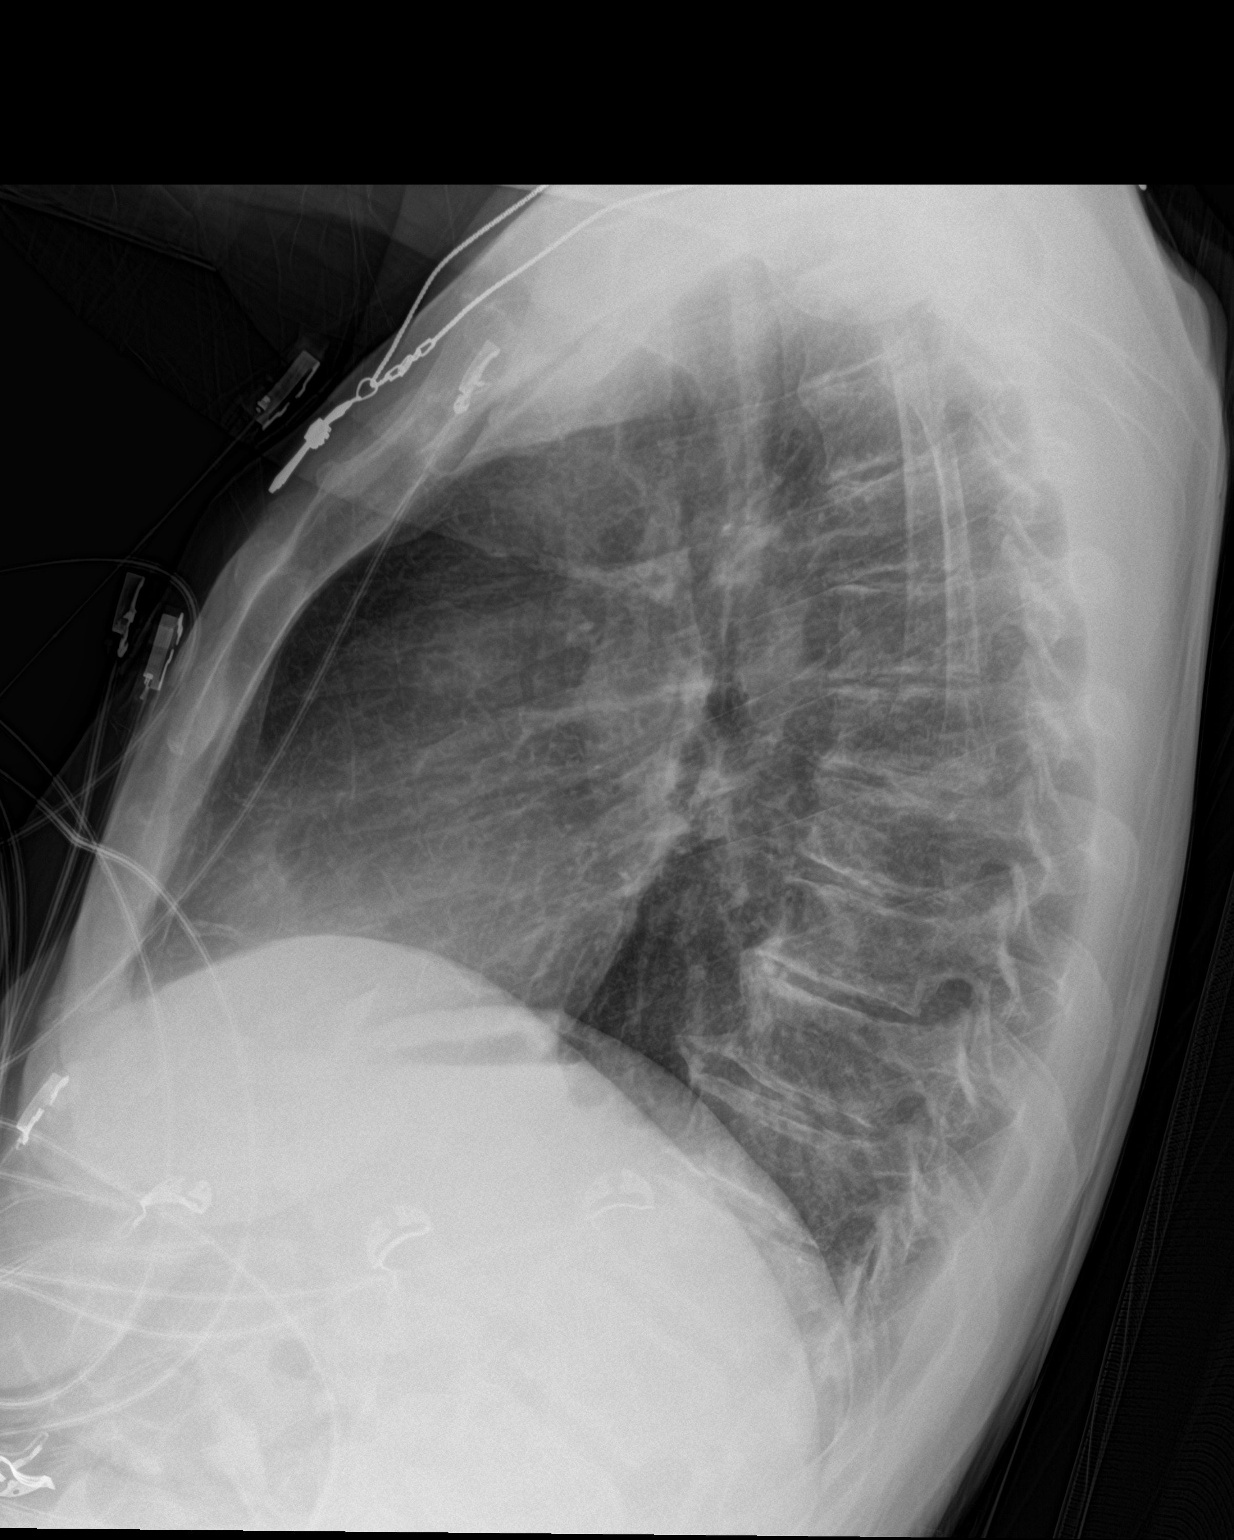

[chest ap]
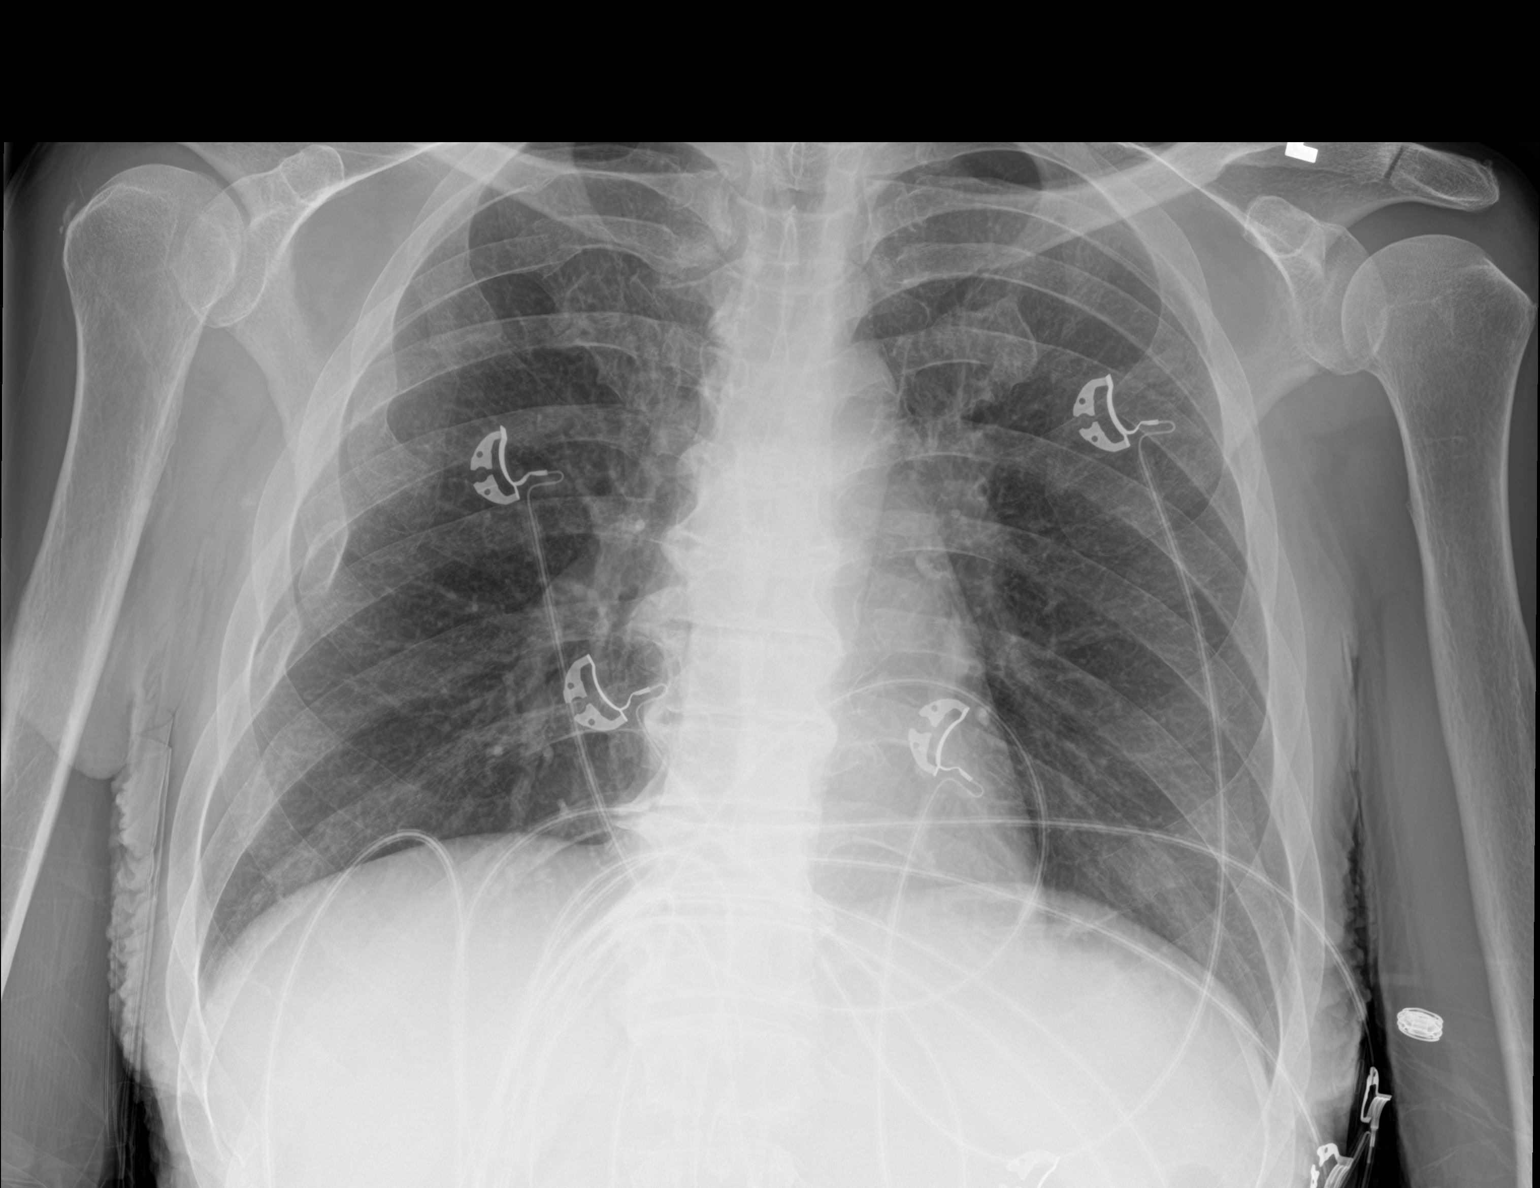

[2 of 2 positions shown; findings below may reference images not displayed]

FINDINGS: No focal consolidation, pleural effusion or pneumothorax. The
cardiac silhouette is within normal limits. No acute osseous
pathology. Degenerative changes of the spine. Old healed right rib
fractures.
IMPRESSION: No active cardiopulmonary disease.

## 2022-08-29 IMAGING — DX DG CHEST 1V PORT
1 series · 1 of 1 positions shown · non-contrast
Comparison: 07/24/2021

CLINICAL DATA: Cough

EXAM:
PORTABLE CHEST 1 VIEW

[chest ap]
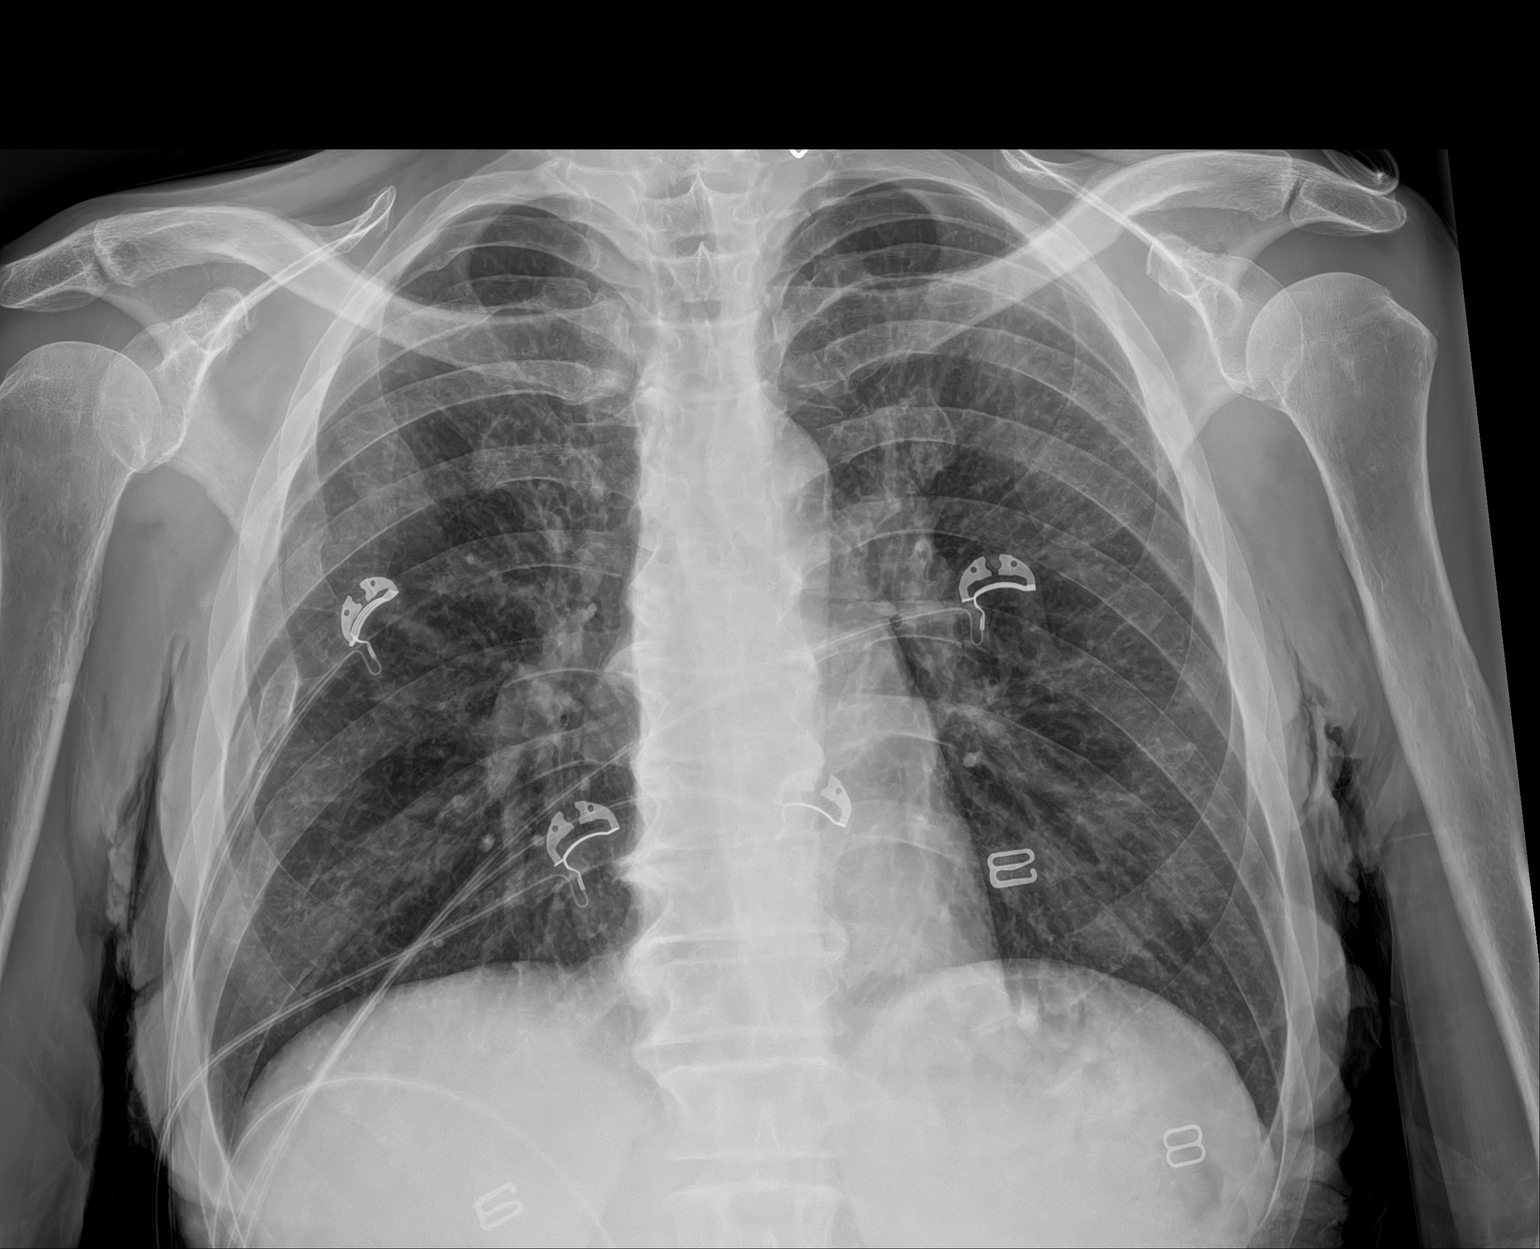

[1 of 1 positions shown; findings below may reference images not displayed]

FINDINGS: The heart size and mediastinal contours are within normal limits.
Both lungs are clear. No pleural effusion. Chronic right rib
fractures.
IMPRESSION: No acute process in the chest.
